# Patient Record
Sex: Female | Born: 1980
Health system: Southern US, Community
[De-identification: ages and names within clinical notes are randomized; demographics above are authoritative.]

## PROBLEM LIST (undated history)

## (undated) ENCOUNTER — Inpatient Hospital Stay (HOSPITAL_COMMUNITY): Payer: Self-pay

## (undated) ENCOUNTER — Emergency Department: Discharge status: Absent without leave | Payer: Self-pay

## (undated) DIAGNOSIS — G43909 Migraine, unspecified, not intractable, without status migrainosus: Secondary | ICD-10-CM

## (undated) DIAGNOSIS — E039 Hypothyroidism, unspecified: Secondary | ICD-10-CM

## (undated) DIAGNOSIS — F32A Depression, unspecified: Secondary | ICD-10-CM

## (undated) DIAGNOSIS — O1490 Unspecified pre-eclampsia, unspecified trimester: Secondary | ICD-10-CM

## (undated) DIAGNOSIS — F419 Anxiety disorder, unspecified: Secondary | ICD-10-CM

## (undated) DIAGNOSIS — F329 Major depressive disorder, single episode, unspecified: Secondary | ICD-10-CM

## (undated) HISTORY — PX: TONSILECTOMY/ADENOIDECTOMY WITH MYRINGOTOMY: SHX6125

## (undated) HISTORY — DX: Major depressive disorder, single episode, unspecified: F32.9

## (undated) HISTORY — DX: Depression, unspecified: F32.A

## (undated) HISTORY — DX: Anxiety disorder, unspecified: F41.9

## (undated) HISTORY — DX: Migraine, unspecified, not intractable, without status migrainosus: G43.909

## (undated) HISTORY — DX: Unspecified pre-eclampsia, unspecified trimester: O14.90

---

## 2004-07-18 ENCOUNTER — Encounter: Admission: RE | Admit: 2004-07-18 | Discharge: 2004-07-18 | Payer: Self-pay | Admitting: Pediatrics

## 2005-03-28 ENCOUNTER — Other Ambulatory Visit: Admission: RE | Admit: 2005-03-28 | Discharge: 2005-03-28 | Payer: Self-pay | Admitting: *Deleted

## 2006-05-30 ENCOUNTER — Other Ambulatory Visit: Admission: RE | Admit: 2006-05-30 | Discharge: 2006-05-30 | Payer: Self-pay | Admitting: Family Medicine

## 2006-08-29 ENCOUNTER — Emergency Department (HOSPITAL_COMMUNITY): Admission: EM | Admit: 2006-08-29 | Discharge: 2006-08-29 | Payer: Self-pay | Admitting: Emergency Medicine

## 2006-09-16 ENCOUNTER — Encounter: Admission: RE | Admit: 2006-09-16 | Discharge: 2006-09-16 | Payer: Self-pay | Admitting: Gastroenterology

## 2007-06-01 ENCOUNTER — Other Ambulatory Visit: Admission: RE | Admit: 2007-06-01 | Discharge: 2007-06-01 | Payer: Self-pay | Admitting: Family Medicine

## 2008-01-01 IMAGING — CT CT ENTERO ABD W/CM
2 of 5 series · 17 of 46 positions shown, 19 images · IV contrast (VOLUMEN & [ID] OMNI 300)
Comparison: None.

ABDOMEN CT WITH CONTRAST: (Performed using CT Enterography protocol)

CLINICAL DATA: Abdominal pain. Question Crohn's disease.
TECHNIQUE: Multidetector CT imaging of the abdomen and pelvis was performed
following administration of 6882cc of VoLumen low attenuation enteric contrast. 
Images were acquired after bolus injection of nonionic intravenous contrast with
coronal and sagittal reformations reviewed for assessment of the small bowel
mucosa.

Contrast:  100 cc Omnipaque 300

[Series 4: enterography · axial · 0.62mm/px · z∈[-436,+32]mm · 14 of 186 slices shown, 16 images]
[im 8/186  soft-tissue]
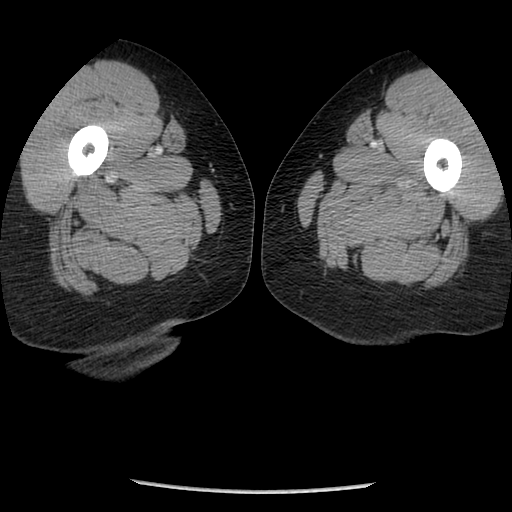
[im 8/186  bone]
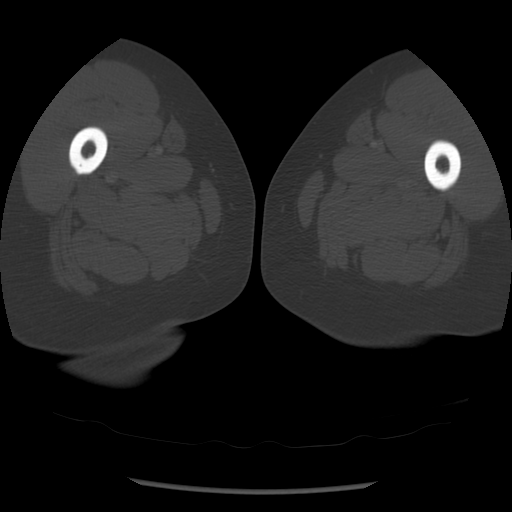
[im 24/186  soft-tissue]
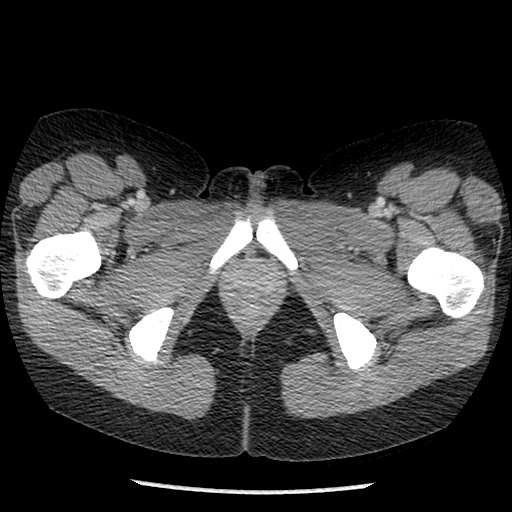
[im 39/186  soft-tissue]
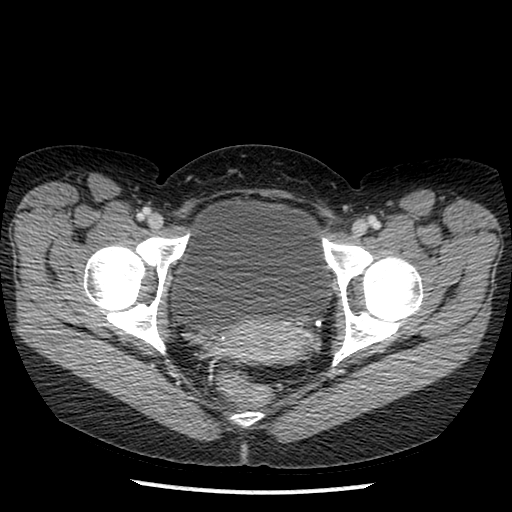
[im 47/186  soft-tissue]
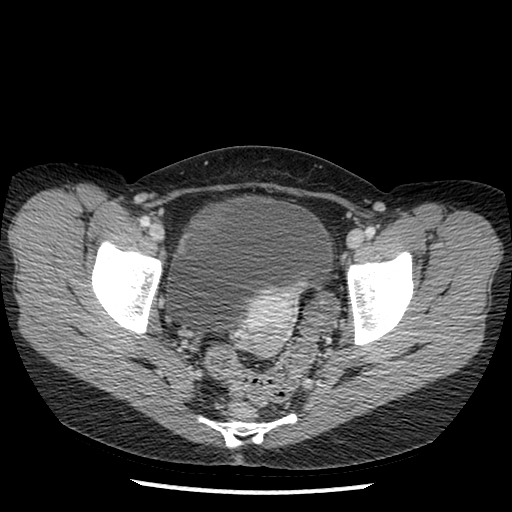
[im 62/186  soft-tissue]
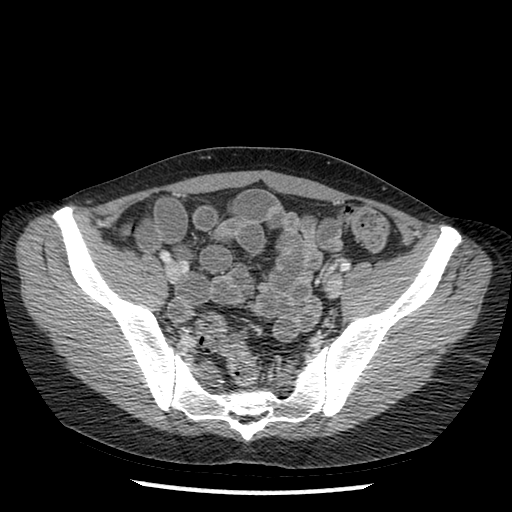
[im 78/186  soft-tissue]
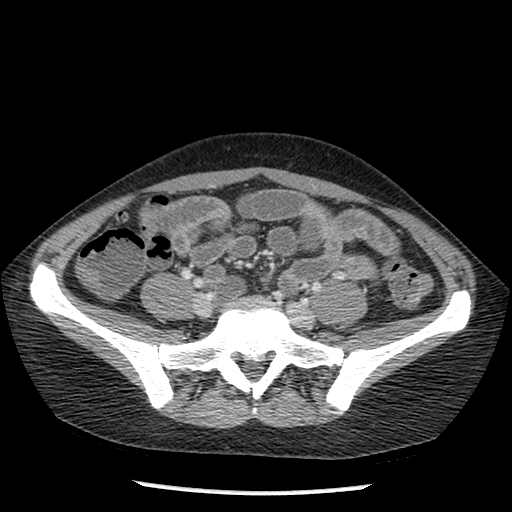
[im 85/186  soft-tissue]
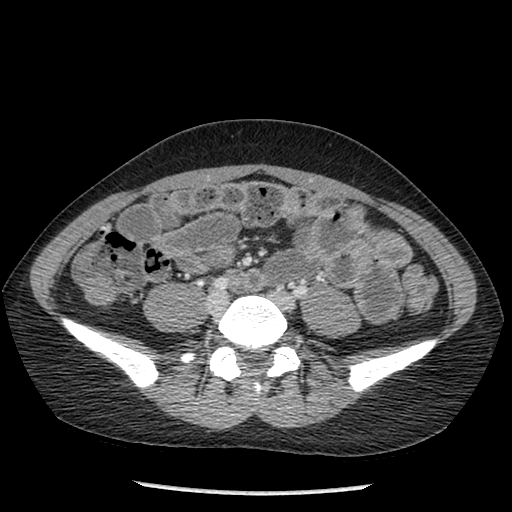
[im 101/186  soft-tissue]
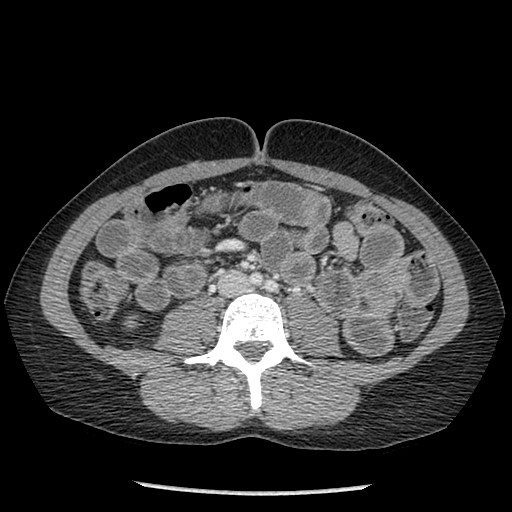
[im 108/186  soft-tissue]
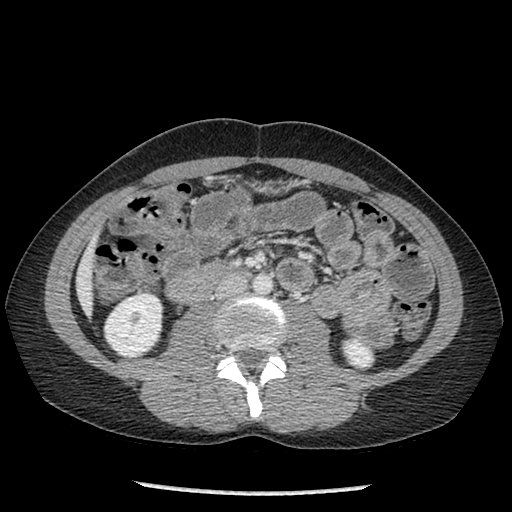
[im 108/186  bone]
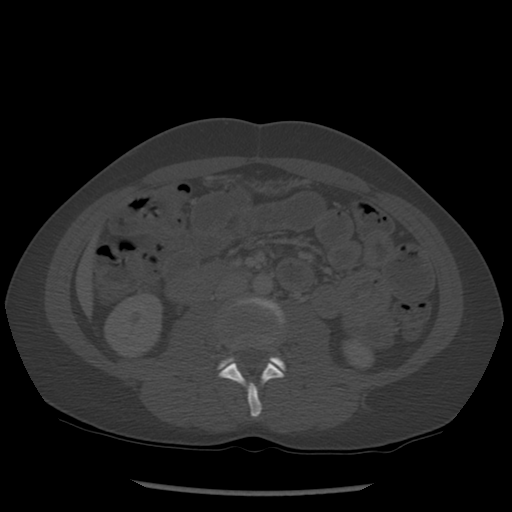
[im 124/186  soft-tissue]
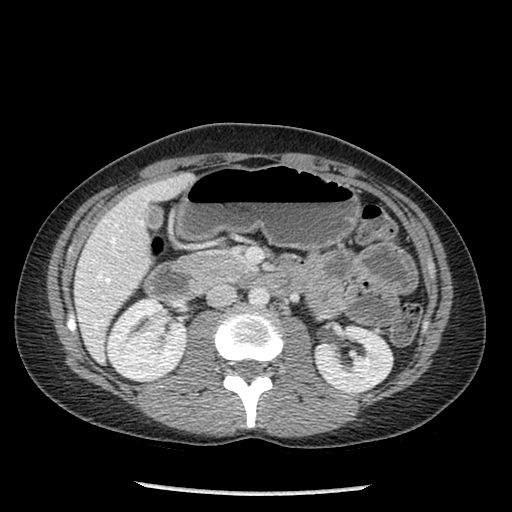
[im 139/186  soft-tissue]
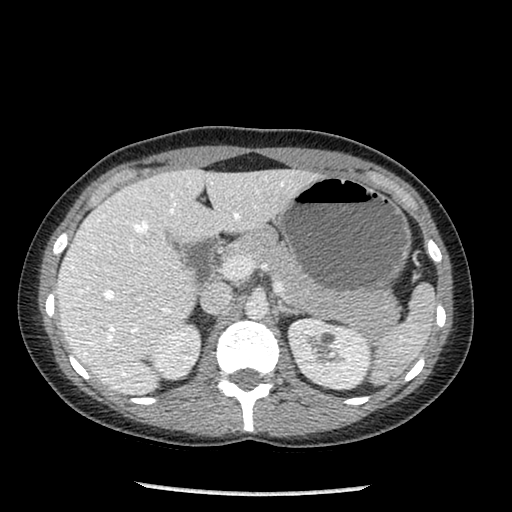
[im 147/186  soft-tissue]
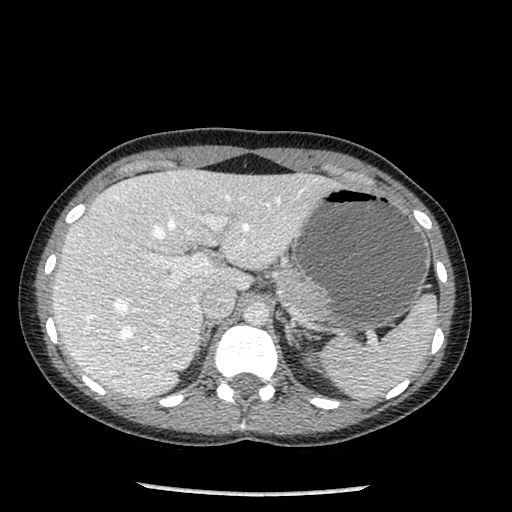
[im 162/186  soft-tissue]
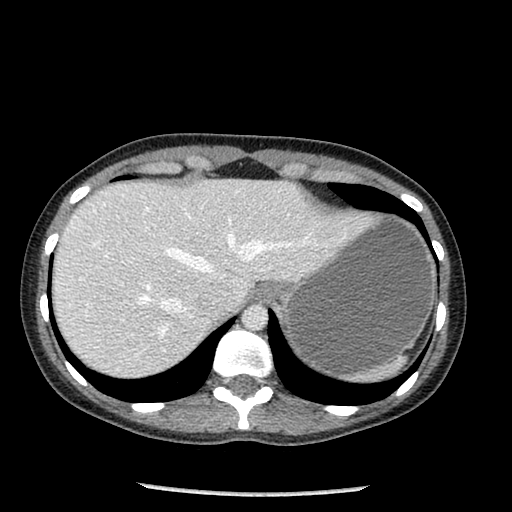
[im 178/186  soft-tissue]
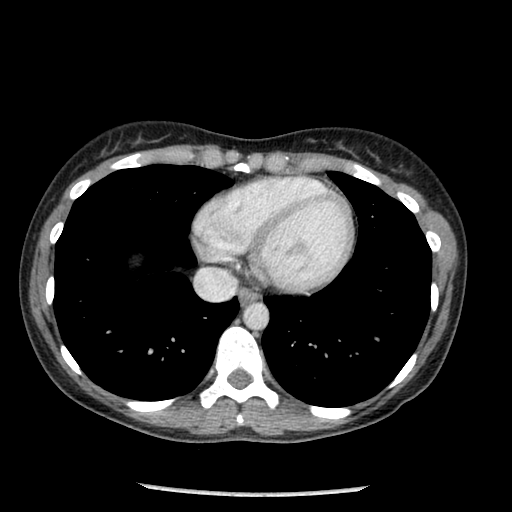

[Series 602: sagittal body · sagittal · 1.00mm/px · 3 of 129 slices shown]
[im 43/129  soft-tissue]
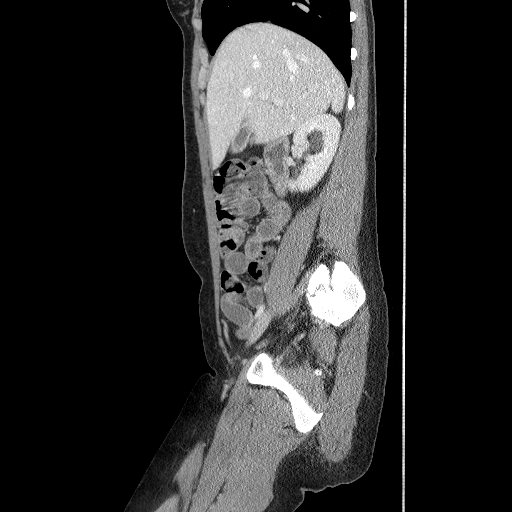
[im 57/129  soft-tissue]
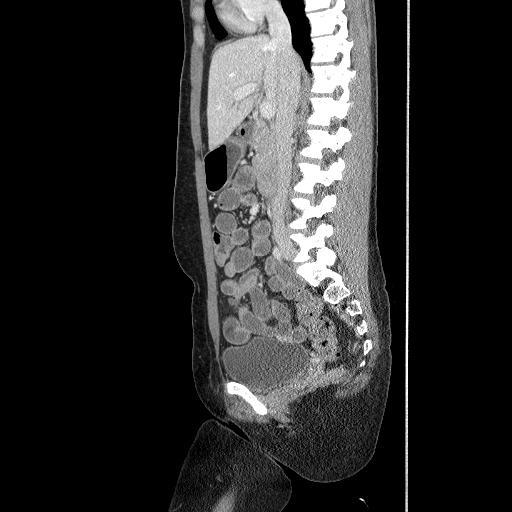
[im 72/129  soft-tissue]
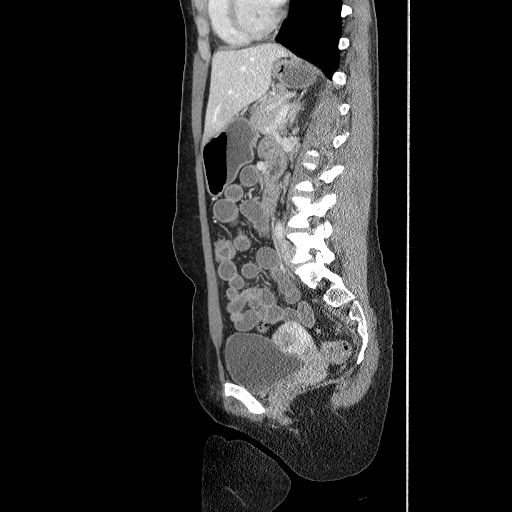

[17 of 46 positions shown; findings below may reference images not displayed]

FINDINGS: Distention of the small bowel is excellent throughout. There is no
abnormal enhancement of the small bowel. There is no small bowel wall
thickening. Specifically, the terminal ileum is normal. 

No focal abnormality is seen in the liver or spleen. The stomach, duodenum,
pancreas, gallbladder, adrenal glands, and kidneys have normal imaging features.
There is no intraperitoneal free fluid. No abdominal lymphadenopathy.
IMPRESSION: Normal exam. Specifically, there is no evidence for mucosal or transmural
hyperenhancement within the large or small bowel loops to suggest inflammatory
bowel disease. There is no bowel wall thickening.

PELVIS CT WITH CONTRAST:  (Performed using CT Enterography protocol)
FINDINGS: No pelvic lymphadenopathy. No intraperitoneal free fluid. There is no
adnexal mass. Bladder is unremarkable.
IMPRESSION: Normal study.

## 2008-11-01 ENCOUNTER — Encounter: Admission: RE | Admit: 2008-11-01 | Discharge: 2008-11-01 | Payer: Self-pay | Admitting: Family Medicine

## 2008-12-02 ENCOUNTER — Other Ambulatory Visit: Admission: RE | Admit: 2008-12-02 | Discharge: 2008-12-02 | Payer: Self-pay | Admitting: Family Medicine

## 2011-01-03 LAB — CBC
HCT: 43.2
Hemoglobin: 14.7
MCHC: 33.9
MCV: 91.4
Platelets: 317
RDW: 12.7

## 2011-01-03 LAB — COMPREHENSIVE METABOLIC PANEL
ALT: 18
AST: 24
Alkaline Phosphatase: 28 — ABNORMAL LOW
Calcium: 9.4
GFR calc Af Amer: >60
GFR calc non Af Amer: >60
Potassium: 3.9
Sodium: 139
Total Bilirubin: 0.5
Total Protein: 7.7

## 2011-01-03 LAB — POCT PREGNANCY, URINE: Operator id: 28833

## 2011-01-03 LAB — LIPASE, BLOOD: Lipase: 40

## 2011-01-03 LAB — URINALYSIS, ROUTINE W REFLEX MICROSCOPIC
Bilirubin Urine: NEGATIVE
Glucose, UA: NEGATIVE
Ketones, ur: NEGATIVE
Urobilinogen, UA: 0.2

## 2011-01-03 LAB — DIFFERENTIAL
Basophils Relative: 0
Eosinophils Absolute: 0.1

## 2011-01-03 LAB — WET PREP, GENITAL: Yeast Wet Prep HPF POC: NONE SEEN

## 2011-01-03 LAB — GC/CHLAMYDIA PROBE AMP, GENITAL: GC Probe Amp, Genital: NEGATIVE

## 2011-06-05 ENCOUNTER — Other Ambulatory Visit: Payer: Self-pay | Admitting: Obstetrics and Gynecology

## 2011-06-05 ENCOUNTER — Other Ambulatory Visit (HOSPITAL_COMMUNITY)
Admission: RE | Admit: 2011-06-05 | Discharge: 2011-06-05 | Disposition: A | Discharge status: Home / Self Care | Payer: BC Managed Care – PPO | Source: Ambulatory Visit | Attending: Obstetrics and Gynecology | Admitting: Obstetrics and Gynecology

## 2011-06-05 DIAGNOSIS — Z01419 Encounter for gynecological examination (general) (routine) without abnormal findings: Secondary | ICD-10-CM | POA: Insufficient documentation

## 2012-04-23 LAB — VITAMIN B12: Vitamin B-12: >1500

## 2013-03-01 LAB — CBC AND DIFFERENTIAL
HEMATOCRIT: 39 % (ref 36–46)
HEMOGLOBIN: 13.1 g/dL (ref 12.0–16.0)
Platelets: 304 10*3/uL (ref 150–399)
WBC: 9.4 10*3/mL

## 2013-03-01 LAB — TSH: TSH: 1.5 u[IU]/mL (ref 0.41–5.90)

## 2013-10-27 ENCOUNTER — Ambulatory Visit (INDEPENDENT_AMBULATORY_CARE_PROVIDER_SITE_OTHER): Payer: 59 | Admitting: Physician Assistant

## 2013-10-27 ENCOUNTER — Encounter: Payer: Self-pay | Admitting: Physician Assistant

## 2013-10-27 VITALS — BP 126/89 | HR 89 | Ht 64.0 in | Wt 133.0 lb

## 2013-10-27 DIAGNOSIS — K589 Irritable bowel syndrome without diarrhea: Secondary | ICD-10-CM | POA: Insufficient documentation

## 2013-10-27 DIAGNOSIS — Z13220 Encounter for screening for lipoid disorders: Secondary | ICD-10-CM

## 2013-10-27 DIAGNOSIS — R5383 Other fatigue: Secondary | ICD-10-CM

## 2013-10-27 DIAGNOSIS — Z23 Encounter for immunization: Secondary | ICD-10-CM

## 2013-10-27 DIAGNOSIS — E039 Hypothyroidism, unspecified: Secondary | ICD-10-CM

## 2013-10-27 DIAGNOSIS — R5381 Other malaise: Secondary | ICD-10-CM

## 2013-10-27 DIAGNOSIS — Z131 Encounter for screening for diabetes mellitus: Secondary | ICD-10-CM

## 2013-10-27 HISTORY — DX: Hypothyroidism, unspecified: E03.9

## 2013-10-27 LAB — COMPLETE METABOLIC PANEL WITH GFR
ALBUMIN: 5.1 g/dL (ref 3.5–5.2)
ALT: 60 U/L — AB (ref 0–35)
AST: 73 U/L — ABNORMAL HIGH (ref 0–37)
Alkaline Phosphatase: 42 U/L (ref 39–117)
BILIRUBIN TOTAL: 0.7 mg/dL (ref 0.2–1.2)
BUN: 9 mg/dL (ref 6–23)
CO2: 25 mEq/L (ref 19–32)
Calcium: 10 mg/dL (ref 8.4–10.5)
Chloride: 98 mEq/L (ref 96–112)
Creat: 0.83 mg/dL (ref 0.50–1.10)
GFR, Est Non African American: >89 mL/min
GLUCOSE: 88 mg/dL (ref 70–99)
Potassium: 4.4 mEq/L (ref 3.5–5.3)
SODIUM: 136 meq/L (ref 135–145)
TOTAL PROTEIN: 7.7 g/dL (ref 6.0–8.3)

## 2013-10-27 LAB — T4, FREE: FREE T4: 1.51 ng/dL (ref 0.80–1.80)

## 2013-10-27 LAB — LIPID PANEL
Cholesterol: 304 mg/dL — ABNORMAL HIGH (ref 0–200)
HDL: 91 mg/dL (ref >39)
Total CHOL/HDL Ratio: 3.3 Ratio
Triglycerides: 1323 mg/dL — ABNORMAL HIGH (ref <150)

## 2013-10-27 LAB — TSH: TSH: 1.597 u[IU]/mL (ref 0.350–4.500)

## 2013-10-27 NOTE — Progress Notes (Signed)
   Subjective:    Patient ID: Melissa Sharp, female    DOB: 06/24/1980, 33 y.o.   MRN: [id number]  HPI Pt is a 33 yo female who presents to the clinic to establish care.  Patient has an active problem list of thyroid activity decreased and IBS. She currently takes levothyroxin 75 MCG daily. She does not need a refill today but will soon be out. She does feel like she's been a little more sluggish than she usually is and having a few more hot flashes.  .. Family History  Problem Relation Age of Onset  . Alcohol abuse Father    .Marland Kitchen. History   Social History  . Marital Status: Married    Spouse Name: N/A    Number of Children: N/A  . Years of Education: N/A   Occupational History  . Not on file.   Social History Main Topics  . Smoking status: Never Smoker   . Smokeless tobacco: Not on file  . Alcohol Use: Yes  . Drug Use: No  . Sexual Activity: Yes    Birth Control/ Protection: Condom   Other Topics Concern  . Not on file   Social History Narrative  . No narrative on file      Review of Systems  All other systems reviewed and are negative.      Objective:   Physical Exam  Constitutional: She is oriented to person, place, and time. She appears well-developed and well-nourished.  HENT:  Head: Normocephalic and atraumatic.  Neck: Normal range of motion. Neck supple. No thyromegaly present.  Cardiovascular: Normal rate, regular rhythm and normal heart sounds.   Pulmonary/Chest: Effort normal and breath sounds normal. She has no wheezes.  Neurological: She is alert and oriented to person, place, and time.  Skin: Skin is dry.  Psychiatric: She has a normal mood and affect. Her behavior is normal.          Assessment & Plan:  Thyroid activity decrease/hyperthyroidism-we'll check TSH and T4 today. Will adjust medications accordingly.  Tetanus shot given today without any complications.  Discussed with patient need for Pap smear.  Will go ahead and order  fasting labs. Discussed with patient she needs to not have eaten or drink and anything for 8 hours.

## 2013-10-28 LAB — VITAMIN D 25 HYDROXY (VIT D DEFICIENCY, FRACTURES): Vit D, 25-Hydroxy: 66 ng/mL (ref 30–89)

## 2013-10-28 LAB — VITAMIN B12: Vitamin B-12: 343 pg/mL (ref 211–911)

## 2013-10-29 ENCOUNTER — Other Ambulatory Visit: Payer: Self-pay | Admitting: Physician Assistant

## 2013-10-29 ENCOUNTER — Encounter: Payer: Self-pay | Admitting: Physician Assistant

## 2013-10-29 DIAGNOSIS — G43009 Migraine without aura, not intractable, without status migrainosus: Secondary | ICD-10-CM | POA: Insufficient documentation

## 2013-10-29 DIAGNOSIS — E538 Deficiency of other specified B group vitamins: Secondary | ICD-10-CM | POA: Insufficient documentation

## 2013-10-29 DIAGNOSIS — F339 Major depressive disorder, recurrent, unspecified: Secondary | ICD-10-CM | POA: Insufficient documentation

## 2013-10-29 DIAGNOSIS — K219 Gastro-esophageal reflux disease without esophagitis: Secondary | ICD-10-CM | POA: Insufficient documentation

## 2013-10-29 DIAGNOSIS — E781 Pure hyperglyceridemia: Secondary | ICD-10-CM | POA: Insufficient documentation

## 2013-10-29 DIAGNOSIS — F418 Other specified anxiety disorders: Secondary | ICD-10-CM | POA: Insufficient documentation

## 2013-10-29 MED ORDER — ICOSAPENT ETHYL 1 G PO CAPS: 2.0000 g | ORAL_CAPSULE | Freq: Two times a day (BID) | ORAL | Status: DC

## 2013-11-02 ENCOUNTER — Encounter: Payer: Self-pay | Admitting: Physician Assistant

## 2013-11-02 DIAGNOSIS — R748 Abnormal levels of other serum enzymes: Secondary | ICD-10-CM | POA: Insufficient documentation

## 2013-11-04 ENCOUNTER — Telehealth: Payer: Self-pay | Admitting: *Deleted

## 2013-11-04 NOTE — Telephone Encounter (Signed)
Spoke with Monique @ [facility] 1 year approval obtained for vascepa. [id number]  Good thru 11/05/14. Voicemail left for patient and spoke with pharmacy. Corliss SkainsJamie Aubriauna Riner, CMA

## 2013-11-07 ENCOUNTER — Other Ambulatory Visit: Payer: Self-pay | Admitting: Physician Assistant

## 2013-11-08 ENCOUNTER — Encounter: Payer: Self-pay | Admitting: *Deleted

## 2013-11-08 ENCOUNTER — Telehealth: Payer: Self-pay | Admitting: *Deleted

## 2013-11-08 ENCOUNTER — Encounter: Payer: Self-pay | Admitting: Physician Assistant

## 2013-11-08 NOTE — Telephone Encounter (Signed)
Yes drinking heavily could cause liver enzymes to be elevated. Would still like to check in next couple of weeks.   Will you please add to family hx high TG.

## 2013-11-08 NOTE — Telephone Encounter (Signed)
Pt left vm stating that a few days before we did blood work, she had drank heavily & ate horribly because she was on vacation.  She wants to know if this could contribute to her elevated enzymes.  Also she found out & wanted you to know that elevated liver enzymes and high triglycerides run in her family.

## 2013-11-24 ENCOUNTER — Telehealth: Payer: Self-pay | Admitting: *Deleted

## 2013-11-24 DIAGNOSIS — R748 Abnormal levels of other serum enzymes: Secondary | ICD-10-CM

## 2013-11-24 NOTE — Telephone Encounter (Signed)
CMP ordered to recheck liver enzymes. 

## 2013-12-02 LAB — COMPLETE METABOLIC PANEL WITH GFR
ALK PHOS: 37 U/L — AB (ref 39–117)
ALT: 29 U/L (ref 0–35)
AST: 27 U/L (ref 0–37)
Albumin: 5.1 g/dL (ref 3.5–5.2)
BILIRUBIN TOTAL: 0.8 mg/dL (ref 0.2–1.2)
BUN: 17 mg/dL (ref 6–23)
CHLORIDE: 99 meq/L (ref 96–112)
CO2: 27 meq/L (ref 19–32)
CREATININE: 0.82 mg/dL (ref 0.50–1.10)
Calcium: 10.5 mg/dL (ref 8.4–10.5)
GFR, Est African American: >89 mL/min
GFR, Est Non African American: >89 mL/min
Glucose, Bld: 85 mg/dL (ref 70–99)
Potassium: 4.5 mEq/L (ref 3.5–5.3)
SODIUM: 135 meq/L (ref 135–145)
Total Protein: 7.4 g/dL (ref 6.0–8.3)

## 2014-01-20 ENCOUNTER — Encounter: Payer: Self-pay | Admitting: Physician Assistant

## 2014-01-26 ENCOUNTER — Encounter: Payer: Self-pay | Admitting: Physician Assistant

## 2014-01-26 ENCOUNTER — Ambulatory Visit (INDEPENDENT_AMBULATORY_CARE_PROVIDER_SITE_OTHER): Payer: 59 | Admitting: Physician Assistant

## 2014-01-26 VITALS — BP 131/86 | HR 82 | Ht 64.0 in | Wt 133.0 lb

## 2014-01-26 DIAGNOSIS — E781 Pure hyperglyceridemia: Secondary | ICD-10-CM

## 2014-01-26 DIAGNOSIS — N926 Irregular menstruation, unspecified: Secondary | ICD-10-CM

## 2014-01-26 LAB — POCT URINE PREGNANCY: Preg Test, Ur: NEGATIVE

## 2014-01-26 LAB — LIPID PANEL
CHOL/HDL RATIO: 2.7 ratio
Cholesterol: 315 mg/dL — ABNORMAL HIGH (ref 0–200)
HDL: 116 mg/dL (ref >39)
LDL Cholesterol: 119 mg/dL — ABNORMAL HIGH (ref 0–99)
Triglycerides: 398 mg/dL — ABNORMAL HIGH (ref <150)
VLDL: 80 mg/dL — ABNORMAL HIGH (ref 0–40)

## 2014-01-26 MED ORDER — BUPROPION HCL ER (XL) 150 MG PO TB24
150.0000 mg | ORAL_TABLET | Freq: Every day | ORAL | Status: DC
Start: 2014-01-26 — End: 2014-09-12

## 2014-01-26 MED ORDER — FLUOXETINE HCL 10 MG PO CAPS: 10.0000 mg | ORAL_CAPSULE | Freq: Two times a day (BID) | ORAL | Status: DC

## 2014-01-26 NOTE — Progress Notes (Signed)
   Subjective:    Patient ID: Melissa Sharp, female    DOB: 07/18/1980, 33 y.o.   MRN: [id number]  HPI Pt presents to the clinic for 3 month follow up on hypertriglyceridemia. She has made diet changes to decrease carbs and fatty foods. She has not lost any weight. She has started exercise routine at least 3-4 times a week. She does feel better. Taking vascepa twice a day.   She is trying to get pregnant and 1 week late from period.    Review of Systems  All other systems reviewed and are negative.      Objective:   Physical Exam  Constitutional: She is oriented to person, place, and time. She appears well-developed and well-nourished.  HENT:  Head: Normocephalic and atraumatic.  Cardiovascular: Normal rate, regular rhythm and normal heart sounds.   Pulmonary/Chest: Effort normal and breath sounds normal.  Neurological: She is alert and oriented to person, place, and time.  Psychiatric: She has a normal mood and affect. Her behavior is normal.          Assessment & Plan:  Hypertriglyceridemia- will recheck today. Since exercising, changed diet and went on vascepa. Pt is actively trying to get pregnant. Will call OB and see if vascepa ok during pregnancy. Category C medication.   Missed period- UPT negative today. Pregnancy counseling and encouragement given. Discussed STOP xanax. Will find out about vascepa. Start prenatal vitamin.

## 2014-03-03 ENCOUNTER — Other Ambulatory Visit: Payer: Self-pay | Admitting: Physician Assistant

## 2014-03-07 ENCOUNTER — Telehealth: Payer: Self-pay | Admitting: *Deleted

## 2014-03-07 NOTE — Telephone Encounter (Signed)
To note vascepa is ok to take during pregnancy. OTC fish oil 4000mg  daily best thing until can get back on vascepa but vascepa is much more concentrated.

## 2014-03-07 NOTE — Telephone Encounter (Signed)
Pt left a vm stating that she has recently lost her ins & can't afford the vascepa now because it's $178 for a months supply.  She wants to know if there is anything OTC that she can pick up to take until she can get ins again.

## 2014-03-08 NOTE — Telephone Encounter (Signed)
Left information on vm

## 2014-04-27 ENCOUNTER — Ambulatory Visit: Payer: 59 | Admitting: Physician Assistant

## 2014-06-23 ENCOUNTER — Other Ambulatory Visit: Payer: Self-pay | Admitting: Physician Assistant

## 2014-06-27 ENCOUNTER — Ambulatory Visit: Payer: 59 | Admitting: Physician Assistant

## 2014-08-13 ENCOUNTER — Other Ambulatory Visit: Payer: Self-pay | Admitting: Physician Assistant

## 2014-09-09 ENCOUNTER — Other Ambulatory Visit: Payer: Self-pay | Admitting: Physician Assistant

## 2014-09-10 ENCOUNTER — Other Ambulatory Visit: Payer: Self-pay | Admitting: Physician Assistant

## 2014-09-12 ENCOUNTER — Other Ambulatory Visit: Payer: Self-pay | Admitting: *Deleted

## 2014-09-12 MED ORDER — LEVOTHYROXINE SODIUM 75 MCG PO TABS: ORAL_TABLET | ORAL | Status: DC

## 2014-09-12 MED ORDER — BUPROPION HCL ER (XL) 150 MG PO TB24: 150.0000 mg | ORAL_TABLET | Freq: Every day | ORAL | Status: DC

## 2014-10-04 ENCOUNTER — Ambulatory Visit: Payer: Self-pay | Admitting: Family Medicine

## 2014-10-11 ENCOUNTER — Other Ambulatory Visit: Payer: Self-pay | Admitting: *Deleted

## 2014-10-11 MED ORDER — LEVOTHYROXINE SODIUM 75 MCG PO TABS: ORAL_TABLET | ORAL | Status: DC

## 2014-10-19 ENCOUNTER — Ambulatory Visit: Payer: Self-pay | Admitting: Family Medicine

## 2014-11-01 ENCOUNTER — Ambulatory Visit (INDEPENDENT_AMBULATORY_CARE_PROVIDER_SITE_OTHER): Payer: Self-pay | Admitting: Family Medicine

## 2014-11-01 ENCOUNTER — Ambulatory Visit: Payer: Self-pay | Admitting: Family Medicine

## 2014-11-01 ENCOUNTER — Encounter: Payer: Self-pay | Admitting: Family Medicine

## 2014-11-01 VITALS — BP 159/101 | HR 80 | Wt 137.0 lb

## 2014-11-01 DIAGNOSIS — E039 Hypothyroidism, unspecified: Secondary | ICD-10-CM

## 2014-11-01 NOTE — Progress Notes (Signed)
CC: Melissa Sharp is a 34 y.o. female is here for f/u meds   Subjective: HPI:  Patient returns for follow-up of hypothyroidism: She is currently taking 75 MCG of levothyroxine daily. Review of systems is positive only for a fatigue to a mild degree. There's been no unintentional weight loss or gain, GI disturbance, nor hair or skin changes. She's not had TSH checked in about a year.   Review Of Systems Outlined In HPI  No past medical history on file.  Past Surgical History  Procedure Laterality Date  . Tonsilectomy/adenoidectomy with myringotomy     Family History  Problem Relation Age of Onset  . Alcohol abuse Father   . Hyperlipidemia      Social History   Social History  . Marital Status: Married    Spouse Name: N/A  . Number of Children: N/A  . Years of Education: N/A   Occupational History  . Not on file.   Social History Main Topics  . Smoking status: Never Smoker   . Smokeless tobacco: Not on file  . Alcohol Use: Yes  . Drug Use: No  . Sexual Activity: Yes    Birth Control/ Protection: Condom   Other Topics Concern  . Not on file   Social History Narrative     Objective: BP 159/101 mmHg  Pulse 80  Wt 137 lb (62.143 kg)  SpO2 100%  General: Alert and Oriented, No Acute Distress HEENT: Pupils equal, round, reactive to light. Conjunctivae clear.  Moist mucous membranes Lungs: Clear to auscultation bilaterally, no wheezing/ronchi/rales.  Comfortable work of breathing. Good air movement. Cardiac: Regular rate and rhythm. Normal S1/S2.  No murmurs, rubs, nor gallops.   Extremities: No peripheral edema.  Strong peripheral pulses.  Mental Status: No depression, anxiety, nor agitation. Skin: Warm and dry.  Assessment & Plan: Melissa Sharp was seen today for f/u meds.  Diagnoses and all orders for this visit:  Hypothyroidism, unspecified hypothyroidism type -     TSH   Hypothyroidism: Questionably uncontrolled with her symptoms of fatigue therefore I  believe it would be worth getting a TSH before refilling levothyroxine. Refills will be provided based on the above results.  Return if symptoms worsen or fail to improve.

## 2014-11-02 ENCOUNTER — Telehealth: Payer: Self-pay | Admitting: Family Medicine

## 2014-11-02 LAB — TSH: TSH: 1.561 u[IU]/mL (ref 0.350–4.500)

## 2014-11-02 MED ORDER — LEVOTHYROXINE SODIUM 75 MCG PO TABS: ORAL_TABLET | ORAL | Status: DC

## 2014-11-02 NOTE — Telephone Encounter (Signed)
Pt.notified

## 2014-11-02 NOTE — Telephone Encounter (Signed)
Sue Lush, Will you please let patinet know that her thyroid supplementation appears to be adequate, I'll send refills of levothyroxine to her [facility] on [address].

## 2014-11-17 ENCOUNTER — Ambulatory Visit (INDEPENDENT_AMBULATORY_CARE_PROVIDER_SITE_OTHER): Payer: BLUE CROSS/BLUE SHIELD | Admitting: Family Medicine

## 2014-11-17 ENCOUNTER — Encounter: Payer: Self-pay | Admitting: Family Medicine

## 2014-11-17 VITALS — BP 141/98 | HR 96 | Temp 98.0°F | Wt 138.0 lb

## 2014-11-17 DIAGNOSIS — A499 Bacterial infection, unspecified: Secondary | ICD-10-CM | POA: Diagnosis not present

## 2014-11-17 DIAGNOSIS — J329 Chronic sinusitis, unspecified: Secondary | ICD-10-CM | POA: Diagnosis not present

## 2014-11-17 DIAGNOSIS — B9689 Other specified bacterial agents as the cause of diseases classified elsewhere: Secondary | ICD-10-CM

## 2014-11-17 MED ORDER — AMOXICILLIN-POT CLAVULANATE 500-125 MG PO TABS: ORAL_TABLET | ORAL | Status: AC

## 2014-11-17 MED ORDER — BENZONATATE 200 MG PO CAPS: 200.0000 mg | ORAL_CAPSULE | Freq: Two times a day (BID) | ORAL | Status: DC | PRN

## 2014-11-17 NOTE — Progress Notes (Signed)
CC: Melissa Sharp is a 34 y.o. female is here for Sinusitis   Subjective: HPI:  Complains of fatigue, subjective fevers, nonproductive cough but most bothersome is her congestion and pressure in the cheeks. Symptoms have been present ever since the beginning of last week. No benefit from pseudoephedrine, guaifenesin, nor rest. Symptoms seem to worsen on a daily basis now moderate in severity. Symptoms are worse with trying to talk. Abdominal symptoms makes symptoms better or worse. She denies wheezing, chest pain, abdominal pain, confusion, nor rash. No sore throat or trouble swallowing   Review Of Systems Outlined In HPI  No past medical history on file.  Past Surgical History  Procedure Laterality Date  . Tonsilectomy/adenoidectomy with myringotomy     Family History  Problem Relation Age of Onset  . Alcohol abuse Father   . Hyperlipidemia      Social History   Social History  . Marital Status: Married    Spouse Name: N/A  . Number of Children: N/A  . Years of Education: N/A   Occupational History  . Not on file.   Social History Main Topics  . Smoking status: Never Smoker   . Smokeless tobacco: Not on file  . Alcohol Use: Yes  . Drug Use: No  . Sexual Activity: Yes    Birth Control/ Protection: Condom   Other Topics Concern  . Not on file   Social History Narrative     Objective: BP 141/98 mmHg  Pulse 96  Temp(Src) 98 F (36.7 C) (Oral)  Wt 138 lb (62.596 kg)  General: Alert and Oriented, No Acute Distress HEENT: Pupils equal, round, reactive to light. Conjunctivae clear.  External ears unremarkable, canals clear with intact TMs with appropriate landmarks.  Middle ear appears open without effusion. Pink inferior turbinates.  Moist mucous membranes, pharynx without inflammation nor lesions.  Neck supple without palpable lymphadenopathy nor abnormal masses. Lungs: Clear to auscultation bilaterally, no wheezing/ronchi/rales.  Comfortable work of breathing. Good  air movement. Frequent coughing Extremities: No peripheral edema.  Strong peripheral pulses.  Mental Status: No depression, anxiety, nor agitation. Skin: Warm and dry.  Assessment & Plan: Melissa Sharp was seen today for sinusitis.  Diagnoses and all orders for this visit:  Bacterial sinusitis -     amoxicillin-clavulanate (AUGMENTIN) 500-125 MG per tablet; Take one by mouth every 8 hours for ten total days. -     benzonatate (TESSALON) 200 MG capsule; Take 1 capsule (200 mg total) by mouth 2 (two) times daily as needed for cough.    Suspect bacterial sinusitis with postnasal drip causing cough therefore start Augmentin and Tessalon Perles. Consider nasal saline washes.   Return if symptoms worsen or fail to improve.

## 2014-11-22 ENCOUNTER — Telehealth: Payer: Self-pay | Admitting: Physician Assistant

## 2014-11-22 NOTE — Telephone Encounter (Signed)
Received fax from the call center, Pt contacted them stating she was not feeling any better after her acute visit. Pt states she had a "throbbing forehead headache, cant hear out of left ear." Reports no fever.  Pt was unavailable, left voicemail to return clinic call to see how Pt was doing. If not better, we will schedule a f/u acute visit.

## 2014-11-24 ENCOUNTER — Telehealth: Payer: Self-pay | Admitting: Physician Assistant

## 2014-11-24 NOTE — Telephone Encounter (Signed)
Received fax for Vascepa sent through cover my meds waiting on prior authorization. - CF

## 2014-11-28 NOTE — Telephone Encounter (Signed)
Checked on prior authorization on Cover my meds and coverage for Vascepa has been denied. - CF

## 2014-11-29 ENCOUNTER — Telehealth: Payer: Self-pay | Admitting: Physician Assistant

## 2014-11-29 NOTE — Telephone Encounter (Signed)
Received fax for prior authorization on Vascepa sent through cover my meds waiting on authorization. - CF °

## 2014-11-30 NOTE — Telephone Encounter (Signed)
Received a fax from [facility] and they denied coverage on Vascepa due to patient has not tried 2 formulary alternatives.  Reference # [id number] - CF

## 2014-12-05 ENCOUNTER — Other Ambulatory Visit: Payer: Self-pay | Admitting: *Deleted

## 2014-12-05 MED ORDER — OMEGA-3-ACID ETHYL ESTERS 1 G PO CAPS: 2.0000 g | ORAL_CAPSULE | Freq: Two times a day (BID) | ORAL | Status: DC

## 2015-01-04 LAB — CBC AND DIFFERENTIAL
HEMATOCRIT: 41 % (ref 36–46)
Hemoglobin: 13.5 g/dL (ref 12.0–16.0)
PLATELETS: 444 10*3/uL — AB (ref 150–399)
WBC: 8.9 10^3/mL

## 2015-01-04 LAB — OB RESULTS CONSOLE ABO/RH: RH Type: POSITIVE

## 2015-01-04 LAB — OB RESULTS CONSOLE HEPATITIS B SURFACE ANTIGEN: Hepatitis B Surface Ag: NEGATIVE

## 2015-01-04 LAB — TSH: TSH: 0.28 u[IU]/mL — AB (ref 0.41–5.90)

## 2015-01-04 LAB — OB RESULTS CONSOLE GC/CHLAMYDIA
Chlamydia: NEGATIVE
Gonorrhea: NEGATIVE

## 2015-01-13 ENCOUNTER — Telehealth: Payer: Self-pay | Admitting: *Deleted

## 2015-01-13 MED ORDER — LEVOTHYROXINE SODIUM 50 MCG PO TABS: ORAL_TABLET | ORAL | Status: DC

## 2015-01-13 NOTE — Telephone Encounter (Signed)
Low TSH and high T4 = will reduce from 75 --> 50 mcg and recheck labs 6 weeks, sooner if needed, thanks

## 2015-01-13 NOTE — Telephone Encounter (Signed)
Pt notified of results & rx. 

## 2015-01-13 NOTE — Telephone Encounter (Signed)
Pt had TSH checked by her OBGYN and the results were 0.285.  She is currently on . Can we adjust this for her?

## 2015-01-23 ENCOUNTER — Ambulatory Visit: Payer: BLUE CROSS/BLUE SHIELD | Admitting: Osteopathic Medicine

## 2015-01-24 ENCOUNTER — Encounter: Payer: Self-pay | Admitting: Physician Assistant

## 2015-01-24 ENCOUNTER — Ambulatory Visit (INDEPENDENT_AMBULATORY_CARE_PROVIDER_SITE_OTHER): Payer: BLUE CROSS/BLUE SHIELD | Admitting: Sports Medicine

## 2015-01-24 ENCOUNTER — Ambulatory Visit (INDEPENDENT_AMBULATORY_CARE_PROVIDER_SITE_OTHER): Payer: BLUE CROSS/BLUE SHIELD | Admitting: Physician Assistant

## 2015-01-24 VITALS — BP 114/75 | HR 80 | Temp 97.6°F | Wt 139.0 lb

## 2015-01-24 DIAGNOSIS — I878 Other specified disorders of veins: Secondary | ICD-10-CM

## 2015-01-24 DIAGNOSIS — Z331 Pregnant state, incidental: Secondary | ICD-10-CM | POA: Diagnosis not present

## 2015-01-24 DIAGNOSIS — Z3490 Encounter for supervision of normal pregnancy, unspecified, unspecified trimester: Secondary | ICD-10-CM | POA: Insufficient documentation

## 2015-01-24 DIAGNOSIS — A084 Viral intestinal infection, unspecified: Secondary | ICD-10-CM | POA: Insufficient documentation

## 2015-01-24 MED ORDER — PROMETHAZINE HCL 25 MG PO TABS: 25.0000 mg | ORAL_TABLET | Freq: Four times a day (QID) | ORAL | Status: DC | PRN

## 2015-01-24 NOTE — Assessment & Plan Note (Signed)
20-gauge angiocatheter placed in the right cubital vein. Further fluid management will be per Tandy GawJade Breeback, PA-C.

## 2015-01-24 NOTE — Progress Notes (Signed)
   Subjective:    Patient ID: Melissa Sharp, Melissa Sharp    DOB: 10/14/1980, 34 y.o.   MRN: [id number]  HPI  Patient is a 34 year old Melissa Sharp who presents to the clinic with 4 days of nausea, diarrhea, abdominal cramping. Symptoms started 2 AM on Saturday morning all of a sudden. She denies any fever does report some chills and sweats. She continues to have diarrhea about 6-8 times a day that she describes as watery and green. The last time she vomited was last night. She does feel like her stools are beginning to form. She denies any melena or hematochezia. She has tried Imodium and Tylenol with little results. She is [redacted] weeks pregnant. She denies any nausea or vomiting due to only pregnancy causes. She has had some exposure to sick coworkers. Denies any dysuria.      Review of Systems  All other systems reviewed and are negative.      Objective:   Physical Exam  Constitutional: She appears well-developed and well-nourished.  HENT:  Head: Normocephalic and atraumatic.  Cardiovascular: Normal rate, regular rhythm and normal heart sounds.   Pulmonary/Chest: Effort normal and breath sounds normal. She has no wheezes.  No CVA tenderness.   Abdominal: Soft. Bowel sounds are normal. She exhibits no distension and no mass. There is tenderness. There is no rebound and no guarding.  Mild diffuse tenderness over lower abdomen to palpation.   Skin: Skin is dry.  Psychiatric: She has a normal mood and affect. Her behavior is normal.          Assessment & Plan:  Viral gastroenteritis/pregnancy- vitals stable.  IV fluids 2L started by Dr. Yehuda Buddhekkendam. She had marked improvement after fluids. HO given for symptomatic care. Discussed BRAT diet. Phenergan given for nausea as needed. Discussed to stay hydrated. Written out of work for rest for 3 days. Immodium ok to continue. Red flags discussed. Follow up as needed or if symptoms don't improve or worsen.

## 2015-01-24 NOTE — Patient Instructions (Signed)

## 2015-01-24 NOTE — Progress Notes (Signed)
   Subjective:    I'm seeing this patient as a consultation for:  Tandy GawJade Breeback, PA-C  CC: Venous access and dehydration  HPI: This is a pleasant [age] year female with several days of nausea, vomiting, diarrhea, with dehydration, weakness. I was called for further evaluation and definitive management in establishing venous access. IV fluids are planned in the office.  Past medical history, Surgical history, Family history not pertinant except as noted below, Social history, Allergies, and medications have been entered into the medical record, reviewed, and no changes needed.   Review of Systems: No headache, visual changes, nausea, vomiting, diarrhea, constipation, dizziness, abdominal pain, skin rash, fevers, chills, night sweats, weight loss, swollen lymph nodes, body aches, joint swelling, muscle aches, chest pain, shortness of breath, mood changes, visual or auditory hallucinations.   Objective:   General: Well Developed, well nourished, and in no acute distress.  Neuro/Psych: Alert and oriented x3, extra-ocular muscles intact, able to move all 4 extremities, sensation grossly intact. Skin: Warm and dry, no rashes noted.  Respiratory: Not using accessory muscles, speaking in full sentences, trachea midline.  Cardiovascular: Pulses palpable, no extremity edema. Abdomen: Does not appear distended.  I placed a 20-gauge angiocatheter in the right cubital vein, further fluid management will be per Tandy GawJade Breeback, PA-C  Impression and Recommendations:   This case required medical decision making of moderate complexity.

## 2015-01-24 NOTE — Progress Notes (Signed)
Patient was no show for appointment

## 2015-01-26 ENCOUNTER — Telehealth: Payer: Self-pay

## 2015-01-26 NOTE — Telephone Encounter (Signed)
Patient called and states she needs another day off work because she is still feeling run down. I advised her to start a BRAT diet and keep well hydrated. Note faxed to [phone].

## 2015-01-27 NOTE — Telephone Encounter (Signed)
The diarrhea and vomiting has resolved.

## 2015-01-27 NOTE — Telephone Encounter (Signed)
Can you confirm diarrhea and vomiting has stopped?

## 2015-01-27 NOTE — Telephone Encounter (Signed)
Left message asking how patient is doing.

## 2015-01-30 ENCOUNTER — Encounter: Payer: Self-pay | Admitting: Emergency Medicine

## 2015-01-30 LAB — T4
FREE THYROXINE INDEX: 2.9
Free T4: 1.29
HIV: NEGATIVE
Hep B Surface Ab, Qual: NEGATIVE
RUBELLA ANTIBODY, SERUM, TITER: 6.3
T3 Uptake: 24
T4, Total: 12.2

## 2015-02-25 ENCOUNTER — Encounter: Payer: Self-pay | Admitting: Emergency Medicine

## 2015-02-25 ENCOUNTER — Emergency Department
Admission: EM | Admit: 2015-02-25 | Discharge: 2015-02-25 | Disposition: A | Discharge status: Home / Self Care | Payer: BLUE CROSS/BLUE SHIELD | Source: Home / Self Care | Attending: Family Medicine | Admitting: Family Medicine

## 2015-02-25 DIAGNOSIS — B9789 Other viral agents as the cause of diseases classified elsewhere: Principal | ICD-10-CM

## 2015-02-25 DIAGNOSIS — J069 Acute upper respiratory infection, unspecified: Secondary | ICD-10-CM | POA: Diagnosis not present

## 2015-02-25 LAB — POCT CBC W AUTO DIFF (K'VILLE URGENT CARE)

## 2015-02-25 NOTE — ED Notes (Signed)
Pt c/o head congestion, runny nose, nasal drainage, productive cough

## 2015-02-25 NOTE — Discharge Instructions (Signed)
Take plain guaifenesin (1200mg  extended release tabs such as Mucinex) twice daily, with plenty of water, for cough and congestion.  May add Pseudoephedrine (30mg , one or two every 4 to 6 hours) for sinus congestion.  Get adequate rest.    Also recommend using saline nasal spray several times daily and saline nasal irrigation (AYR is a common brand)  Try warm salt water gargles for sore throat.  Stop all antihistamines for now, and other non-prescription cough/cold preparations. May take Tylenol for headache, fever, etc. May take Delsym Cough Suppressant at bedtime for nighttime cough.     Follow-up with family doctor if not improving about one week

## 2015-02-25 NOTE — ED Provider Notes (Signed)
CSN: [id number]     Arrival date & time 02/25/15  1249 History   First MD Initiated Contact with Patient 02/25/15 1435     Chief Complaint  Patient presents with  . URI      HPI Comments: Patient complains of four day history of typical cold-like symptoms including "scratchy" throat, neck soreness, sinus congestion, headache, fatigue, and cough.  She is [redacted] weeks pregnant.  The history is provided by the patient.    History reviewed. No pertinent past medical history. Past Surgical History  Procedure Laterality Date  . Tonsilectomy/adenoidectomy with myringotomy     Family History  Problem Relation Age of Onset  . Alcohol abuse Father   . Hyperlipidemia     Social History  Substance Use Topics  . Smoking status: Never Smoker   . Smokeless tobacco: None  . Alcohol Use: Yes   OB History    Gravida Para Term Preterm AB TAB SAB Ectopic Multiple Living   1              Review of Systems ? sore throat + hoarse + cough + sneezing No pleuritic pain, but feels tight in anterior chest No wheezing + nasal congestion + post-nasal drainage No sinus pain/pressure No itchy/red eyes ? earache No hemoptysis No SOB No fever/chills No nausea No vomiting No abdominal pain No diarrhea No urinary symptoms No skin rash + fatigue No myalgias + headache Used OTC meds without relief  Allergies  Shellfish allergy; Hydrocodone-acetaminophen; and Latex  Home Medications   Prior to Admission medications   Medication Sig Start Date End Date Taking? Authorizing Provider  ALPRAZolam Prudy Feeler(XANAX) 0.25 MG tablet Take 0.25 mg by mouth as needed for anxiety.    Historical Provider, MD  benzonatate (TESSALON) 200 MG capsule Take 1 capsule (200 mg total) by mouth 2 (two) times daily as needed for cough. 11/17/14   Sean Hommel, DO  buPROPion (WELLBUTRIN XL) 150 MG 24 hr tablet Take 1 tablet (150 mg total) by mouth daily. 09/12/14   [name] L Breeback, PA-C  FLUoxetine (PROZAC) 10 MG capsule Take 1  capsule (10 mg total) by mouth 2 (two) times daily. 01/26/14   Jomarie LongsJade L Breeback, PA-C  levothyroxine (SYNTHROID, LEVOTHROID) 50 MCG tablet take 1 tablet by mouth once daily 01/13/15   Sunnie NielsenNatalie Alexander, DO  omega-3 acid ethyl esters (LOVAZA) 1 G capsule Take 2 capsules (2 g total) by mouth 2 (two) times daily. 12/05/14   Jomarie LongsJade L Breeback, PA-C  Prenatal Vit-Fe Fumarate-FA (PRENATAL MULTIVITAMIN) TABS tablet Take 1 tablet by mouth daily at 12 noon.    Historical Provider, MD  promethazine (PHENERGAN) 25 MG tablet Take 1 tablet (25 mg total) by mouth every 6 (six) hours as needed for nausea or vomiting. 01/24/15   [name] L Breeback, PA-C  vitamin B-12 (CYANOCOBALAMIN) 1000 MCG tablet Take 1,000 mcg by mouth daily.    Historical Provider, MD   Meds Ordered and Administered this Visit  Medications - No data to display  BP 119/79 mmHg  Pulse 109  Temp(Src) 97.5 F (36.4 C) (Oral)  Ht 5\' 4"  (1.626 m)  Wt 150 lb 8 oz (68.266 kg)  BMI 25.82 kg/m2  SpO2 100% No data found.   Physical Exam Nursing notes and Vital Signs reviewed. Appearance:  Patient appears stated age, and in no acute distress Eyes:  Pupils are equal, round, and reactive to light and accomodation.  Extraocular movement is intact.  Conjunctivae are not inflamed  Ears:  Canals  normal.  Tympanic membranes normal.  Nose:  Mildly congested turbinates.  No sinus tenderness.   Pharynx:  Normal Neck:  Supple.   Tender enlarged posterior nodes are palpated bilaterally  Lungs:  Clear to auscultation.  Breath sounds are equal.  Moving air well. Chest:  Distinct tenderness to palpation over the mid-sternum.  Heart:  Regular rate and rhythm without murmurs, rubs, or gallops. Rate 108 Abdomen:  Nontender without masses or hepatosplenomegaly.  Bowel sounds are present.  No CVA or flank tenderness.  Extremities:  No edema.  No calf tenderness Skin:  No rash present.   ED Course  Procedures  None    Labs Reviewed  POCT CBC W AUTO DIFF  ([facility]):  WBC 9.9; LY 12.3; MO 3.7; GR 84.0; Hgb 12.6; Platelets 327      MDM   1. Viral URI with cough    There is no evidence of bacterial infection today.  Treat symptomatically for now  Take plain guaifenesin (  extended release tabs such as Mucinex) twice daily, with plenty of water, for cough and congestion.  May add Pseudoephedrine ( , one or two every 4 to 6 hours) for sinus congestion.  Get adequate rest.    Also recommend using saline nasal spray several times daily and saline nasal irrigation (AYR is a common brand)  Try warm salt water gargles for sore throat.  Stop all antihistamines for now, and other non-prescription cough/cold preparations. May take Tylenol for headache, fever, etc. May take Delsym Cough Suppressant at bedtime for nighttime cough.     Follow-up with family doctor if not improving about one week    Lattie Haw, MD 03/03/15 [phone]

## 2015-03-06 ENCOUNTER — Encounter: Payer: Self-pay | Admitting: Physician Assistant

## 2015-03-06 ENCOUNTER — Ambulatory Visit (INDEPENDENT_AMBULATORY_CARE_PROVIDER_SITE_OTHER): Payer: BLUE CROSS/BLUE SHIELD | Admitting: Physician Assistant

## 2015-03-06 ENCOUNTER — Ambulatory Visit (INDEPENDENT_AMBULATORY_CARE_PROVIDER_SITE_OTHER): Payer: BLUE CROSS/BLUE SHIELD | Admitting: Sports Medicine

## 2015-03-06 VITALS — BP 116/49 | HR 92 | Temp 98.1°F | Ht 64.0 in | Wt 147.0 lb

## 2015-03-06 DIAGNOSIS — R42 Dizziness and giddiness: Secondary | ICD-10-CM

## 2015-03-06 DIAGNOSIS — E860 Dehydration: Secondary | ICD-10-CM | POA: Diagnosis not present

## 2015-03-06 DIAGNOSIS — I878 Other specified disorders of veins: Secondary | ICD-10-CM

## 2015-03-06 DIAGNOSIS — O219 Vomiting of pregnancy, unspecified: Secondary | ICD-10-CM

## 2015-03-06 MED ORDER — METOCLOPRAMIDE HCL 10 MG PO TABS: 10.0000 mg | ORAL_TABLET | Freq: Three times a day (TID) | ORAL | Status: DC | PRN

## 2015-03-06 NOTE — Progress Notes (Signed)
   Subjective:    Patient ID: Melissa Sharp, female    DOB: 12/29/1980, 34 y.o.   MRN: [id number]  HPI  Pt is a 34 yo female who is [redacted] weeks pregnant who presents to the clinic with nausea and vomiting for 4 days. She has vomited on average 2 times a day. Denies any diarrhea. She is not taking anything for nausea. She had some very mild bilateral lower abdominal cramping but has has resolved. No vaginal bleeding. No urinary symptoms. No fever, chills, body aches. She is getting over a upper respiratory viral infection. No sinus pressure, ear pain, ST. She has occasional dry cough. No SOB or wheezing. She does complain to be weak and dizzy.     Review of Systems See HPI.     Objective:   Physical Exam  Constitutional: She is oriented to person, place, and time. She appears well-developed and well-nourished.  HENT:  Head: Normocephalic and atraumatic.  Eyes: Conjunctivae are normal. Right eye exhibits no discharge. Left eye exhibits no discharge.  Neck: Normal range of motion. Neck supple.  Cardiovascular: Normal rate, regular rhythm and normal heart sounds.   Pulmonary/Chest: Effort normal and breath sounds normal. She has no wheezes.  Negative for CVA tenderness.   Abdominal: Soft. Bowel sounds are normal. She exhibits no distension and no mass. There is no tenderness. There is no rebound and no guarding.  Neurological: She is alert and oriented to person, place, and time.  Skin:  Flushed cheeks.   Psychiatric: She has a normal mood and affect. Her behavior is normal.          Assessment & Plan:  Nausea and vomiting in pregnancy/dehydration/dizziness- discussed with patient could be viral gastroenteritis again or this could be some pregnancy induced n/v. I am concerned she is dehydrated with dizziness and low bp.   Dr. Montez Moritahekkenkadam started IV fluids. 1L of NS IV fluids given in office today. Due to time we stopped it was already 5:15 when she left office.   Rechecked BP and  116/49. A little better. Pt reported to feel better.   reglan given for n/v to take every 6-8 hours as needed for n/v up to 3 times a day. Discussed home oral hydration and increase in salt for low BP. Advance diet as tolerated. No signs of bacterial infection today. If any new symptoms start please call office.   Wrote out of work for today and [employer] account executivetomorrow.

## 2015-03-06 NOTE — Progress Notes (Addendum)
  Subjective:    CC: Consultation for dehydration  HPI: This is a pleasant 34 year old female, she has been diagnosed with dehydration and I am consulted for assistance with venous access for hydration.  Past medical history, Surgical history, Family history not pertinant except as noted below, Social history, Allergies, and medications have been entered into the medical record, reviewed, and no changes needed.   Review of Systems: No fevers, chills, night sweats, weight loss, chest pain, or shortness of breath.   Objective:    General: Well Developed, well nourished, and in no acute distress.  Neuro: Alert and oriented x3, extra-ocular muscles intact, sensation grossly intact.  HEENT: Normocephalic, atraumatic, pupils equal round reactive to light, neck supple, no masses, no lymphadenopathy, thyroid nonpalpable.  Skin: Warm and dry, no rashes. Cardiac: Regular rate and rhythm, no murmurs rubs or gallops, no lower extremity edema.  Respiratory: Clear to auscultation bilaterally. Not using accessory muscles, speaking in full sentences.  Angiocatheter, 20-gauge placed in the right cubital vein, with a saline lock, and IV fluids started. 1 L of normal saline infused with a start time of 4:15 PM and an end time of 5:15 PM, fluid was infused at a rate of 1000 mL per hour.  Impression and Recommendations:    I spent 25 minutes with this patient, greater than 50% was face-to-face time counseling regarding the above diagnoses, this time was separate from the time spent placing the peripheral line.

## 2015-03-06 NOTE — Patient Instructions (Signed)

## 2015-03-06 NOTE — Assessment & Plan Note (Signed)
20-gauge angiocatheter in the right cubital vein, further fluid management per Tandy GawJade Breeback, PA-C.

## 2015-03-07 ENCOUNTER — Encounter: Payer: Self-pay | Admitting: Physician Assistant

## 2015-03-07 DIAGNOSIS — E860 Dehydration: Secondary | ICD-10-CM | POA: Insufficient documentation

## 2015-03-07 DIAGNOSIS — R42 Dizziness and giddiness: Secondary | ICD-10-CM | POA: Insufficient documentation

## 2015-03-07 DIAGNOSIS — O219 Vomiting of pregnancy, unspecified: Secondary | ICD-10-CM | POA: Insufficient documentation

## 2015-03-08 ENCOUNTER — Ambulatory Visit: Payer: Self-pay | Admitting: Physician Assistant

## 2015-05-03 ENCOUNTER — Ambulatory Visit (INDEPENDENT_AMBULATORY_CARE_PROVIDER_SITE_OTHER): Payer: BLUE CROSS/BLUE SHIELD | Admitting: Physician Assistant

## 2015-05-03 ENCOUNTER — Encounter: Payer: Self-pay | Admitting: Physician Assistant

## 2015-05-03 VITALS — BP 109/63 | HR 107 | Ht 64.0 in | Wt 158.0 lb

## 2015-05-03 DIAGNOSIS — S161XXD Strain of muscle, fascia and tendon at neck level, subsequent encounter: Secondary | ICD-10-CM

## 2015-05-03 DIAGNOSIS — M62830 Muscle spasm of back: Secondary | ICD-10-CM | POA: Diagnosis not present

## 2015-05-03 DIAGNOSIS — F0781 Postconcussional syndrome: Secondary | ICD-10-CM | POA: Diagnosis not present

## 2015-05-03 NOTE — Patient Instructions (Signed)
Post-Concussion Syndrome Post-concussion syndrome is the symptoms that can occur after a head injury. These symptoms can last from weeks to months. HOME CARE   Take medicines only as told by your doctor.  Do not take aspirin.  Sleep with your head raised to help with headaches.  Avoid activities that can cause another head injury.  Do not play contact sports like football, hockey, soccer, or basketball.  Do not do other risky activities like downhill skiing, martial arts, or horseback riding until your doctor says it is okay.  Keep all follow-up visits as told by your doctor. This is important. GET HELP IF:   You have a harder time:  Paying attention.  Focusing.  Remembering.  Learning new information.  Dealing with stress.  You need more time to complete tasks.  You are easily bothered (irritable).  You have more symptoms. Get help if you have any of these symptoms for more than two weeks after your injury:   Long-lasting (chronic) headaches.  Dizziness.  Trouble balancing.  Feeling sick to your stomach (nauseous).  Trouble with your vision.  Noise or light bothers you more.  Depression.  Mood swings.  Feeling worried (anxious).  Easily bothered.  Memory problems.  Trouble concentrating or paying attention.  Sleep problems.  Feeling tired all of the time. GET HELP RIGHT AWAY IF:  You feel confused.  You feel very sleepy.  You are hard to wake up.  You feel sick to your stomach.  You keep throwing up (vomiting).  You feel like you are moving when you are not (vertigo).  Your eyes move back and forth very quickly.  You start shaking (convulsing) or pass out (faint).  You have very bad headaches that do not get better with medicine.  You cannot use your arms or legs like normal.  One of the black centers of your eyes (pupils) is bigger than the other.  You have clear or bloody fluid coming from your nose or ears.  Your problems  get worse, not better. MAKE SURE YOU:  Understand these instructions.  Will watch your condition.  Will get help right away if you are not doing well or get worse.   This information is not intended to replace advice given to you by your health care provider. Make sure you discuss any questions you have with your health care provider.   Document Released: 04/11/2004 Document Revised: 03/25/2014 Document Reviewed: 06/09/2013 Elsevier Interactive Patient Education 2016 [employer] Concussion, Adult A concussion, or closed-head injury, is a brain injury caused by a direct blow to the head or by a quick and sudden movement (jolt) of the head or neck. Concussions are usually not life-threatening. Even so, the effects of a concussion can be serious. If you have had a concussion before, you are more likely to experience concussion-like symptoms after a direct blow to the head.  CAUSES  Direct blow to the head, such as from running into another player during a soccer game, being hit in a fight, or hitting your head on a hard surface.  A jolt of the head or neck that causes the brain to move back and forth inside the skull, such as in a car crash. SIGNS AND SYMPTOMS The signs of a concussion can be hard to notice. Early on, they may be missed by you, family members, and health care providers. You may look fine but act or feel differently. Symptoms are usually temporary, but they may last for days, weeks, or   even longer. Some symptoms may appear right away while others may not show up for hours or days. Every head injury is different. Symptoms include:  Mild to moderate headaches that will not go away.  A feeling of pressure inside your head.  Having more trouble than usual:  Learning or remembering things you have heard.  Answering questions.  Paying attention or concentrating.  Organizing daily tasks.  Making decisions and solving problems.  Slowness in thinking, acting or  reacting, speaking, or reading.  Getting lost or being easily confused.  Feeling tired all the time or lacking energy (fatigued).  Feeling drowsy.  Sleep disturbances.  Sleeping more than usual.  Sleeping less than usual.  Trouble falling asleep.  Trouble sleeping (insomnia).  Loss of balance or feeling lightheaded or dizzy.  Nausea or vomiting.  Numbness or tingling.  Increased sensitivity to:  Sounds.  Lights.  Distractions.  Vision problems or eyes that tire easily.  Diminished sense of taste or smell.  Ringing in the ears.  Mood changes such as feeling sad or anxious.  Becoming easily irritated or angry for little or no reason.  Lack of motivation.  Seeing or hearing things other people do not see or hear (hallucinations). DIAGNOSIS Your health care provider can usually diagnose a concussion based on a description of your injury and symptoms. He or she will ask whether you passed out (lost consciousness) and whether you are having trouble remembering events that happened right before and during your injury. Your evaluation might include:  A brain scan to look for signs of injury to the brain. Even if the test shows no injury, you may still have a concussion.  Blood tests to be sure other problems are not present. TREATMENT  Concussions are usually treated in an emergency department, in urgent care, or at a clinic. You may need to stay in the hospital overnight for further treatment.  Tell your health care provider if you are taking any medicines, including prescription medicines, over-the-counter medicines, and natural remedies. Some medicines, such as blood thinners (anticoagulants) and aspirin, may increase the chance of complications. Also tell your health care provider whether you have had alcohol or are taking illegal drugs. This information may affect treatment.  Your health care provider will send you home with important instructions to  follow.  How fast you will recover from a concussion depends on many factors. These factors include how severe your concussion is, what part of your brain was injured, your age, and how healthy you were before the concussion.  Most people with mild injuries recover fully. Recovery can take time. In general, recovery is slower in older persons. Also, persons who have had a concussion in the past or have other medical problems may find that it takes longer to recover from their current injury. HOME CARE INSTRUCTIONS General Instructions  Carefully follow the directions your health care provider gave you.  Only take over-the-counter or prescription medicines for pain, discomfort, or fever as directed by your health care provider.  Take only those medicines that your health care provider has approved.  Do not drink alcohol until your health care provider says you are well enough to do so. Alcohol and certain other drugs may slow your recovery and can put you at risk of further injury.  If it is harder than usual to remember things, write them down.  If you are easily distracted, try to do one thing at a time. For example, do not try to watch   TV while fixing dinner.  Talk with family members or close friends when making important decisions.  Keep all follow-up appointments. Repeated evaluation of your symptoms is recommended for your recovery.  Watch your symptoms and tell others to do the same. Complications sometimes occur after a concussion. Older adults with a brain injury may have a higher risk of serious complications, such as a blood clot on the brain.  Tell your teachers, school nurse, school counselor, coach, athletic trainer, or work manager about your injury, symptoms, and restrictions. Tell them about what you can or cannot do. They should watch for:  Increased problems with attention or concentration.  Increased difficulty remembering or learning new information.  Increased  time needed to complete tasks or assignments.  Increased irritability or decreased ability to cope with stress.  Increased symptoms.  Rest. Rest helps the brain to heal. Make sure you:  Get plenty of sleep at night. Avoid staying up late at night.  Keep the same bedtime hours on weekends and weekdays.  Rest during the day. Take daytime naps or rest breaks when you feel tired.  Limit activities that require a lot of thought or concentration. These include:  Doing homework or job-related work.  Watching TV.  Working on the computer.  Avoid any situation where there is potential for another head injury (football, hockey, soccer, basketball, martial arts, downhill snow sports and horseback riding). Your condition will get worse every time you experience a concussion. You should avoid these activities until you are evaluated by the appropriate follow-up health care providers. Returning To Your Regular Activities You will need to return to your normal activities slowly, not all at once. You must give your body and brain enough time for recovery.  Do not return to sports or other athletic activities until your health care provider tells you it is safe to do so.  Ask your health care provider when you can drive, ride a bicycle, or operate heavy machinery. Your ability to react may be slower after a brain injury. Never do these activities if you are dizzy.  Ask your health care provider about when you can return to work or school. Preventing Another Concussion It is very important to avoid another brain injury, especially before you have recovered. In rare cases, another injury can lead to permanent brain damage, brain swelling, or death. The risk of this is greatest during the first 7-10 days after a head injury. Avoid injuries by:  Wearing a seat belt when riding in a car.  Drinking alcohol only in moderation.  Wearing a helmet when biking, skiing, skateboarding, skating, or doing  similar activities.  Avoiding activities that could lead to a second concussion, such as contact or recreational sports, until your health care provider says it is okay.  Taking safety measures in your home.  Remove clutter and tripping hazards from floors and stairways.  Use grab bars in bathrooms and handrails by stairs.  Place non-slip mats on floors and in bathtubs.  Improve lighting in dim areas. SEEK MEDICAL CARE IF:  You have increased problems paying attention or concentrating.  You have increased difficulty remembering or learning new information.  You need more time to complete tasks or assignments than before.  You have increased irritability or decreased ability to cope with stress.  You have more symptoms than before. Seek medical care if you have any of the following symptoms for more than 2 weeks after your injury:  Lasting (chronic) headaches.  Dizziness or balance   problems.  Nausea.  Vision problems.  Increased sensitivity to noise or light.  Depression or mood swings.  Anxiety or irritability.  Memory problems.  Difficulty concentrating or paying attention.  Sleep problems.  Feeling tired all the time. SEEK IMMEDIATE MEDICAL CARE IF:  You have severe or worsening headaches. These may be a sign of a blood clot in the brain.  You have weakness (even if only in one hand, leg, or part of the face).  You have numbness.  You have decreased coordination.  You vomit repeatedly.  You have increased sleepiness.  One pupil is larger than the other.  You have convulsions.  You have slurred speech.  You have increased confusion. This may be a sign of a blood clot in the brain.  You have increased restlessness, agitation, or irritability.  You are unable to recognize people or places.  You have neck pain.  It is difficult to wake you up.  You have unusual behavior changes.  You lose consciousness. MAKE SURE YOU:  Understand these  instructions.  Will watch your condition.  Will get help right away if you are not doing well or get worse.   This information is not intended to replace advice given to you by your health care provider. Make sure you discuss any questions you have with your health care provider.   Document Released: 05/25/2003 Document Revised: 03/25/2014 Document Reviewed: 09/24/2012 Elsevier Interactive Patient Education 2016 [employer] Inc[employer]  
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
## 2015-05-05 ENCOUNTER — Other Ambulatory Visit: Payer: Self-pay | Admitting: *Deleted

## 2015-05-05 ENCOUNTER — Telehealth: Payer: Self-pay | Admitting: Physician Assistant

## 2015-05-05 MED ORDER — AZITHROMYCIN 250 MG PO TABS: ORAL_TABLET | ORAL | Status: DC

## 2015-05-05 NOTE — Telephone Encounter (Signed)
Spoke with pt and gave her all the recommendations.

## 2015-05-05 NOTE — Telephone Encounter (Signed)
For cold:  mucinex and delsym ok.  Steam inhalation is a wonderful way to remedy respiratory problems such as chest congestion, sinusitis, bronchitis and bronchial cough. It is easy to do and very cost-effective: simply bring a pot of water to boil on your stove, then stand over it and drape a towel over your shoulders. Be careful not to get too close to the steam, and that the towel does not touch the flame or burner on your stove. Try to breathe through your nose if you have nasal and sinus problems; if you are too stuffed up, breathe through your mouth with your lips pursed. The steam will help keep nasal passages moistened and relieve some of the aches and pains associated with respiratory problems.   As far as headache are you taking tylenol  up to three times a day.

## 2015-05-08 ENCOUNTER — Telehealth: Payer: Self-pay | Admitting: *Deleted

## 2015-05-08 NOTE — Telephone Encounter (Signed)
Pt left vm stating that she is still having pretty severe neck pain & migraines.  She said that she tried to go out & drive this weekend but got an instant migraine with the sunlight.  She is still taking the muscle relaxer as well.

## 2015-05-09 NOTE — Telephone Encounter (Signed)
Spoke with pt today & she is still having the bad headaches so OB is referring her to neurology.

## 2015-05-09 NOTE — Telephone Encounter (Signed)
How is she doing today? Have you recently followed up with OB they may be able to discuss more options for migraine relief that would be appropriate for someone pregnant.

## 2015-05-18 ENCOUNTER — Ambulatory Visit (INDEPENDENT_AMBULATORY_CARE_PROVIDER_SITE_OTHER): Payer: BLUE CROSS/BLUE SHIELD | Admitting: Neurology

## 2015-05-18 ENCOUNTER — Encounter: Payer: Self-pay | Admitting: Neurology

## 2015-05-18 VITALS — BP 125/80 | HR 99 | Ht 63.0 in | Wt 158.6 lb

## 2015-05-18 DIAGNOSIS — F0781 Postconcussional syndrome: Secondary | ICD-10-CM

## 2015-05-18 MED ORDER — METOCLOPRAMIDE HCL 10 MG PO TABS: 10.0000 mg | ORAL_TABLET | Freq: Three times a day (TID) | ORAL | Status: DC | PRN

## 2015-05-18 NOTE — Patient Instructions (Signed)
Remember to drink plenty of fluid, eat healthy meals and do not skip any meals. Try to eat protein with a every meal and eat a healthy snack such as fruit or nuts in between meals. Try to keep a regular sleep-wake schedule and try to exercise daily, particularly in the form of walking, 20-30 minutes a day, if you can.   As far as your medications are concerned, I would like to suggest: Reglan as needed.  I would like to see you back as needed, sooner if we need to. Please call us with any interim questions, concerns, problems, updates or refill requests.   Our phone number is [phone]. We also have an after hours call service for urgent matters and there is a physician on-call for urgent questions. For any emergencies you know to call 911 or go to the nearest emergency room
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
## 2015-05-21 ENCOUNTER — Encounter: Payer: Self-pay | Admitting: Neurology

## 2015-05-21 DIAGNOSIS — F0781 Postconcussional syndrome: Secondary | ICD-10-CM | POA: Insufficient documentation

## 2015-05-24 ENCOUNTER — Ambulatory Visit: Payer: BLUE CROSS/BLUE SHIELD | Admitting: Neurology

## 2015-05-27 ENCOUNTER — Encounter: Payer: Self-pay | Admitting: Emergency Medicine

## 2015-05-27 ENCOUNTER — Emergency Department
Admission: EM | Admit: 2015-05-27 | Discharge: 2015-05-27 | Disposition: A | Discharge status: Home / Self Care | Payer: BLUE CROSS/BLUE SHIELD | Source: Home / Self Care | Attending: Family Medicine | Admitting: Family Medicine

## 2015-05-27 DIAGNOSIS — J019 Acute sinusitis, unspecified: Secondary | ICD-10-CM

## 2015-05-27 DIAGNOSIS — B9689 Other specified bacterial agents as the cause of diseases classified elsewhere: Secondary | ICD-10-CM

## 2015-05-27 MED ORDER — AMOXICILLIN-POT CLAVULANATE 875-125 MG PO TABS: 1.0000 | ORAL_TABLET | Freq: Two times a day (BID) | ORAL | Status: DC

## 2015-05-27 NOTE — ED Notes (Signed)
Pt c/o HA, facial pain and fatigue x1 week. She is 29 weeks OB.

## 2015-05-27 NOTE — Discharge Instructions (Signed)
You may take  Tylenol every 4-6 hours as needed for fever and pain. You may also continue to use the [facility] and over the counter nasal saline.  Follow-up with your primary care provider next week for recheck of symptoms if not improving.  Be sure to drink plenty of fluids and rest, at least 8hrs of sleep a night, preferably more while you are sick. Return urgent care or go to closest ER if you cannot keep down fluids/signs of dehydration, fever not reducing with Tylenol, difficulty breathing/wheezing, stiff neck, worsening condition, or other concerns (see below)  Please take antibiotics as prescribed and be sure to complete entire course even if you start to feel better to ensure infection does not come back.   Sinusitis, Adult Sinusitis is redness, soreness, and inflammation of the paranasal sinuses. Paranasal sinuses are air pockets within the bones of your face. They are located beneath your eyes, in the middle of your forehead, and above your eyes. In healthy paranasal sinuses, mucus is able to drain out, and air is able to circulate through them by way of your nose. However, when your paranasal sinuses are inflamed, mucus and air can become trapped. This can allow bacteria and other germs to grow and cause infection. Sinusitis can develop quickly and last only a short time (acute) or continue over a long period (chronic). Sinusitis that lasts for more than 12 weeks is considered chronic. CAUSES Causes of sinusitis include:  Allergies.  Structural abnormalities, such as displacement of the cartilage that separates your nostrils (deviated septum), which can decrease the air flow through your nose and sinuses and affect sinus drainage.  Functional abnormalities, such as when the small hairs (cilia) that line your sinuses and help remove mucus do not work properly or are not present. SIGNS AND SYMPTOMS Symptoms of acute and chronic sinusitis are the same. The primary symptoms are pain  and pressure around the affected sinuses. Other symptoms include:  Upper toothache.  Earache.  Headache.  Bad breath.  Decreased sense of smell and taste.  A cough, which worsens when you are lying flat.  Fatigue.  Fever.  Thick drainage from your nose, which often is green and may contain pus (purulent).  Swelling and warmth over the affected sinuses. DIAGNOSIS Your health care provider will perform a physical exam. During your exam, your health care provider may perform any of the following to help determine if you have acute sinusitis or chronic sinusitis:  Look in your nose for signs of abnormal growths in your nostrils (nasal polyps).  Tap over the affected sinus to check for signs of infection.  View the inside of your sinuses using an imaging device that has a light attached (endoscope). If your health care provider suspects that you have chronic sinusitis, one or more of the following tests may be recommended:  Allergy tests.  Nasal culture. A sample of mucus is taken from your nose, sent to a lab, and screened for bacteria.  Nasal cytology. A sample of mucus is taken from your nose and examined by your health care provider to determine if your sinusitis is related to an allergy. TREATMENT Most cases of acute sinusitis are related to a viral infection and will resolve on their own within 10 days. Sometimes, medicines are prescribed to help relieve symptoms of both acute and chronic sinusitis. These may include pain medicines, decongestants, nasal steroid sprays, or saline sprays. However, for sinusitis related to a bacterial infection, your health care provider will  prescribe antibiotic medicines. These are medicines that will help kill the bacteria causing the infection. Rarely, sinusitis is caused by a fungal infection. In these cases, your health care provider will prescribe antifungal medicine. For some cases of chronic sinusitis, surgery is needed. Generally,  these are cases in which sinusitis recurs more than 3 times per year, despite other treatments. HOME CARE INSTRUCTIONS  Drink plenty of water. Water helps thin the mucus so your sinuses can drain more easily.  Use a humidifier.  Inhale steam 3-4 times a day (for example, sit in the bathroom with the shower running).  Apply a warm, moist washcloth to your face 3-4 times a day, or as directed by your health care provider.  Use saline nasal sprays to help moisten and clean your sinuses.  Take medicines only as directed by your health care provider.  If you were prescribed either an antibiotic or antifungal medicine, finish it all even if you start to feel better. SEEK IMMEDIATE MEDICAL CARE IF:  You have increasing pain or severe headaches.  You have nausea, vomiting, or drowsiness.  You have swelling around your face.  You have vision problems.  You have a stiff neck.  You have difficulty breathing.   This information is not intended to replace advice given to you by your health care provider. Make sure you discuss any questions you have with your health care provider.   Document Released: 03/04/2005 Document Revised: 03/25/2014 Document Reviewed: 03/19/2011 Elsevier Interactive Patient Education 2016 [employer]  Sinus Rinse WHAT IS A SINUS RINSE? A sinus rinse is a home treatment. It rinses your sinuses with a mixture of salt and water (saline solution). Sinuses are air-filled spaces in your skull behind the bones of your face and forehead. They open into your nasal cavity. To do a sinus rinse, you will need:  Saline solution.  Neti pot or spray bottle. This releases the saline solution into your nose and through your sinuses. You can buy neti pots and spray bottles at:  Your local pharmacy.  A health food store.  Online. WHEN WOULD I DO A SINUS RINSE?  A sinus rinse can help to clear your nasal cavity. It can clear:   Mucus.  Dirt.  Dust.  Pollen. You  may do a sinus rinse when you have:  A cold.  A virus.  Allergies.  A sinus infection.  A stuffy nose. If you are considering a sinus rinse:  Ask your child's doctor before doing a sinus rinse on your child.  Do not do a sinus rinse if you have had:  Ear or nasal surgery.  An ear infection.  Blocked ears. HOW DO I DO A SINUS RINSE?   Wash your hands.  Disinfect your device using the directions that came with the device.  Dry your device.  Use the solution that comes with your device or one that is sold separately in stores. Follow the mixing directions on the package.  Fill your device with the amount of saline solution as stated in the device instructions.  Stand over a sink and tilt your head sideways over the sink.  Place the spout of the device in your upper nostril (the one closer to the ceiling).  Gently pour or squeeze the saline solution into the nasal cavity. The liquid should drain to the lower nostril if you are not too congested.  Gently blow your nose. Blowing too hard may cause ear pain.  Repeat in the other nostril.  Clean  and rinse your device with clean water.  Air-dry your device. ARE THERE RISKS OF A SINUS RINSE?  Sinus rinse is normally very safe and helpful. However, there are a few risks, which include:   A burning feeling in the sinuses. This may happen if you do not make the saline solution as instructed. Make sure to follow all directions when making the saline solution.  Infection from unclean water. This is rare, but possible.  Nasal irritation.   This information is not intended to replace advice given to you by your health care provider. Make sure you discuss any questions you have with your health care provider.   Document Released: 09/29/2013 Document Reviewed: 09/29/2013 Elsevier Interactive Patient Education [employer]2016 [employer].

## 2015-05-27 NOTE — ED Provider Notes (Signed)
CSN: [id number]     Arrival date & time 05/27/15  1124 History   First MD Initiated Contact with Patient 05/27/15 1217     Chief Complaint  Patient presents with  . Facial Pain   (Consider location/radiation/quality/duration/timing/severity/associated sxs/prior Treatment) HPI The pt is a 35yo female, [redacted]wks pregnant, presenting to [facility] with c/o 1 week of gradually worsening sinus pain and pressure.  She has been using a neti pot but states pain and pressure have only worsened and she is unsure of what medications she can take while pregnant.  She has had subjective fever, fatigue, intermittent dizziness with generalized headaches and facial pressure, and bilateral ear pain.  Denies n/vd/.   Past Medical History  Diagnosis Date  . Migraine   . Depression   . Anxiety    Past Surgical History  Procedure Laterality Date  . Tonsilectomy/adenoidectomy with myringotomy     Family History  Problem Relation Age of Onset  . Alcohol abuse Father   . Hyperlipidemia    . Migraines Neg Hx    Social History  Substance Use Topics  . Smoking status: Never Smoker   . Smokeless tobacco: None  . Alcohol Use: Yes   OB History    Gravida Para Term Preterm AB TAB SAB Ectopic Multiple Living   1              Review of Systems  Constitutional: Positive for fever and chills.  HENT: Positive for congestion, ear pain ( bilateral), postnasal drip, rhinorrhea, sinus pressure, sneezing and sore throat. Negative for trouble swallowing and voice change.   Respiratory: Positive for cough, shortness of breath and wheezing.   Cardiovascular: Negative for chest pain and palpitations.  Gastrointestinal: Negative for nausea, vomiting, abdominal pain and diarrhea.  Musculoskeletal: Negative for myalgias, back pain and arthralgias.  Skin: Negative for rash.  Neurological: Positive for dizziness and headaches. Negative for syncope and light-headedness.    Allergies  Shellfish allergy;  Hydrocodone-acetaminophen; and Latex  Home Medications   Prior to Admission medications   Medication Sig Start Date End Date Taking? Authorizing Provider  amoxicillin-clavulanate (AUGMENTIN) 875-125 MG tablet Take 1 tablet by mouth 2 (two) times daily. One po bid x 7 days 05/27/15   Junius Finner, PA-C  buPROPion (WELLBUTRIN XL) 150 MG 24 hr tablet Take 1 tablet (150 mg total) by mouth daily. 09/12/14   [name] L Breeback, PA-C  FLUoxetine (PROZAC) 10 MG capsule Take 1 capsule (10 mg total) by mouth 2 (two) times daily. 01/26/14   Jomarie Longs, PA-C  levothyroxine (SYNTHROID, LEVOTHROID) 50 MCG tablet take 1 tablet by mouth once daily 01/13/15   Sunnie Nielsen, DO  metoCLOPramide (REGLAN) 10 MG tablet Take 1 tablet (10 mg total) by mouth every 8 (eight) hours as needed for nausea. 05/18/15   Anson Fret, MD  Prenatal Vit-Fe Fumarate-FA (PRENATAL MULTIVITAMIN) TABS tablet Take 1 tablet by mouth daily at 12 noon.    Historical Provider, MD  vitamin B-12 (CYANOCOBALAMIN) 1000 MCG tablet Take 1,000 mcg by mouth daily.    Historical Provider, MD   Meds Ordered and Administered this Visit  Medications - No data to display  BP 106/69 mmHg  Pulse 116  Temp(Src) 98.1 F (36.7 C) (Oral)  Wt 162 lb (73.483 kg)  SpO2 98% No data found.   Physical Exam  Constitutional: She appears well-developed and well-nourished. No distress.  HENT:  Head: Normocephalic and atraumatic.  Right Ear: Tympanic membrane normal.  Left Ear: Tympanic membrane  normal.  Nose: Mucosal edema and rhinorrhea present. Right sinus exhibits maxillary sinus tenderness and frontal sinus tenderness. Left sinus exhibits maxillary sinus tenderness and frontal sinus tenderness.  Mouth/Throat: Uvula is midline and mucous membranes are normal. Posterior oropharyngeal erythema present. No oropharyngeal exudate, posterior oropharyngeal edema or tonsillar abscesses.  Eyes: Conjunctivae are normal. No scleral icterus.  Neck: Normal  range of motion. Neck supple.  Cardiovascular: Regular rhythm and normal heart sounds.  Tachycardia present.   Tachycardia with regular rate and rhythm   Pulmonary/Chest: Effort normal and breath sounds normal. No respiratory distress. She has no wheezes. She has no rales. She exhibits no tenderness.  Abdominal: Soft. She exhibits no distension. There is no tenderness.  Musculoskeletal: Normal range of motion.  Neurological: She is alert.  Skin: Skin is warm and dry. She is not diaphoretic.  Nursing note and vitals reviewed.   ED Course  Procedures (including critical care time)  Labs Review Labs Reviewed - No data to display  Imaging Review No results found.    MDM   1. Acute bacterial rhinosinusitis    Pt c/o worsening sinus congestion pain and pressure.   [redacted] weeks pregnant.    Rx: augmentin  Encouraged to keep using Neti pot and nasal saline. May take tylenol for pain. Pt has previously scheduled appointment with OB/GYN early next week. Encouraged to f/u as scheduled and to let them know about starting antibiotics.  Patient verbalized understanding and agreement with treatment plan.    Junius Finnerrin O'Malley, PA-C 05/27/15 1257

## 2015-05-29 ENCOUNTER — Telehealth: Payer: Self-pay | Admitting: Physician Assistant

## 2015-05-29 NOTE — Telephone Encounter (Signed)
Can we call and check up on patient? She saw neurologist regarding headaches after concussion. I saw note. Did she start reglan? Is it helping with any headaches?

## 2015-06-01 NOTE — Telephone Encounter (Signed)
LMOM for pt to return call. 

## 2015-06-02 ENCOUNTER — Other Ambulatory Visit: Payer: Self-pay | Admitting: *Deleted

## 2015-06-02 MED ORDER — AZITHROMYCIN 250 MG PO TABS: ORAL_TABLET | ORAL | Status: DC

## 2015-06-05 ENCOUNTER — Ambulatory Visit: Payer: Self-pay | Admitting: Physician Assistant

## 2015-06-05 NOTE — Telephone Encounter (Signed)
Pt called back Friday afternoon with update.

## 2015-06-10 ENCOUNTER — Inpatient Hospital Stay (HOSPITAL_COMMUNITY)
Admission: AD | Admit: 2015-06-10 | Discharge: 2015-06-10 | Disposition: A | Discharge status: Home / Self Care | Payer: BLUE CROSS/BLUE SHIELD | Source: Ambulatory Visit | Attending: Obstetrics and Gynecology | Admitting: Obstetrics and Gynecology

## 2015-06-10 ENCOUNTER — Encounter (HOSPITAL_COMMUNITY): Payer: Self-pay | Admitting: *Deleted

## 2015-06-10 DIAGNOSIS — R42 Dizziness and giddiness: Secondary | ICD-10-CM | POA: Insufficient documentation

## 2015-06-10 DIAGNOSIS — Z3A31 31 weeks gestation of pregnancy: Secondary | ICD-10-CM | POA: Diagnosis not present

## 2015-06-10 DIAGNOSIS — O99343 Other mental disorders complicating pregnancy, third trimester: Secondary | ICD-10-CM | POA: Diagnosis not present

## 2015-06-10 DIAGNOSIS — R0600 Dyspnea, unspecified: Secondary | ICD-10-CM | POA: Insufficient documentation

## 2015-06-10 DIAGNOSIS — R000 Tachycardia, unspecified: Secondary | ICD-10-CM | POA: Diagnosis not present

## 2015-06-10 DIAGNOSIS — F418 Other specified anxiety disorders: Secondary | ICD-10-CM | POA: Insufficient documentation

## 2015-06-10 DIAGNOSIS — O9989 Other specified diseases and conditions complicating pregnancy, childbirth and the puerperium: Secondary | ICD-10-CM | POA: Diagnosis not present

## 2015-06-10 DIAGNOSIS — O26893 Other specified pregnancy related conditions, third trimester: Secondary | ICD-10-CM | POA: Insufficient documentation

## 2015-06-10 LAB — COMPREHENSIVE METABOLIC PANEL
ALT: 15 U/L (ref 14–54)
AST: 18 U/L (ref 15–41)
Albumin: 2.5 g/dL — ABNORMAL LOW (ref 3.5–5.0)
Alkaline Phosphatase: 92 U/L (ref 38–126)
Anion gap: 8 (ref 5–15)
BILIRUBIN TOTAL: 0.3 mg/dL (ref 0.3–1.2)
BUN: 6 mg/dL (ref 6–20)
CO2: 23 mmol/L (ref 22–32)
CREATININE: 0.57 mg/dL (ref 0.44–1.00)
Calcium: 9.2 mg/dL (ref 8.9–10.3)
Chloride: 108 mmol/L (ref 101–111)
GFR calc Af Amer: >60 mL/min (ref >60)
Glucose, Bld: 62 mg/dL — ABNORMAL LOW (ref 65–99)
POTASSIUM: 3.7 mmol/L (ref 3.5–5.1)
Sodium: 139 mmol/L (ref 135–145)
TOTAL PROTEIN: 6.1 g/dL — AB (ref 6.5–8.1)

## 2015-06-10 NOTE — MAU Provider Note (Signed)
History     CSN: [id number]  Arrival date and time: 06/10/15 1525   First Provider Initiated Contact with Patient 06/10/15 1637      Chief Complaint  Patient presents with  . Dizziness  . Shortness of Breath  . Tachycardia   HPI Melissa Sharp 35 y.o. [id number] Comes to MAU today after an episode at home of feeling dizzy and having difficulty breathing.  Her parents were there and checked her blood pressure and pulse - BP 97/42 and 103/68 - heart rate was 135.  Client was at urgent care recently with an illness and heart rate was 116 then.  Currently in MAU today heart rate is 99.  She reports having several episodes in this pregnancy where she is having difficulty breathing while at rest and feels like her heart is racing.  Additionally several times she has awakened at night wet with sweat - hair is wet and she has to change her clothing.  Currently now client is in no distress, no difficulty breathing and heart rate is 99.  OB History    Gravida Para Term Preterm AB TAB SAB Ectopic Multiple Living   1               Past Medical History  Diagnosis Date  . Migraine   . Depression   . Anxiety     Past Surgical History  Procedure Laterality Date  . Tonsilectomy/adenoidectomy with myringotomy      Family History  Problem Relation Age of Onset  . Alcohol abuse Father   . Hyperlipidemia    . Migraines Neg Hx     Social History  Substance Use Topics  . Smoking status: Never Smoker   . Smokeless tobacco: None  . Alcohol Use: Yes    Allergies:  Allergies  Allergen Reactions  . Hydrocodone-Acetaminophen Hives and Shortness Of Breath  . Shellfish Allergy Anaphylaxis  . Latex Hives    Prescriptions prior to admission  Medication Sig Dispense Refill Last Dose  . buPROPion (WELLBUTRIN XL) 150 MG 24 hr tablet Take 1 tablet (150 mg total) by mouth daily. 30 tablet 0 06/10/2015 at Unknown time  . cyclobenzaprine (FLEXERIL) 10 MG tablet Take 5-10 mg by mouth 3 (three) times  daily as needed for muscle spasms.   06/09/2015 at Unknown time  . FLUoxetine (PROZAC) 40 MG capsule Take 40 mg by mouth every evening.   06/09/2015 at Unknown time  . levothyroxine (SYNTHROID, LEVOTHROID) 50 MCG tablet take 1 tablet by mouth once daily (Patient taking differently: Take 50 mcg by mouth daily before breakfast. ) 30 tablet 1 06/10/2015 at Unknown time  . metoCLOPramide (REGLAN) 10 MG tablet Take 1 tablet (10 mg total) by mouth every 8 (eight) hours as needed for nausea. 30 tablet 6 Past Month at Unknown time  . Prenatal Vit-Fe Fumarate-FA (PRENATAL MULTIVITAMIN) TABS tablet Take 1 tablet by mouth every evening.    06/09/2015 at Unknown time  . vitamin B-12 (CYANOCOBALAMIN) 1000 MCG tablet Take 1,000 mcg by mouth daily.   06/09/2015 at Unknown time  . amoxicillin-clavulanate (AUGMENTIN) 875-125 MG tablet Take 1 tablet by mouth 2 (two) times daily. One po bid x 7 days (Patient not taking: Reported on 06/10/2015) 14 tablet 0   . azithromycin (ZITHROMAX) 250 MG tablet Take 2 tabs today, then 1 tab daily (Patient not taking: Reported on 06/10/2015) 6 tablet 0     Review of Systems  Constitutional: Negative for fever.       Night  sweats  Respiratory: Positive for shortness of breath. Negative for wheezing.   Cardiovascular: Negative for chest pain.       Heart feels like it is racing  Gastrointestinal: Negative for nausea, vomiting and abdominal pain.   Physical Exam   Blood pressure 135/78, pulse 99, temperature 97.9 F (36.6 C), temperature source Oral, resp. rate 20.  Physical Exam  Nursing note and vitals reviewed. Constitutional: She is oriented to person, place, and time. She appears well-developed and well-nourished.  Comfortable and in no distress.  HENT:  Head: Normocephalic.  Eyes: EOM are normal.  Neck: Neck supple.  Respiratory: Effort normal.  GI:  On fetal monitor - no contractions. FHT reactive with 15x15 accels noted  Musculoskeletal: Normal range of motion.   Neurological: She is alert and oriented to person, place, and time.  Skin: Skin is warm and dry.  Psychiatric: She has a normal mood and affect.    MAU Course  Procedures Results for orders placed or performed during the hospital encounter of 06/10/15 (from the past 24 hour(s))  Comprehensive metabolic panel     Status: Abnormal   Collection Time: 06/10/15  4:59 PM  Result Value Ref Range   Sodium 139 135 - 145 mmol/L   Potassium 3.7 3.5 - 5.1 mmol/L   Chloride 108 101 - 111 mmol/L   CO2 23 22 - 32 mmol/L   Glucose, Bld 62 (L) 65 - 99 mg/dL   BUN 6 6 - 20 mg/dL   Creatinine, Ser 3.53 0.44 - 1.00 mg/dL   Calcium 9.2 8.9 - 29.9 mg/dL   Total Protein 6.1 (L) 6.5 - 8.1 g/dL   Albumin 2.5 (L) 3.5 - 5.0 g/dL   AST 18 15 - 41 U/L   ALT 15 14 - 54 U/L   Alkaline Phosphatase 92 38 - 126 U/L   Total Bilirubin 0.3 0.3 - 1.2 mg/dL   GFR calc non Af Amer >60 >60 mL/min   GFR calc Af Amer >60 >60 mL/min   Anion gap 8 5 - 15    MDM Consult with Dr. Henderson Cloud - reviewed the patient's history and symptoms today.  Plan of care discussed.  Assessment and Plan  G1P0 at 31 weeks Likely episodes of tachycardia causing her symptoms  Plan Use deep cough as a technique to control tachycardia Call the office on Monday and say that Dr. Henderson Cloud want her to have a referral to a cardiologist for further evaluation. Keep your appointment as scheduled on Tuesday. Drink at least 8 8-oz glasses of water every day. Eat every 2-4 hours to avoid blood sugar dropping.   Melissa Sharp 06/10/2015, 4:49 PM

## 2015-06-10 NOTE — Discharge Instructions (Signed)
Use deep cough as a technique to control tachycardia Call the office on Monday and say that Dr. Henderson Cloudomblin want her to have a referral to a cardiologist for further evaluation. Keep your appointment as scheduled on Tuesday. Drink at least 8 8-oz glasses of water every day. Eat every 2-4 hours to avoid blood sugar dropping.

## 2015-06-10 NOTE — MAU Note (Signed)
Pt C/O dizziness that has been going on for "a while," has trouble getting air, feels SOB.  Sees floaters @ times, some edema in ankles, HR goes up with any exertion.  Has woke up several times in the last week SOB with heart racing.  Denies pain, bleeding or LOF.

## 2015-06-12 ENCOUNTER — Telehealth: Payer: Self-pay | Admitting: Neurology

## 2015-06-12 NOTE — Telephone Encounter (Signed)
That's fine. thanks

## 2015-06-12 NOTE — Telephone Encounter (Signed)
Dr Lucia GaskinsAhern- ok to write letter modifying for 5-6hr?

## 2015-06-12 NOTE — Telephone Encounter (Signed)
Called patient back. She stated she needs a different letter if at all possible. She stated she is able to work 5-6 hours with no problems. 8-9 hours causes her to have migraines. She said she does not need letter until later this week or I can mail it to her. Advised I will see if it's okay with Dr Lucia GaskinsAhern and call her back to let her know. She said its ok to LVM if she does not answer.

## 2015-06-12 NOTE — Telephone Encounter (Signed)
Pt called said she is doing well working 4 hours part time and would like to get that extended to 5 or 6 hrs day and for how ever long Dr Lucia GaskinsAhern thinks is best. She did work 8 hrs on Friday and ended up with migraine and nauseated all weekend. She missed a class last night because of HA. Please call pt with advise and mail letter to patient.

## 2015-06-13 ENCOUNTER — Encounter: Payer: Self-pay | Admitting: *Deleted

## 2015-06-13 NOTE — Telephone Encounter (Signed)
LVM for pt letting her know Dr Lucia GaskinsAhern was ok to write letter. Letter complete and signed. Will mail out tomorrow for her. Gave [facility] phone number if she has further questions.

## 2015-06-19 ENCOUNTER — Telehealth: Payer: Self-pay | Admitting: Neurology

## 2015-06-19 NOTE — Telephone Encounter (Signed)
Pt said she's had migraine since Sunday 06/18/15 that started in the afternoon. She did not go to work today. She has taken tylenol 325mg  every 6 hrs with no relief and ice pack on her head and lying in a dark room. She is now [redacted] wks pregnant. She has been seeing chiropractor about 1 month for neck and upper back and has an appt this afternoon.

## 2015-06-19 NOTE — Telephone Encounter (Signed)
Called pt back.Pt stated she went two Saturday's ago to ER d/t low BP and high pulse. Then went and saw OBGYN doctor week after.They said nothing significant was found.  She also tried calling OB office today and they took message and she was awaiting call back still. She has appt with them next week. She has not checked her BP today. Advised per Dr Lucia GaskinsAhern to try and take Reglan that was given. She states it only helped with nausea before but will try and take again to see how it helps. Made f/u for tomorrow at 2pm, check in 145pm. Advised Dr Lucia GaskinsAhern can try doing a nerve block to see if this will provide some relief. Pt states she was on her way to chiropractor to see if that will provide some relief as well. Told pt I will let Dr Lucia GaskinsAhern know. She verbalized understanding.

## 2015-06-20 ENCOUNTER — Ambulatory Visit (INDEPENDENT_AMBULATORY_CARE_PROVIDER_SITE_OTHER): Payer: BLUE CROSS/BLUE SHIELD | Admitting: Neurology

## 2015-06-20 VITALS — BP 130/80 | HR 97 | Ht 63.0 in | Wt 165.6 lb

## 2015-06-20 DIAGNOSIS — M542 Cervicalgia: Secondary | ICD-10-CM

## 2015-06-20 DIAGNOSIS — G500 Trigeminal neuralgia: Secondary | ICD-10-CM | POA: Diagnosis not present

## 2015-06-20 DIAGNOSIS — G4486 Cervicogenic headache: Secondary | ICD-10-CM

## 2015-06-20 DIAGNOSIS — R51 Headache: Secondary | ICD-10-CM

## 2015-06-20 DIAGNOSIS — G43011 Migraine without aura, intractable, with status migrainosus: Secondary | ICD-10-CM

## 2015-06-20 DIAGNOSIS — M5481 Occipital neuralgia: Secondary | ICD-10-CM

## 2015-06-20 NOTE — Progress Notes (Signed)
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  Return to work 06/26/15.
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
## 2015-06-20 NOTE — Progress Notes (Signed)
She has had a headache since Sunday. Sunday she was vomiting and diszzines, turned into a migrain on Sunday. Been persistent since Sunday. Other than this she has been doing well. Last headache was the previous week.  reglan not helping with the headache but helping with the nausea. Neck pain. Numbness in the left arm. Can do nerve blocks weekly until she gives birth.

## 2015-06-22 ENCOUNTER — Encounter: Payer: Self-pay | Admitting: Neurology

## 2015-06-22 ENCOUNTER — Ambulatory Visit (INDEPENDENT_AMBULATORY_CARE_PROVIDER_SITE_OTHER): Payer: BLUE CROSS/BLUE SHIELD | Admitting: Neurology

## 2015-06-22 DIAGNOSIS — M542 Cervicalgia: Secondary | ICD-10-CM | POA: Diagnosis not present

## 2015-06-22 DIAGNOSIS — G500 Trigeminal neuralgia: Secondary | ICD-10-CM

## 2015-06-22 DIAGNOSIS — G43011 Migraine without aura, intractable, with status migrainosus: Secondary | ICD-10-CM | POA: Diagnosis not present

## 2015-06-22 DIAGNOSIS — I878 Other specified disorders of veins: Secondary | ICD-10-CM | POA: Diagnosis not present

## 2015-06-22 DIAGNOSIS — M5481 Occipital neuralgia: Secondary | ICD-10-CM | POA: Diagnosis not present

## 2015-06-22 NOTE — Progress Notes (Signed)
Nerve Block:  Lidocaine 2%-4 mL Lot: [id number] Expiration 01/2019 NDC: 45409-811-9163323-486-27  Depo-medrol 80mg /mL-1 vial Lot: [id number] Expiration: 03/2016 NDC: 6213-0865-780009-3475-01  Marcaine 0.5%- 4mL Lot: [id number] Expiration: 10/16/2016 NDC: 4696-2952-840409-1162-01

## 2015-06-22 NOTE — Progress Notes (Addendum)
    NERVE BLOCK PROCEDURE NOTE  History: Patient here with intractable migraine, occipital and trigeminal pain and neuralgia  Procedure: Patient was consented for bilateral occipital and bilateral trigeminal and cervical trigger point injections and nerve blocks. A solution containing 0.5% 5mg /ml Bupivacaine 4-cc and 80mg  Depo Medrol 1cc and lidocaine 2% 4cc was prepared in 3 3-CC syringes with 30 gauge 1/2 inch needle.   12 Target areas in the occipital, suboccipital and temporal regions and 3 in the right cervical muscles were identified via palpation and pain response.The sites junctions were sterilized with alcohol wipes.  The contents of each syringe was injected in a fanlike fashion. The headache improved from 7/10 to 0/10. Patient tolerated the procedure well and no complications were noted.    Consent was provided below and patient acknowledged understanding:   Nerve Block:  Lidocaine 2% Lot: [id number]  Expiration 01/2019   NDC: 45409-811-9163323-486-27  Depo-medrol 80mg /mL-1 vial  Lot: [id number]  Expiration: 03/2016  NDC: 6213-0865-780009-3475-01   Marcaine 0.5%- Lot: [id number]  Expiration: 10/16/2016  NDC: 2841-3244-010409-1162-01    What to expect afterwards?  Immediately after the injection, the back of your head may feel warm and numb. You may also experience reduction in the pain. The local anaesthetic wears off in a few hours and the steroid usually takes  3-7 days to take effect.   The pain relief is vary variable and can last from a few days to several months. Some patients do not experience any pain relief. Hence it is difficult to predict the outcome of the injection treatment in a particular patient.   There may be some discomfort at the injection site for a couple of days after treatment, however, this should settle quite quickly. We advise you to take things easy for the rest of the day. Continue taking your pain medication as advised by your consultant or until you feel benefit from the treatment.    What are the side effects / complications?  Common   Soreness / bruising at the injection site.   Temporary increase (up to 7 days) in pain following procedure.   Rare   Bleeding   Infection at the injection site   Allergic reaction   New pain   Worsening pain

## 2015-06-23 ENCOUNTER — Telehealth: Payer: Self-pay | Admitting: *Deleted

## 2015-06-23 NOTE — Telephone Encounter (Signed)
Noted, thank you

## 2015-06-23 NOTE — Telephone Encounter (Signed)
Patient called back stating she does have the letter and thank you very much.

## 2015-06-23 NOTE — Telephone Encounter (Signed)
LVM asking if Dr Lucia GaskinsAhern gave her an updated letter or not. If not I can mail or have her pick up in office today before 12pm. Gave [facility] phone number.

## 2015-06-26 ENCOUNTER — Encounter: Payer: Self-pay | Admitting: Neurology

## 2015-06-26 DIAGNOSIS — M542 Cervicalgia: Secondary | ICD-10-CM | POA: Insufficient documentation

## 2015-06-27 DIAGNOSIS — D509 Iron deficiency anemia, unspecified: Secondary | ICD-10-CM | POA: Diagnosis not present

## 2015-06-27 DIAGNOSIS — D649 Anemia, unspecified: Secondary | ICD-10-CM | POA: Diagnosis not present

## 2015-06-29 ENCOUNTER — Telehealth: Payer: Self-pay | Admitting: Neurology

## 2015-06-29 NOTE — Telephone Encounter (Signed)
LMOM for pt. that per RAS, if she can't get here this afternoon, he will see her in the morning, at 0840, arrival time of 0830.  I have already added her to the schedule for tomorrow, as he only has that one appt. available.  If she can't get here, as long as she cancels by 0840, she should not be charged with a noshow./fim

## 2015-06-29 NOTE — Telephone Encounter (Signed)
Dr Epimenio FootSater- please advise, thank you!  Called pt back. Headache was getting better, but she only had pain on left side this time. She took some tylenol and laid down yesterday. She woke up and it turned into a migraine today. She is Only working 4 hr per day. She is not sure if stress at work/lights at work are exacerbating sx. Has tried reglan last night and took flexeril (1/2 tablet) this morning around 7am. Does not feel either helped. Advised Dr Lucia GaskinsAhern out of office this week and will not be back until Tuesday of next will. [facility] Dr Epimenio FootSater and call back to advise. She verbalized understanding.

## 2015-06-29 NOTE — Telephone Encounter (Signed)
If she continues to have pain, I can work her in to have trigger point injections like she had recently with Dr. Lucia GaskinsAhern

## 2015-06-29 NOTE — Telephone Encounter (Signed)
Patient called to advise, out of work yesterday and today with migraines. Wants to get another lidocaine injection.

## 2015-06-29 NOTE — Telephone Encounter (Signed)
I have spoken with [name] this afternoon and offered appt. with RAS.  She is not sure she can get a ride to our office today.  I have advised that if she can get a ride and can be here by 4:30pm, RAS will see her.  She has not been added to the schedule yet.   She does not need to call back--just if she can get here by 4:30/fim

## 2015-06-29 NOTE — Telephone Encounter (Signed)
Patient was advised, Dr. Lucia GaskinsAhern is out of the office this week.

## 2015-06-30 ENCOUNTER — Ambulatory Visit (INDEPENDENT_AMBULATORY_CARE_PROVIDER_SITE_OTHER): Payer: BLUE CROSS/BLUE SHIELD | Admitting: Neurology

## 2015-06-30 ENCOUNTER — Encounter: Payer: Self-pay | Admitting: Neurology

## 2015-06-30 VITALS — BP 134/89 | HR 112 | Ht 63.0 in | Wt 162.0 lb

## 2015-06-30 DIAGNOSIS — M542 Cervicalgia: Secondary | ICD-10-CM

## 2015-06-30 DIAGNOSIS — Z3490 Encounter for supervision of normal pregnancy, unspecified, unspecified trimester: Secondary | ICD-10-CM

## 2015-06-30 DIAGNOSIS — G43919 Migraine, unspecified, intractable, without status migrainosus: Secondary | ICD-10-CM | POA: Insufficient documentation

## 2015-06-30 DIAGNOSIS — G43019 Migraine without aura, intractable, without status migrainosus: Secondary | ICD-10-CM | POA: Diagnosis not present

## 2015-06-30 NOTE — Progress Notes (Signed)
[facility]  PATIENT: Melissa Sharp DOB: January 04, 1981     HISTORICAL  CHIEF COMPLAINT:  Chief Complaint  Patient presents with  . Follow-up    Room 13, alone. She is feeling a little better today. Sensitive to light. Nerve Block     HISTORY OF PRESENT ILLNESS:  Melissa Sharp is a 35 year old woman who is currently pregnant who has had intractable migraine headaches over the past month. Current headache is almost entirely on the left with a pounding character and nausea and photophobia. Moving the head makes the headache worse. She reports that she received a benefit for a weeks with recent trigger point injections/nerve blocks.   At that time, the headache was bilateral. Pain is much better on the right but she continues to have similar pain on the left.  She denies any change in vision, numbness, weakness, ataxia or changes in her bladder function.  ROS:  Out of a complete 14 system review of symptoms, the patient complains only of the following symptoms, and all other reviewed systems are negative.  She has had nausea.   ALLERGIES: Allergies  Allergen Reactions  . Hydrocodone-Acetaminophen Hives and Shortness Of Breath  . Shellfish Allergy Anaphylaxis  . Latex Hives    HOME MEDICATIONS:  Current outpatient prescriptions:  .  buPROPion (WELLBUTRIN XL) 150 MG 24 hr tablet, Take 1 tablet (150 mg total) by mouth daily., Disp: 30 tablet, Rfl: 0 .  cyclobenzaprine (FLEXERIL) 10 MG tablet, Take 5-10 mg by mouth 3 (three) times daily as needed for muscle spasms., Disp: , Rfl:  .  FLUoxetine (PROZAC) 40 MG capsule, Take 40 mg by mouth every evening., Disp: , Rfl:  .  IRON PO, Take 1 tablet by mouth daily., Disp: , Rfl:  .  levothyroxine (SYNTHROID, LEVOTHROID) 50 MCG tablet, take 1 tablet by mouth once daily (Patient taking differently: Take 50 mcg by mouth daily before breakfast. ), Disp: 30 tablet, Rfl: 1 .  metoCLOPramide (REGLAN) 10 MG tablet, Take 1  tablet (10 mg total) by mouth every 8 (eight) hours as needed for nausea., Disp: 30 tablet, Rfl: 6 .  Prenatal Vit-Fe Fumarate-FA (PRENATAL PO), Take 1 tablet by mouth daily., Disp: , Rfl:  .  vitamin B-12 (CYANOCOBALAMIN) 1000 MCG tablet, Take 1,000 mcg by mouth daily., Disp: , Rfl:   PAST MEDICAL HISTORY: Past Medical History  Diagnosis Date  . Migraine   . Depression   . Anxiety     PAST SURGICAL HISTORY: Past Surgical History  Procedure Laterality Date  . Tonsilectomy/adenoidectomy with myringotomy      FAMILY HISTORY: Family History  Problem Relation Age of Onset  . Alcohol abuse Father   . Hyperlipidemia    . Migraines Neg Hx     SOCIAL HISTORY:  Social History   Social History  . Marital Status: Married    Spouse Name: Ivin Booty  . Number of Children: 0  . Years of Education: N/A   Occupational History  . [employer]    Social History Main Topics  . Smoking status: Never Smoker   . Smokeless tobacco: Not on file  . Alcohol Use: Yes  . Drug Use: No  . Sexual Activity: Yes    Birth Control/ Protection: None   Other Topics Concern  . Not on file   Social History Narrative   Lives w/ husband        PHYSICAL EXAM  Filed Vitals:   06/30/15 0851  BP: 134/89  Pulse: 112  Height: 5\' 3"  (1.6 m)  Weight: 162 lb (73.483 kg)    Body mass index is 28.7 kg/(m^2).   General: The patient is well-developed and well-nourished and in no acute distress  Eyes:  Funduscopic exam shows normal optic discs and retinal vessels.  Neck: The neck is supple, no carotid bruits are noted.  The neck is tender L > R.  also tender over the left temporalis and occipitalis muscles  Neurologic Exam  Mental status: The patient is alert and oriented x 3 at the time of the examination. The patient has apparent normal recent and remote memory, with an apparently normal attention span and concentration ability.   Speech is normal.  Cranial nerves: Extraocular movements are  full. Pupils are equal, round, and reactive to light and accomodation. Facial symmetry is present. There is good facial sensation to soft touch bilaterally.Facial strength is normal.    Motor:  Muscle bulk is normal.   Tone is normal. Strength is  5 / 5 in all 4 extremities.   Gait and station: Station is normal.   Gait is normal. Tandem gait is normal.   Reflexes: Deep tendon reflexes are symmetric and normal bilaterally.     DIAGNOSTIC DATA (LABS, IMAGING, TESTING) - I reviewed patient records, labs, notes, testing and imaging myself where available.  Lab Results  Component Value Date   WBC 8.9 01/04/2015   HGB 13.5 01/04/2015   HCT 41 01/04/2015   MCV 91.4 08/29/2006   PLT 444* 01/04/2015      Component Value Date/Time   NA 139 06/10/2015 1659   K 3.7 06/10/2015 1659   CL 108 06/10/2015 1659   CO2 23 06/10/2015 1659   GLUCOSE 62* 06/10/2015 1659   BUN 6 06/10/2015 1659   CREATININE 0.57 06/10/2015 1659   CREATININE 0.82 12/01/2013 1126   CALCIUM 9.2 06/10/2015 1659   PROT 6.1* 06/10/2015 1659   ALBUMIN 2.5* 06/10/2015 1659   AST 18 06/10/2015 1659   ALT 15 06/10/2015 1659   ALKPHOS 92 06/10/2015 1659   BILITOT 0.3 06/10/2015 1659   GFRNONAA >60 06/10/2015 1659   GFRNONAA >89 12/01/2013 1126   GFRAA >60 06/10/2015 1659   GFRAA >89 12/01/2013 1126   Lab Results  Component Value Date   CHOL 315* 01/26/2014   HDL 116 01/26/2014   LDLCALC 119* 01/26/2014   TRIG 398* 01/26/2014   CHOLHDL 2.7 01/26/2014   No results found for: HGBA1C Lab Results  Component Value Date   VITAMINB12 343 10/27/2013   Lab Results  Component Value Date   TSH 0.28* 01/04/2015       ASSESSMENT AND PLAN  Cervical pain (neck)  Intractable migraine without aura and without status migrainosus  Pregnancy   1.   Trigger point inject splenius capitis, occipitalis and temporalis muscles and C2-C3 paraspinal muscles with 80 mg Medrol in 5 mL Marcaine. She tolerated the procedure  well and pain was better afterwards. 2.   Continue other medications. 3.    She will call back as needed.   Victorious Cosio A. Epimenio FootSater, MD, PhD 06/30/2015, 8:59 AM Certified in Neurology, Clinical Neurophysiology, Sleep Medicine, Pain Medicine and Neuroimaging  [facility] [address], [address] [place], [place] [address] [phone]

## 2015-07-05 ENCOUNTER — Telehealth: Payer: Self-pay | Admitting: Neurology

## 2015-07-05 NOTE — Telephone Encounter (Signed)
Pt called to request a lidocain injection/ appt with Dr. Lucia GaskinsAhern. Please call and advise [phone]

## 2015-07-05 NOTE — Telephone Encounter (Signed)
Called pt back. Advised per Dr Lucia GaskinsAhern she can come Friday at 12pm, check in 1130am for nerve block. Asked her to call back and let us know if this works. Gave [facility] phone number
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
## 2015-07-06 NOTE — Telephone Encounter (Signed)
Pt called said she will take Friday and be here at 11:30.

## 2015-07-06 NOTE — Telephone Encounter (Signed)
Schedule pt for Friday at 12pm for injections.

## 2015-07-07 ENCOUNTER — Ambulatory Visit (INDEPENDENT_AMBULATORY_CARE_PROVIDER_SITE_OTHER): Payer: BLUE CROSS/BLUE SHIELD | Admitting: Neurology

## 2015-07-07 VITALS — BP 128/89 | HR 91 | Ht 63.0 in

## 2015-07-07 DIAGNOSIS — R519 Headache, unspecified: Secondary | ICD-10-CM

## 2015-07-07 DIAGNOSIS — G43011 Migraine without aura, intractable, with status migrainosus: Secondary | ICD-10-CM | POA: Diagnosis not present

## 2015-07-07 DIAGNOSIS — R51 Headache: Secondary | ICD-10-CM | POA: Diagnosis not present

## 2015-07-07 DIAGNOSIS — G500 Trigeminal neuralgia: Secondary | ICD-10-CM | POA: Diagnosis not present

## 2015-07-07 NOTE — Progress Notes (Signed)
Nerve Block:  Lidocaine 2%-82mL total Lot: [id number] Expiration 01/2019 NDC: [id number]  Marcaine 0.5%- 2mL total Lot: [id number] Expiration: 10/16/2016 NDC: [id number]

## 2015-07-11 DIAGNOSIS — Z36 Encounter for antenatal screening of mother: Secondary | ICD-10-CM | POA: Diagnosis not present

## 2015-07-11 DIAGNOSIS — R030 Elevated blood-pressure reading, without diagnosis of hypertension: Secondary | ICD-10-CM | POA: Diagnosis not present

## 2015-07-13 ENCOUNTER — Telehealth: Payer: Self-pay | Admitting: Neurology

## 2015-07-13 NOTE — Telephone Encounter (Signed)
Patient is calling. She states she was supposed to get a note from Dr. Lucia GaskinsAhern stating for her to stay out of work until her baby comes. I advised Dr. Lucia GaskinsAhern is not in the office this week. She says she would like a call back from [place].

## 2015-07-13 NOTE — Telephone Encounter (Signed)
Called pt back. Apologized she has not received letter yet. I will check with Dr Lucia GaskinsAhern once she comes back in office. Advised I will be in office tomorrow 8-12pm tomorrow if she still wants to discuss further. Our office closes at 5pm tonight.

## 2015-07-15 ENCOUNTER — Encounter: Payer: Self-pay | Admitting: Neurology

## 2015-07-15 DIAGNOSIS — G43901 Migraine, unspecified, not intractable, with status migrainosus: Secondary | ICD-10-CM | POA: Insufficient documentation

## 2015-07-15 NOTE — Telephone Encounter (Signed)
Melissa Sharp, letter is in the chart. Please print and provide to me to sign and provide to patient thanks.
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
## 2015-07-17 NOTE — Telephone Encounter (Signed)
LVM for pt letting her know letter ready and I put in the mail today. I advised if she would like to pick up copy, call back and let me know. I can get that ready for her as well. Gave [facility] phone number.

## 2015-07-17 NOTE — Telephone Encounter (Signed)
Mailed letter today to pt.

## 2015-07-19 LAB — OB RESULTS CONSOLE GBS: STREP GROUP B AG: NEGATIVE

## 2015-07-21 ENCOUNTER — Encounter (HOSPITAL_COMMUNITY): Payer: Self-pay | Admitting: *Deleted

## 2015-07-21 ENCOUNTER — Inpatient Hospital Stay (HOSPITAL_COMMUNITY)
Admission: AD | Admit: 2015-07-21 | Discharge: 2015-07-21 | Disposition: A | Discharge status: Home / Self Care | Payer: BLUE CROSS/BLUE SHIELD | Source: Ambulatory Visit | Attending: Obstetrics and Gynecology | Admitting: Obstetrics and Gynecology

## 2015-07-21 DIAGNOSIS — Z91013 Allergy to seafood: Secondary | ICD-10-CM | POA: Diagnosis not present

## 2015-07-21 DIAGNOSIS — Z3A37 37 weeks gestation of pregnancy: Secondary | ICD-10-CM | POA: Diagnosis not present

## 2015-07-21 DIAGNOSIS — R030 Elevated blood-pressure reading, without diagnosis of hypertension: Secondary | ICD-10-CM | POA: Insufficient documentation

## 2015-07-21 DIAGNOSIS — O26893 Other specified pregnancy related conditions, third trimester: Secondary | ICD-10-CM

## 2015-07-21 DIAGNOSIS — R51 Headache: Secondary | ICD-10-CM

## 2015-07-21 DIAGNOSIS — O133 Gestational [pregnancy-induced] hypertension without significant proteinuria, third trimester: Secondary | ICD-10-CM

## 2015-07-21 DIAGNOSIS — R519 Headache, unspecified: Secondary | ICD-10-CM

## 2015-07-21 DIAGNOSIS — O163 Unspecified maternal hypertension, third trimester: Secondary | ICD-10-CM

## 2015-07-21 LAB — COMPREHENSIVE METABOLIC PANEL
ALT: 16 U/L (ref 14–54)
AST: 22 U/L (ref 15–41)
Albumin: 2.9 g/dL — ABNORMAL LOW (ref 3.5–5.0)
Alkaline Phosphatase: 196 U/L — ABNORMAL HIGH (ref 38–126)
Anion gap: 8 (ref 5–15)
BILIRUBIN TOTAL: 0.1 mg/dL — AB (ref 0.3–1.2)
BUN: 11 mg/dL (ref 6–20)
CHLORIDE: 108 mmol/L (ref 101–111)
CO2: 22 mmol/L (ref 22–32)
CREATININE: 0.78 mg/dL (ref 0.44–1.00)
Calcium: 9.3 mg/dL (ref 8.9–10.3)
Glucose, Bld: 106 mg/dL — ABNORMAL HIGH (ref 65–99)
POTASSIUM: 4 mmol/L (ref 3.5–5.1)
Sodium: 138 mmol/L (ref 135–145)
TOTAL PROTEIN: 6.4 g/dL — AB (ref 6.5–8.1)

## 2015-07-21 LAB — URINALYSIS, ROUTINE W REFLEX MICROSCOPIC
BILIRUBIN URINE: NEGATIVE
Glucose, UA: NEGATIVE mg/dL
HGB URINE DIPSTICK: NEGATIVE
KETONES UR: NEGATIVE mg/dL
Leukocytes, UA: NEGATIVE
NITRITE: NEGATIVE
PH: 6.5 (ref 5.0–8.0)
Protein, ur: NEGATIVE mg/dL

## 2015-07-21 LAB — CBC
HEMATOCRIT: 36.1 % (ref 36.0–46.0)
Hemoglobin: 11.8 g/dL — ABNORMAL LOW (ref 12.0–15.0)
MCH: 27.6 pg (ref 26.0–34.0)
MCHC: 32.7 g/dL (ref 30.0–36.0)
MCV: 84.3 fL (ref 78.0–100.0)
PLATELETS: 256 10*3/uL (ref 150–400)
RBC: 4.28 MIL/uL (ref 3.87–5.11)
RDW: 17.4 % — AB (ref 11.5–15.5)
WBC: 7 10*3/uL (ref 4.0–10.5)

## 2015-07-21 LAB — PROTEIN / CREATININE RATIO, URINE: Creatinine, Urine: 34 mg/dL

## 2015-07-21 MED ORDER — ACETAMINOPHEN 500 MG PO TABS
1000.0000 mg | ORAL_TABLET | Freq: Once | ORAL | Status: DC
  Filled 2015-07-21: qty 2

## 2015-07-21 NOTE — MAU Note (Signed)
Has had some high blood pressure, they have been monitoring it.  Racing pulse, dizziness, ringing in the ears, seeing the twinkles. Started having breathing problems started yesterday

## 2015-07-21 NOTE — MAU Note (Signed)
Urine sent to lab 

## 2015-07-21 NOTE — MAU Note (Signed)
Pt very upset on arrival, crying.  "did not want an ER charge".  Pulse was 138 on arrival

## 2015-07-21 NOTE — MAU Note (Signed)
Pulse from 98 up to 145 while in triage

## 2015-07-21 NOTE — MAU Provider Note (Signed)
History   [id number]   Chief Complaint  Patient presents with  . Headache    HPI Melissa Sharp is a 35 y.o. female  G1P0 at [redacted]w[redacted]d IUP here for feeling dizzy while going to an event.  Also reports headache, vision changes, and ringing in right ear that started yesterday.  Pt reports elevated blood pressures in office and labs being previously drawn.    Denies vaginal bleeding, leaking of fluid.  +occasional contractions.  +fetal movement.   No LMP recorded. Patient is pregnant.  OB History  Gravida Para Term Preterm AB SAB TAB Ectopic Multiple Living  1             # Outcome Date GA Lbr Len/2nd Weight Sex Delivery Anes PTL Lv  1 Current               Past Medical History  Diagnosis Date  . Migraine   . Depression   . Anxiety     Family History  Problem Relation Age of Onset  . Alcohol abuse Father   . Hyperlipidemia    . Migraines Neg Hx     Social History   Social History  . Marital Status: Married    Spouse Name: Ivin Booty  . Number of Children: 0  . Years of Education: N/A   Occupational History  . [employer]    Social History Main Topics  . Smoking status: Never Smoker   . Smokeless tobacco: None  . Alcohol Use: Yes  . Drug Use: No  . Sexual Activity: Yes    Birth Control/ Protection: None   Other Topics Concern  . None   Social History Narrative   Lives w/ husband       Allergies  Allergen Reactions  . Hydrocodone-Acetaminophen Hives and Shortness Of Breath  . Shellfish Allergy Anaphylaxis  . Latex Hives    No current facility-administered medications on file prior to encounter.   Current Outpatient Prescriptions on File Prior to Encounter  Medication Sig Dispense Refill  . buPROPion (WELLBUTRIN XL) 150 MG 24 hr tablet Take 1 tablet (150 mg total) by mouth daily. 30 tablet 0  . cyclobenzaprine (FLEXERIL) 10 MG tablet Take 5-10 mg by mouth 3 (three) times daily as needed for muscle spasms.    Marland Kitchen FLUoxetine (PROZAC) 40 MG capsule Take  40 mg by mouth every evening.    . IRON PO Take 1 tablet by mouth daily.    Marland Kitchen levothyroxine (SYNTHROID, LEVOTHROID) 50 MCG tablet take 1 tablet by mouth once daily (Patient taking differently: Take 50 mcg by mouth daily before breakfast. ) 30 tablet 1  . metoCLOPramide (REGLAN) 10 MG tablet Take 1 tablet (10 mg total) by mouth every 8 (eight) hours as needed for nausea. 30 tablet 6  . Prenatal Vit-Fe Fumarate-FA (PRENATAL PO) Take 1 tablet by mouth daily.    . vitamin B-12 (CYANOCOBALAMIN) 1000 MCG tablet Take 1,000 mcg by mouth daily.       Review of Systems  HENT: Positive for tinnitus.   Eyes: Positive for visual disturbance.  Genitourinary: Negative for vaginal bleeding and pelvic pain.  Neurological: Positive for headaches. Negative for dizziness.  All other systems reviewed and are negative.    Physical Exam   Filed Vitals:   07/21/15 1735 07/21/15 1736 07/21/15 1741 07/21/15 1743  BP: 143/89   139/96  Pulse: 108 117 112 105  Temp:      TempSrc:      Resp:  SpO2:  99% 99%    Filed Vitals:   07/21/15 1817 07/21/15 1831 07/21/15 1847 07/21/15 1901  BP: 135/88 132/90 124/87 124/91  Pulse: 97 102 97 101  Temp:      TempSrc:      Resp:      SpO2:        Physical Exam  Constitutional: She is oriented to person, place, and time. She appears well-developed and well-nourished. No distress.  HENT:  Head: Normocephalic.  Eyes: Pupils are equal, round, and reactive to light.  Neck: Normal range of motion. Neck supple.  Cardiovascular: Normal rate and regular rhythm.   Respiratory: Effort normal and breath sounds normal.  GI: Soft. There is no tenderness.  Genitourinary: No bleeding in the vagina.  Musculoskeletal: Normal range of motion. She exhibits edema (1+ bilat pedal edema).  Neurological: She is alert and oriented to person, place, and time. She displays abnormal reflex.  Skin: Skin is warm and dry.    MAU Course  Procedures  Results for orders placed or  performed during the hospital encounter of 07/21/15 (from the past 24 hour(s))  Protein / creatinine ratio, urine     Status: None   Collection Time: 07/21/15  5:25 PM  Result Value Ref Range   Creatinine, Urine 34.00 mg/dL   Total Protein, Urine <6 mg/dL   Protein Creatinine Ratio        0.00 - 0.15 mg/mg[Cre]  Urinalysis, Routine w reflex microscopic (not at [facility])     Status: Abnormal   Collection Time: 07/21/15  5:25 PM  Result Value Ref Range   Color, Urine YELLOW YELLOW   APPearance CLEAR CLEAR   Specific Gravity, Urine <1.005 (L) 1.005 - 1.030   pH 6.5 5.0 - 8.0   Glucose, UA NEGATIVE NEGATIVE mg/dL   Hgb urine dipstick NEGATIVE NEGATIVE   Bilirubin Urine NEGATIVE NEGATIVE   Ketones, ur NEGATIVE NEGATIVE mg/dL   Protein, ur NEGATIVE NEGATIVE mg/dL   Nitrite NEGATIVE NEGATIVE   Leukocytes, UA NEGATIVE NEGATIVE  CBC     Status: Abnormal   Collection Time: 07/21/15  6:09 PM  Result Value Ref Range   WBC 7.0 4.0 - 10.5 K/uL   RBC 4.28 3.87 - 5.11 MIL/uL   Hemoglobin 11.8 (L) 12.0 - 15.0 g/dL   HCT 40.9 81.1 - 91.4 %   MCV 84.3 78.0 - 100.0 fL   MCH 27.6 26.0 - 34.0 pg   MCHC 32.7 30.0 - 36.0 g/dL   RDW 78.2 (H) 95.6 - 21.3 %   Platelets 256 150 - 400 K/uL  Comprehensive metabolic panel     Status: Abnormal   Collection Time: 07/21/15  6:09 PM  Result Value Ref Range   Sodium 138 135 - 145 mmol/L   Potassium 4.0 3.5 - 5.1 mmol/L   Chloride 108 101 - 111 mmol/L   CO2 22 22 - 32 mmol/L   Glucose, Bld 106 (H) 65 - 99 mg/dL   BUN 11 6 - 20 mg/dL   Creatinine, Ser 0.86 0.44 - 1.00 mg/dL   Calcium 9.3 8.9 - 57.8 mg/dL   Total Protein 6.4 (L) 6.5 - 8.1 g/dL   Albumin 2.9 (L) 3.5 - 5.0 g/dL   AST 22 15 - 41 U/L   ALT 16 14 - 54 U/L   Alkaline Phosphatase 196 (H) 38 - 126 U/L   Total Bilirubin 0.1 (L) 0.3 - 1.2 mg/dL   GFR calc non Af Amer >60 >60 mL/min   GFR  calc Af Amer >60 >60 mL/min   Anion gap 8 5 - 15    MDM 1925 Dr. Vincente PoliGrewal notified reviewed HPI/exam/labs> give  2 extra strength Tylenol for headache and meal if hungry Pt declines Tylenol and food, reports headache resolved.  2026 Report given to [facility] who assumes care of patient.  Marlis EdelsonWalidah N Karim, CNM 07/21/2015 8:27 PM   2030 discussed labs and BP with Dr. Vincente PoliGrewal. Ok to dc home.   Assessment and Plan  35 y.o. G1P0 at 6110w1d IUP   A:  1. Headache in pregnancy, antepartum, third trimester   2. Elevated blood pressure affecting pregnancy in third trimester, antepartum    P:  Discharge home in stable condition  Follow up with the office on Monday for BP check.  Discussed risk of gestational HTN and preeclampsia Preeclampsia precautions Return to MAU if symptoms worsen Fetal kick counts.  Labor precautions.    Duane LopeJennifer I Rasch, NP 07/21/2015 9:18 PM

## 2015-07-21 NOTE — Discharge Instructions (Signed)

## 2015-07-24 DIAGNOSIS — Z36 Encounter for antenatal screening of mother: Secondary | ICD-10-CM | POA: Diagnosis not present

## 2015-07-24 DIAGNOSIS — O133 Gestational [pregnancy-induced] hypertension without significant proteinuria, third trimester: Secondary | ICD-10-CM | POA: Diagnosis not present

## 2015-07-26 ENCOUNTER — Telehealth: Payer: Self-pay | Admitting: Neurology

## 2015-07-26 NOTE — Telephone Encounter (Signed)
Dr Lucia GaskinsAhern- please advise. [facility].

## 2015-07-26 NOTE — Telephone Encounter (Signed)
error 

## 2015-07-26 NOTE — Telephone Encounter (Signed)
Patient reports migraine is stable, just a lingering HA. Pt sts she was tested for pre-eclampsia and she does not have it. She is wanting to discuss what will be the next step after baby arrives.

## 2015-07-26 NOTE — Telephone Encounter (Signed)
She should follow up with me in the office after baby arrives and we can discuss thanks

## 2015-07-27 DIAGNOSIS — O133 Gestational [pregnancy-induced] hypertension without significant proteinuria, third trimester: Secondary | ICD-10-CM | POA: Diagnosis not present

## 2015-07-27 DIAGNOSIS — Z3A38 38 weeks gestation of pregnancy: Secondary | ICD-10-CM | POA: Diagnosis not present

## 2015-07-27 NOTE — Telephone Encounter (Signed)
LVM for pt relaying Dr Trevor MaceAhern's message below. Advised her to call back and make f/u for tentative date or she can call and make f/u after she has her baby and is able to come in for an office visit to discuss next steps. Gave [facility] phone number.

## 2015-07-28 ENCOUNTER — Inpatient Hospital Stay (HOSPITAL_COMMUNITY): Payer: BLUE CROSS/BLUE SHIELD | Admitting: Anesthesiology

## 2015-07-28 ENCOUNTER — Inpatient Hospital Stay (HOSPITAL_COMMUNITY)
Admission: AD | Admit: 2015-07-28 | Discharge: 2015-07-31 | DRG: 775 | Disposition: A | Discharge status: Home / Self Care | Payer: BLUE CROSS/BLUE SHIELD | Source: Ambulatory Visit | Attending: Obstetrics and Gynecology | Admitting: Obstetrics and Gynecology

## 2015-07-28 ENCOUNTER — Encounter (HOSPITAL_COMMUNITY): Payer: Self-pay | Admitting: *Deleted

## 2015-07-28 DIAGNOSIS — O99284 Endocrine, nutritional and metabolic diseases complicating childbirth: Secondary | ICD-10-CM | POA: Diagnosis not present

## 2015-07-28 DIAGNOSIS — Z811 Family history of alcohol abuse and dependence: Secondary | ICD-10-CM | POA: Diagnosis not present

## 2015-07-28 DIAGNOSIS — O411230 Chorioamnionitis, third trimester, not applicable or unspecified: Secondary | ICD-10-CM | POA: Diagnosis not present

## 2015-07-28 DIAGNOSIS — Z3A38 38 weeks gestation of pregnancy: Secondary | ICD-10-CM | POA: Diagnosis not present

## 2015-07-28 DIAGNOSIS — O139 Gestational [pregnancy-induced] hypertension without significant proteinuria, unspecified trimester: Secondary | ICD-10-CM | POA: Diagnosis present

## 2015-07-28 DIAGNOSIS — O99344 Other mental disorders complicating childbirth: Secondary | ICD-10-CM | POA: Diagnosis present

## 2015-07-28 DIAGNOSIS — O411290 Chorioamnionitis, unspecified trimester, not applicable or unspecified: Secondary | ICD-10-CM | POA: Diagnosis present

## 2015-07-28 DIAGNOSIS — Z452 Encounter for adjustment and management of vascular access device: Secondary | ICD-10-CM | POA: Diagnosis not present

## 2015-07-28 DIAGNOSIS — O4103X0 Oligohydramnios, third trimester, not applicable or unspecified: Secondary | ICD-10-CM | POA: Diagnosis not present

## 2015-07-28 DIAGNOSIS — Z23 Encounter for immunization: Secondary | ICD-10-CM | POA: Diagnosis not present

## 2015-07-28 DIAGNOSIS — E039 Hypothyroidism, unspecified: Secondary | ICD-10-CM | POA: Diagnosis not present

## 2015-07-28 DIAGNOSIS — F418 Other specified anxiety disorders: Secondary | ICD-10-CM | POA: Diagnosis present

## 2015-07-28 HISTORY — DX: Hypothyroidism, unspecified: E03.9

## 2015-07-28 HISTORY — DX: Gestational (pregnancy-induced) hypertension without significant proteinuria, unspecified trimester: O13.9

## 2015-07-28 LAB — COMPREHENSIVE METABOLIC PANEL
ALBUMIN: 2.9 g/dL — AB (ref 3.5–5.0)
ALT: 19 U/L (ref 14–54)
AST: 23 U/L (ref 15–41)
Alkaline Phosphatase: 207 U/L — ABNORMAL HIGH (ref 38–126)
Anion gap: 10 (ref 5–15)
BUN: 9 mg/dL (ref 6–20)
CHLORIDE: 103 mmol/L (ref 101–111)
CO2: 22 mmol/L (ref 22–32)
CREATININE: 0.72 mg/dL (ref 0.44–1.00)
Calcium: 9.3 mg/dL (ref 8.9–10.3)
GFR calc Af Amer: >60 mL/min (ref >60)
GFR calc non Af Amer: >60 mL/min (ref >60)
GLUCOSE: 78 mg/dL (ref 65–99)
POTASSIUM: 4.3 mmol/L (ref 3.5–5.1)
Sodium: 135 mmol/L (ref 135–145)
Total Bilirubin: 0.6 mg/dL (ref 0.3–1.2)
Total Protein: 6 g/dL — ABNORMAL LOW (ref 6.5–8.1)

## 2015-07-28 LAB — CBC
HEMATOCRIT: 35.9 % — AB (ref 36.0–46.0)
HEMOGLOBIN: 11.9 g/dL — AB (ref 12.0–15.0)
MCH: 27.9 pg (ref 26.0–34.0)
MCHC: 33.1 g/dL (ref 30.0–36.0)
MCV: 84.1 fL (ref 78.0–100.0)
Platelets: 304 10*3/uL (ref 150–400)
RBC: 4.27 MIL/uL (ref 3.87–5.11)
RDW: 18.3 % — ABNORMAL HIGH (ref 11.5–15.5)
WBC: 6.1 10*3/uL (ref 4.0–10.5)

## 2015-07-28 LAB — TYPE AND SCREEN
ABO/RH(D): A POS
ANTIBODY SCREEN: NEGATIVE

## 2015-07-28 LAB — ABO/RH: ABO/RH(D): A POS

## 2015-07-28 LAB — URIC ACID: Uric Acid, Serum: 8 mg/dL — ABNORMAL HIGH (ref 2.3–6.6)

## 2015-07-28 MED ORDER — DIPHENHYDRAMINE HCL 50 MG/ML IJ SOLN: 12.5000 mg | INTRAMUSCULAR | Status: DC | PRN

## 2015-07-28 MED ORDER — FENTANYL 2.5 MCG/ML BUPIVACAINE 1/10 % EPIDURAL INFUSION (WH - ANES)
14.0000 mL/h | INTRAMUSCULAR | Status: DC | PRN
  Administered 2015-07-28 (×2): 14 mL/h via EPIDURAL
  Filled 2015-07-28 (×2): qty 125

## 2015-07-28 MED ORDER — LACTATED RINGERS IV SOLN: 500.0000 mL | Freq: Once | INTRAVENOUS | Status: DC

## 2015-07-28 MED ORDER — OXYTOCIN 40 UNITS IN LACTATED RINGERS INFUSION - SIMPLE MED: 1.0000 m[IU]/min | INTRAVENOUS | Status: DC

## 2015-07-28 MED ORDER — EPHEDRINE 5 MG/ML INJ
10.0000 mg | INTRAVENOUS | Status: DC | PRN
  Filled 2015-07-28: qty 2

## 2015-07-28 MED ORDER — LIDOCAINE HCL (PF) 1 % IJ SOLN
30.0000 mL | INTRAMUSCULAR | Status: DC | PRN
  Administered 2015-07-29: 30 mL via SUBCUTANEOUS
  Filled 2015-07-28: qty 30

## 2015-07-28 MED ORDER — LACTATED RINGERS IV SOLN
INTRAVENOUS | Status: DC
Start: 2015-07-28 — End: 2015-07-29
  Administered 2015-07-28 – 2015-07-29 (×2): via INTRAVENOUS

## 2015-07-28 MED ORDER — PHENYLEPHRINE 40 MCG/ML (10ML) SYRINGE FOR IV PUSH (FOR BLOOD PRESSURE SUPPORT)
80.0000 ug | PREFILLED_SYRINGE | INTRAVENOUS | Status: DC | PRN
  Filled 2015-07-28: qty 5
  Filled 2015-07-28: qty 10

## 2015-07-28 MED ORDER — OXYTOCIN 40 UNITS IN LACTATED RINGERS INFUSION - SIMPLE MED
2.5000 [IU]/h | INTRAVENOUS | Status: DC
Start: 2015-07-28 — End: 2015-07-29
  Administered 2015-07-29: 2.5 [IU]/h via INTRAVENOUS

## 2015-07-28 MED ORDER — OXYTOCIN 40 UNITS IN LACTATED RINGERS INFUSION - SIMPLE MED
1.0000 m[IU]/min | INTRAVENOUS | Status: DC
  Administered 2015-07-28: 1 m[IU]/min via INTRAVENOUS
  Filled 2015-07-28: qty 1000

## 2015-07-28 MED ORDER — ONDANSETRON HCL 4 MG/2ML IJ SOLN
4.0000 mg | Freq: Four times a day (QID) | INTRAMUSCULAR | Status: DC | PRN
  Filled 2015-07-28: qty 2

## 2015-07-28 MED ORDER — CITRIC ACID-SODIUM CITRATE 334-500 MG/5ML PO SOLN: 30.0000 mL | ORAL | Status: DC | PRN

## 2015-07-28 MED ORDER — FLEET ENEMA 7-19 GM/118ML RE ENEM: 1.0000 | ENEMA | RECTAL | Status: DC | PRN

## 2015-07-28 MED ORDER — OXYTOCIN BOLUS FROM INFUSION
500.0000 mL | INTRAVENOUS | Status: DC
  Administered 2015-07-29: 500 mL via INTRAVENOUS

## 2015-07-28 MED ORDER — TERBUTALINE SULFATE 1 MG/ML IJ SOLN: 0.2500 mg | Freq: Once | INTRAMUSCULAR | Status: DC | PRN

## 2015-07-28 MED ORDER — LIDOCAINE HCL (PF) 1 % IJ SOLN
INTRAMUSCULAR | Status: DC | PRN
  Administered 2015-07-28 (×2): 5 mL

## 2015-07-28 MED ORDER — LACTATED RINGERS IV SOLN: 500.0000 mL | INTRAVENOUS | Status: DC | PRN

## 2015-07-28 NOTE — H&P (Signed)
Garner GavelKristin Cihlar is a 35 y.o. female presenting for IOL for PIH and early labor. On 07/24/15 BP=130/90 and Cx=Cl/thick. Today BP=138/94, Cx 2-3/90/-2 with + bloody show and painful UCs. PNC complicated by AMA with normal Panorama, hypothyroidism and anxiety/depression.  Mild HA yesterday with ringing in ears. None today. + Hx of "migraine HA". Maternal Medical History:  Fetal activity: Perceived fetal activity is normal.      OB History    Gravida Para Term Preterm AB TAB SAB Ectopic Multiple Living   1              Past Medical History  Diagnosis Date  . Migraine   . Depression   . Anxiety    Past Surgical History  Procedure Laterality Date  . Tonsilectomy/adenoidectomy with myringotomy     Family History: family history includes Alcohol abuse in her father. There is no history of Migraines. Social History:  reports that she has never smoked. She does not have any smokeless tobacco history on file. She reports that she drinks alcohol. She reports that she does not use illicit drugs.   Prenatal Transfer Tool  Maternal Diabetes: No Genetic Screening: Normal Maternal Ultrasounds/Referrals: Normal Fetal Ultrasounds or other Referrals:  None Maternal Substance Abuse:  No Significant Maternal Medications:  None Significant Maternal Lab Results:  None Other Comments:  None  Review of Systems  HENT:       Mild HA yesterday, none today  Eyes: Negative for blurred vision.  Gastrointestinal: Negative for abdominal pain.    Dilation: 2.5 Effacement (%): 100 Station: -2 Exam by:: Glyn Gerads MD Blood pressure 148/77, pulse 86, temperature 98 F (36.7 C), temperature source Oral, resp. rate 20, height 5\' 4"  (1.626 m), weight 164 lb (74.39 kg). Maternal Exam:  Uterine Assessment: Contraction strength is firm.  Contraction frequency is irregular.   Abdomen: Patient reports no abdominal tenderness. Fetal presentation: vertex     Fetal Exam Fetal State Assessment: Category I -  tracings are normal.     Physical Exam  Cardiovascular: Normal rate and regular rhythm.   Respiratory: Effort normal and breath sounds normal.  GI: Soft. There is no tenderness.  Neurological: She has normal reflexes.    Prenatal labs: ABO, Rh: --/--/A POS (05/12 1340) Antibody: PENDING (05/12 1340) Rubella:   RPR: non reac (10/19 0000)  HBsAg:    HIV: neg (10/19 0000)  GBS:     Assessment/Plan: 35 yo G1P0 @ 38 1/7 weeks with PIH entering active labor D/W patient and husband above and recommend delivery D/W risks. Check labs D/W pitocin and risks if necessary Patient states she understands and agrees   Yaasir Menken II,Wilmont Olund E 07/28/2015, 2:40 PM

## 2015-07-28 NOTE — Progress Notes (Signed)
Cx 4/C/-2 FHT Cat one

## 2015-07-28 NOTE — Anesthesia Procedure Notes (Signed)
Epidural Patient location during procedure: OB  Staffing Anesthesiologist: Mercer Stallworth Performed by: anesthesiologist   Preanesthetic Checklist Completed: patient identified, site marked, surgical consent, pre-op evaluation, timeout performed, IV checked, risks and benefits discussed and monitors and equipment checked  Epidural Patient position: sitting Prep: DuraPrep Patient monitoring: heart rate, continuous pulse ox and blood pressure Approach: midline Location: L3-L4 Injection technique: LOR saline  Needle:  Needle type: Tuohy  Needle gauge: 17 G Needle length: 9 cm and 9 Needle insertion depth: 5 cm Catheter type: closed end flexible Catheter size: 20 Guage Catheter at skin depth: 10 cm Test dose: negative  Assessment Events: blood not aspirated, injection not painful, no injection resistance, negative IV test and no paresthesia  Additional Notes Patient identified. Risks/Benefits/Options discussed with patient including but not limited to bleeding, infection, nerve damage, paralysis, failed block, incomplete pain control, headache, blood pressure changes, nausea, vomiting, reactions to medication both or allergic, itching and postpartum back pain. Confirmed with bedside nurse the patient's most recent platelet count. Confirmed with patient that they are not currently taking any anticoagulation, have any bleeding history or any family history of bleeding disorders. Patient expressed understanding and wished to proceed. All questions were answered. Sterile technique was used throughout the entire procedure. Please see nursing notes for vital signs. Test dose was given through epidural needle and negative prior to continuing to dose epidural or start infusion. Warning signs of high block given to the patient including shortness of breath, tingling/numbness in hands, complete motor block, or any concerning symptoms with instructions to call for help. Patient was given  instructions on fall risk and not to get out of bed. All questions and concerns addressed with instructions to call with any issues.   

## 2015-07-28 NOTE — Anesthesia Preprocedure Evaluation (Signed)
Anesthesia Evaluation  Patient identified by MRN, date of birth, ID band Patient awake    Reviewed: Allergy & Precautions, H&P , NPO status , Patient's Chart, lab work & pertinent test results  History of Anesthesia Complications Negative for: history of anesthetic complications  Airway Mallampati: II  TM Distance: >3 FB Neck ROM: full    Dental no notable dental hx. (+) Teeth Intact   Pulmonary neg pulmonary ROS,    Pulmonary exam normal breath sounds clear to auscultation       Cardiovascular hypertension, negative cardio ROS Normal cardiovascular exam Rhythm:regular Rate:Normal     Neuro/Psych negative neurological ROS  negative psych ROS   GI/Hepatic negative GI ROS, Neg liver ROS,   Endo/Other  negative endocrine ROS  Renal/GU negative Renal ROS  negative genitourinary   Musculoskeletal   Abdominal   Peds  Hematology negative hematology ROS (+)   Anesthesia Other Findings   Reproductive/Obstetrics (+) Pregnancy                             Anesthesia Physical Anesthesia Plan  ASA: II  Anesthesia Plan: Epidural   Post-op Pain Management:    Induction:   Airway Management Planned:   Additional Equipment:   Intra-op Plan:   Post-operative Plan:   Informed Consent: I have reviewed the patients History and Physical, chart, labs and discussed the procedure including the risks, benefits and alternatives for the proposed anesthesia with the patient or authorized representative who has indicated his/her understanding and acceptance.     Plan Discussed with:   Anesthesia Plan Comments:         Anesthesia Quick Evaluation  

## 2015-07-28 NOTE — Progress Notes (Signed)
AROM clear

## 2015-07-28 NOTE — Progress Notes (Signed)
4/C/ -2/LOP FHT cat one IUPC placed Reposition

## 2015-07-28 NOTE — Progress Notes (Signed)
5/C/0 FHT cat one

## 2015-07-29 ENCOUNTER — Encounter (HOSPITAL_COMMUNITY): Payer: Self-pay | Admitting: *Deleted

## 2015-07-29 LAB — RPR: RPR Ser Ql: NONREACTIVE

## 2015-07-29 LAB — CBC
HCT: 31.3 % — ABNORMAL LOW (ref 36.0–46.0)
HEMOGLOBIN: 10.4 g/dL — AB (ref 12.0–15.0)
MCH: 27.7 pg (ref 26.0–34.0)
MCHC: 33.2 g/dL (ref 30.0–36.0)
MCV: 83.2 fL (ref 78.0–100.0)
Platelets: 257 10*3/uL (ref 150–400)
RBC: 3.76 MIL/uL — AB (ref 3.87–5.11)
RDW: 18 % — ABNORMAL HIGH (ref 11.5–15.5)
WBC: 16.2 10*3/uL — ABNORMAL HIGH (ref 4.0–10.5)

## 2015-07-29 MED ORDER — CYCLOBENZAPRINE HCL 5 MG PO TABS
5.0000 mg | ORAL_TABLET | Freq: Three times a day (TID) | ORAL | Status: DC | PRN
Start: 2015-07-29 — End: 2015-07-31
  Administered 2015-07-29: 5 mg via ORAL
  Administered 2015-07-30: 10 mg via ORAL
  Filled 2015-07-29 (×3): qty 2

## 2015-07-29 MED ORDER — PRENATAL MULTIVITAMIN CH
1.0000 | ORAL_TABLET | Freq: Every day | ORAL | Status: DC
  Administered 2015-07-29 – 2015-07-30 (×2): 1 via ORAL
  Filled 2015-07-29 (×2): qty 1

## 2015-07-29 MED ORDER — BUPIVACAINE HCL (PF) 0.25 % IJ SOLN
INTRAMUSCULAR | Status: DC | PRN
  Administered 2015-07-29: 3 mL via EPIDURAL

## 2015-07-29 MED ORDER — COCONUT OIL OIL
1.0000 "application " | TOPICAL_OIL | Status: DC | PRN
  Filled 2015-07-29: qty 120

## 2015-07-29 MED ORDER — ACETAMINOPHEN 325 MG PO TABS
650.0000 mg | ORAL_TABLET | ORAL | Status: DC | PRN
Start: 2015-07-29 — End: 2015-07-31

## 2015-07-29 MED ORDER — DIPHENHYDRAMINE HCL 25 MG PO CAPS: 25.0000 mg | ORAL_CAPSULE | Freq: Four times a day (QID) | ORAL | Status: DC | PRN

## 2015-07-29 MED ORDER — WITCH HAZEL-GLYCERIN EX PADS
1.0000 | MEDICATED_PAD | CUTANEOUS | Status: DC | PRN
Start: 2015-07-29 — End: 2015-07-31

## 2015-07-29 MED ORDER — LEVOTHYROXINE SODIUM 50 MCG PO TABS
50.0000 ug | ORAL_TABLET | Freq: Every day | ORAL | Status: DC
  Administered 2015-07-29 – 2015-07-31 (×3): 50 ug via ORAL
  Filled 2015-07-29 (×4): qty 1

## 2015-07-29 MED ORDER — OXYCODONE HCL 5 MG PO TABS
5.0000 mg | ORAL_TABLET | ORAL | Status: DC | PRN
  Administered 2015-07-29 – 2015-07-31 (×5): 5 mg via ORAL
  Filled 2015-07-29 (×6): qty 1

## 2015-07-29 MED ORDER — FLUOXETINE HCL 20 MG PO CAPS
40.0000 mg | ORAL_CAPSULE | Freq: Every evening | ORAL | Status: DC
  Administered 2015-07-29 – 2015-07-30 (×2): 40 mg via ORAL
  Filled 2015-07-29 (×3): qty 2

## 2015-07-29 MED ORDER — SODIUM BICARBONATE 8.4 % IV SOLN
INTRAVENOUS | Status: DC | PRN
  Administered 2015-07-29: 5 mL via EPIDURAL

## 2015-07-29 MED ORDER — BUPROPION HCL ER (XL) 150 MG PO TB24
150.0000 mg | ORAL_TABLET | Freq: Every day | ORAL | Status: DC
  Administered 2015-07-29 – 2015-07-31 (×3): 150 mg via ORAL
  Filled 2015-07-29 (×4): qty 1

## 2015-07-29 MED ORDER — IBUPROFEN 600 MG PO TABS
600.0000 mg | ORAL_TABLET | Freq: Four times a day (QID) | ORAL | Status: DC
  Administered 2015-07-29 – 2015-07-31 (×9): 600 mg via ORAL
  Filled 2015-07-29 (×9): qty 1

## 2015-07-29 MED ORDER — SIMETHICONE 80 MG PO CHEW: 80.0000 mg | CHEWABLE_TABLET | ORAL | Status: DC | PRN

## 2015-07-29 MED ORDER — SODIUM CHLORIDE 0.9 % IV SOLN
3.0000 g | Freq: Four times a day (QID) | INTRAVENOUS | Status: DC
  Administered 2015-07-29 – 2015-07-31 (×9): 3 g via INTRAVENOUS
  Filled 2015-07-29 (×12): qty 3

## 2015-07-29 MED ORDER — SENNOSIDES-DOCUSATE SODIUM 8.6-50 MG PO TABS
2.0000 | ORAL_TABLET | ORAL | Status: DC
  Administered 2015-07-30 (×2): 2 via ORAL
  Filled 2015-07-29 (×2): qty 2

## 2015-07-29 MED ORDER — BENZOCAINE-MENTHOL 20-0.5 % EX AERO
1.0000 | INHALATION_SPRAY | CUTANEOUS | Status: DC | PRN
Start: 2015-07-29 — End: 2015-07-31
  Administered 2015-07-30: 1 via TOPICAL
  Filled 2015-07-29 (×2): qty 56

## 2015-07-29 MED ORDER — ONDANSETRON HCL 4 MG PO TABS: 4.0000 mg | ORAL_TABLET | ORAL | Status: DC | PRN

## 2015-07-29 MED ORDER — ONDANSETRON HCL 4 MG/2ML IJ SOLN
4.0000 mg | INTRAMUSCULAR | Status: DC | PRN
Start: 2015-07-29 — End: 2015-07-31

## 2015-07-29 MED ORDER — DIBUCAINE 1 % RE OINT
1.0000 "application " | TOPICAL_OINTMENT | RECTAL | Status: DC | PRN
  Filled 2015-07-29: qty 28

## 2015-07-29 MED ORDER — ZOLPIDEM TARTRATE 5 MG PO TABS
5.0000 mg | ORAL_TABLET | Freq: Every evening | ORAL | Status: DC | PRN
  Administered 2015-07-30: 5 mg via ORAL
  Filled 2015-07-29 (×2): qty 1

## 2015-07-29 MED ORDER — TETANUS-DIPHTH-ACELL PERTUSSIS 5-2.5-18.5 LF-MCG/0.5 IM SUSP
0.5000 mL | Freq: Once | INTRAMUSCULAR | Status: DC
Start: 2015-07-30 — End: 2015-07-31

## 2015-07-29 NOTE — Progress Notes (Signed)
Operative Delivery Note At 4:34 AM a viable female was delivered via Vaginal, Spontaneous Delivery.  Presentation: vertex; Position: Left,, Occiput,, Anterior; Station: +3.  Verbal consent: obtained from patient.  Risks and benefits discussed in detail.  Risks include, but are not limited to the risks of anesthesia, bleeding, infection, damage to maternal tissues, fetal cephalhematoma.  There is also the risk of inability to effect vaginal delivery of the head, or shoulder dystocia that cannot be resolved by established maneuvers, leading to the need for emergency cesarean section.  Pushing 1.5-2.0 hours with progress. Vertex at +4 with push. Maternal temperature approximately 99 and FHT in 160-180s. D/W patient and husband possible evolving chorioamnionitis. Patient is tired and requests help. D/W VE. Foley catheter removed. Mushroom VE used, no pop offs, steady progress over 3-4 UCs. Loose nuchal cord x 1 reduced. Mild shoulder dystocia with hip flexion, MLE and gentle traction. Newborn HR >100, peds called due to poor cry.  APGAR: , ; weight  .   Placenta status:intact to pathology, .   Cord:  with the following complications: none.  Cord pH: pending  Anesthesia:  epidural Instruments: mushroom VE Episiotomy:  Second degree MLE with third degree extension, repaired and checked Lacerations:   Suture Repair: 2.0 vicryl rapide Est. Blood Loss (mL):  400  Mom to postpartum.  Baby to NICU. Will begin PP antibiotics.  Alayja Armas II,Charlsey Moragne E 07/29/2015, 5:02 AM

## 2015-07-29 NOTE — Anesthesia Postprocedure Evaluation (Signed)
Anesthesia Post Note  Patient: Melissa Sharp  Procedure(s) Performed: * No procedures listed *  Patient location during evaluation: Women's Unit Anesthesia Type: Epidural Level of consciousness: awake Pain management: pain level controlled Vital Signs Assessment: post-procedure vital signs reviewed and stable Respiratory status: spontaneous breathing Cardiovascular status: stable Postop Assessment: no headache, no backache, epidural receding, patient able to bend at knees, no signs of nausea or vomiting and adequate PO intake Anesthetic complications: no     Last Vitals:  Filed Vitals:   07/29/15 0709 07/29/15 0811  BP: 140/84 110/70  Pulse: 114 116  Temp: 37.1 C 37.1 C  Resp: 20 20    Last Pain:  Filed Vitals:   07/29/15 0816  PainSc: 0-No pain   Pain Goal: Patients Stated Pain Goal: 3 (07/29/15 0715)               Fanny DanceMULLINS,Melissa Dullea

## 2015-07-29 NOTE — Lactation Note (Signed)
This note was copied from a baby's chart. Lactation Consultation Note  Patient Name: Melissa Sharp ZOXWR'UToday's Date: 07/29/2015 Reason for consult: Initial assessment;NICU baby [facility] 7 [facility]. Baby was born at 8668w2d & weighed 7+12.2# at birth. Mom had recently returned from the NICU & had not been taught how to use pump. Mom reported she was ready to [facility] so showed mom & dad how to use & clean pump. Encouraged mom to use initiate setting until she gets ~20 mL 3 times in a row. Mom pumped & dad assisted. Mom received ~672mL of colostrum (nurse tech instructed to get labels for them to bring the milk to the NICU). Encouraged mom to pump every 3 hours & ok to go a longer stretch 1 time @ night if she desires. Did not review hand expression so pt would benefit from teaching. Provided BF booklet, resource guide, and feeding log; discussed lactation number & support group. Also reviewed milk storage guidelines & pumping guidelines in the NICU book. Neither mom or dad reported any other questions. Encouraged mom to ask nurse &/ or lactation if she has further questions.  Maternal Data    Feeding    LATCH Score/Interventions                      Lactation Tools Discussed/Used Pump Review: Setup, frequency, and cleaning;Milk Storage Initiated by:: Melissa Sharp, IBCLC Date initiated:: 07/29/15   Consult Status Consult Status: Follow-up Date: 07/30/15 Follow-up type: In-patient    [name] 07/29/2015, 1:59 PM

## 2015-07-29 NOTE — Progress Notes (Signed)
Called about 0110 for increased vaginal pain. Abdominal contractions are not currently painful.  Dosed with Lidocaine and bupivacaine.  At 0140, nurse reports patient is much better and sleeping.

## 2015-07-30 LAB — CBC
HCT: 28.5 % — ABNORMAL LOW (ref 36.0–46.0)
Hemoglobin: 9.2 g/dL — ABNORMAL LOW (ref 12.0–15.0)
MCH: 27.4 pg (ref 26.0–34.0)
MCHC: 32.3 g/dL (ref 30.0–36.0)
MCV: 84.8 fL (ref 78.0–100.0)
PLATELETS: 249 10*3/uL (ref 150–400)
RBC: 3.36 MIL/uL — ABNORMAL LOW (ref 3.87–5.11)
RDW: 18.6 % — AB (ref 11.5–15.5)
WBC: 17.6 10*3/uL — ABNORMAL HIGH (ref 4.0–10.5)

## 2015-07-30 NOTE — Lactation Note (Addendum)
This note was copied from a baby's chart. Lactation Consultation Note  Patient Name: Girl Garner GavelKristin Knowles ZOXWR'UToday's Date: 07/30/2015   [facility].  Her RN Selena BattenKim states Mom has been pumping regularly and transporting her EBM to NICU for baby.  To be discharged tomorrow.  [facility] that nurse can't answer.  To follow up 07/31/15.  Did visit with Mom in her room.  Mom with general questions.  Doing well and pumping regularly and transporting her milk to NICU.  Mom to rest as much as she can today.  Will need a pump on discharge, calling insurance tomorrow.  Judee ClaraSmith, Jaylenn Baiza E 07/30/2015, 10:49 AM

## 2015-07-30 NOTE — Progress Notes (Signed)
Post Partum Day 2 Subjective: up ad lib, voiding, tolerating PO and + flatus  Objective: Blood pressure 124/88, pulse 80, temperature 97.7 F (36.5 C), temperature source Oral, resp. rate 20, height 5\' 4"  (1.626 m), weight 164 lb (74.39 kg), SpO2 100 %, unknown if currently breastfeeding.  Physical Exam:  General: alert, cooperative and no distress Lochia: appropriate Uterine Fundus: firm Incision: healing well DVT Evaluation: No evidence of DVT seen on physical exam. Uterus NT  Results for orders placed or performed during the hospital encounter of 07/28/15 (from the past 24 hour(s))  CBC     Status: Abnormal   Collection Time: 07/30/15  5:39 AM  Result Value Ref Range   WBC 17.6 (H) 4.0 - 10.5 K/uL   RBC 3.36 (L) 3.87 - 5.11 MIL/uL   Hemoglobin 9.2 (L) 12.0 - 15.0 g/dL   HCT 95.628.5 (L) 21.336.0 - 08.646.0 %   MCV 84.8 78.0 - 100.0 fL   MCH 27.4 26.0 - 34.0 pg   MCHC 32.3 30.0 - 36.0 g/dL   RDW 57.818.6 (H) 46.911.5 - 62.915.5 %   Platelets 249 150 - 400 K/uL     Recent Labs  07/29/15 0528 07/30/15 0539  HGB 10.4* 9.2*  HCT 31.3* 28.5*    Assessment/Plan: Plan for discharge tomorrow  Chorioamnionitis - in view of elevated WBC, will continue IV antibiotics until tomorrw CBC in am   LOS: 2 days   Jereline Ticer II,Wilmetta Speiser E 07/30/2015, 8:29 AM

## 2015-07-30 NOTE — Progress Notes (Signed)
After chart review, CSW assesses that this patient may be screened out for continued services from social work. Pt is currently prescribed Prozac and Wellbutrin on an outpatient basis. No other acute needs at this time. Please call CSW if other needs arise.  Chad CordialLauren Carter, LCSWA Clinical Social Work [phone]

## 2015-07-31 ENCOUNTER — Ambulatory Visit: Payer: Self-pay

## 2015-07-31 LAB — CBC
HCT: 26 % — ABNORMAL LOW (ref 36.0–46.0)
Hemoglobin: 8.3 g/dL — ABNORMAL LOW (ref 12.0–15.0)
MCH: 27.3 pg (ref 26.0–34.0)
MCHC: 31.9 g/dL (ref 30.0–36.0)
MCV: 85.5 fL (ref 78.0–100.0)
Platelets: 283 10*3/uL (ref 150–400)
RBC: 3.04 MIL/uL — ABNORMAL LOW (ref 3.87–5.11)
RDW: 18.6 % — ABNORMAL HIGH (ref 11.5–15.5)
WBC: 14.4 10*3/uL — ABNORMAL HIGH (ref 4.0–10.5)

## 2015-07-31 MED ORDER — OXYCODONE HCL 5 MG PO TABS: 5.0000 mg | ORAL_TABLET | ORAL | Status: DC | PRN

## 2015-07-31 MED ORDER — IBUPROFEN 800 MG PO TABS: 800.0000 mg | ORAL_TABLET | Freq: Four times a day (QID) | ORAL | Status: DC | PRN

## 2015-07-31 NOTE — Discharge Summary (Signed)
Obstetric Discharge Summary Reason for Admission: induction of labor Prenatal Procedures: Preeclampsia Intrapartum Procedures: vacuum Postpartum Procedures: antibiotics Complications-Operative and Postpartum: 3rd degree perineal laceration HEMOGLOBIN  Date Value Ref Range Status  07/31/2015 8.3* 12.0 - 15.0 g/dL Final   HCT  Date Value Ref Range Status  07/31/2015 26.0* 36.0 - 46.0 % Final    Physical Exam:  General: alert, cooperative and appears stated age Lochia: appropriate Uterine Fundus: firm Incision: healing well, no significant drainage, no dehiscence DVT Evaluation: No evidence of DVT seen on physical exam. Negative Homan's sign. No cords or calf tenderness.  Discharge Diagnoses: Term Pregnancy-delivered  Discharge Information: Date: 07/31/2015 Activity: pelvic rest Diet: routine Medications: PNV, Ibuprofen and Percocet Condition: stable Instructions: refer to practice specific booklet Discharge to: home   Newborn Data: Live born female  Birth Weight: 7 lb 12.2 oz (3520 g) APGAR: 4, 7  Home with mother.  Melissa Sharp 07/31/2015, 10:04 AM

## 2015-07-31 NOTE — Lactation Note (Signed)
This note was copied from a baby's chart. Lactation Consultation Note  Patient Name: Melissa Sharp XBJYN'WToday's Date: 07/31/2015 Reason for consult: Follow-up assessment;NICU baby;Pump rental   Follow up with mom in NICU. Mom at infant's bedside and infant ready to feed. Infant was awake and alert. Assisted mom to latch infant to left breast in cross cradle hold. Infant latched very easily with flanged lips and rhythmic suckling for a brief time. She fell asleep quickly and was difficult to arouse to BF. We used awakening techniques and switched breast without success in awakening infant.  EBM was easily expressible and infant was fed 2 cc EBM via cup and bottle then finished formula feeding by bottle. Mom with compressible breasts and everted nipples. Colostrum noted on both sides.   Mom is very concerned that infant will not BF after bottle feeding. Enc her to BF infant at every opportunity to keep infant familiar and to keep pumping every 2-3 hours to get milk to come in. Mom was thrilled that infant latched immediately. Mom to be d/c home today. 2 week pump rental completed prior to d/c. Mom with a lot of family support from husband and maternal grandmother.   MOm has LC Brochure, she is aware of LC phone#, BF Support Groups and IP/OP Services. Enc family to call with questions/concerns prn.    Maternal Data Formula Feeding for Exclusion: No Has patient been taught Hand Expression?: Yes Does the patient have breastfeeding experience prior to this delivery?: No  Feeding Feeding Type: Breast Fed Length of feed: 5 min  LATCH Score/Interventions Latch: Repeated attempts needed to sustain latch, nipple held in mouth throughout feeding, stimulation needed to elicit sucking reflex. Intervention(s): Adjust position;Assist with latch;Breast massage;Breast compression  Audible Swallowing: None Intervention(s): Skin to skin;Hand expression  Type of Nipple: Everted at rest and after  stimulation  Comfort (Breast/Nipple): Soft / non-tender     Hold (Positioning): Assistance needed to correctly position infant at breast and maintain latch. Intervention(s): Breastfeeding basics reviewed;Support Pillows;Position options;Skin to skin  LATCH Score: 6  Lactation Tools Discussed/Used WIC Program: No Pump Review: Setup, frequency, and cleaning;Milk Storage   Consult Status Consult Status: PRN Follow-up type: Call as needed    Ed BlalockSharon S Ezabella Teska 07/31/2015, 2:21 PM

## 2015-07-31 NOTE — Lactation Note (Signed)
This note was copied from a baby's chart. Lactation Consultation Note  Patient Name: Girl Garner GavelKristin Culpepper ZOXWR'UToday's Date: 07/31/2015 Reason for consult: Follow-up assessment;NICU baby   Follow up with mom on 3rd floor. She reports she is pumping every 3 hours during the day and went 11 hours without pumping last night to rest. Advised pumping every 2-3 hours during the day with one 5-6 hour stretch at night without pumping. Mom reports her supply is decreased from yesterday, advised this can be normal and to maintain pumping to stimulate production. Infant is in NICU on phototherapy, mom has not been able to hold her STS or BF her, she has been asking to do so.   Mom is going home today and plans to call insurance company to see if she can get a pump. 2 week rental recommended until she can get her own pump, parents agreed. Pump rental papers left at bedside with phone # for parents to call when ready for pump. Follow up later today.   Maternal Data Has patient been taught Hand Expression?: Yes Does the patient have breastfeeding experience prior to this delivery?: No  Feeding    LATCH Score/Interventions                      Lactation Tools Discussed/Used WIC Program: No Pump Review: Setup, frequency, and cleaning;Milk Storage   Consult Status Consult Status: Follow-up Date: 07/31/15 Follow-up type: In-patient    Silas FloodSharon S Hice 07/31/2015, 11:39 AM

## 2015-07-31 NOTE — Discharge Instructions (Signed)
Call MD for T>100.4, heavy vaginal bleeding, severe abdominal pain, or respiratory distress.  Call office to schedule postpartum visit in 4-6 weeks.  No driving while taking narcotics.  Pelvic rest x 6 weeks. °

## 2015-07-31 NOTE — Progress Notes (Signed)
Discharge teaching complete. Pt understood all instructions and did not have any questions. Pt ambulated out of the hospital and discharged home to family.  

## 2015-07-31 NOTE — Progress Notes (Signed)
Post Partum Day 2 Subjective: no complaints, up ad lib, voiding and tolerating PO  Objective: Blood pressure 124/80, pulse 68, temperature 98.2 F (36.8 C), temperature source Oral, resp. rate 20, height 5\' 4"  (1.626 m), weight 164 lb (74.39 kg), SpO2 98 %, unknown if currently breastfeeding.  Physical Exam:  General: alert, cooperative and appears stated age Lochia: appropriate Uterine Fundus: firm Incision: healing well, no significant drainage, no dehiscence DVT Evaluation: No evidence of DVT seen on physical exam. Negative Homan's sign. No cords or calf tenderness.   Recent Labs  07/30/15 0539 07/31/15 0502  HGB 9.2* 8.3*  HCT 28.5* 26.0*    Assessment/Plan: Discharge home and Breastfeeding   LOS: 3 days   Melissa Sharp 07/31/2015, 10:04 AM

## 2015-08-01 DIAGNOSIS — Z452 Encounter for adjustment and management of vascular access device: Secondary | ICD-10-CM | POA: Diagnosis not present

## 2015-08-17 ENCOUNTER — Telehealth: Payer: Self-pay | Admitting: Neurology

## 2015-08-17 ENCOUNTER — Ambulatory Visit (INDEPENDENT_AMBULATORY_CARE_PROVIDER_SITE_OTHER): Payer: BLUE CROSS/BLUE SHIELD | Admitting: Neurology

## 2015-08-17 ENCOUNTER — Encounter: Payer: Self-pay | Admitting: Neurology

## 2015-08-17 VITALS — BP 130/80 | HR 77 | Ht 64.0 in | Wt 142.0 lb

## 2015-08-17 DIAGNOSIS — R51 Headache: Secondary | ICD-10-CM

## 2015-08-17 DIAGNOSIS — H539 Unspecified visual disturbance: Secondary | ICD-10-CM

## 2015-08-17 DIAGNOSIS — R519 Headache, unspecified: Secondary | ICD-10-CM

## 2015-08-17 MED ORDER — ONDANSETRON 4 MG PO TBDP: 4.0000 mg | ORAL_TABLET | Freq: Three times a day (TID) | ORAL | Status: DC | PRN

## 2015-08-17 MED ORDER — SUMATRIPTAN SUCCINATE 100 MG PO TABS: 100.0000 mg | ORAL_TABLET | Freq: Once | ORAL | Status: DC | PRN

## 2015-08-17 MED ORDER — GABAPENTIN 300 MG PO CAPS: 300.0000 mg | ORAL_CAPSULE | Freq: Three times a day (TID) | ORAL | Status: DC

## 2015-08-17 NOTE — Patient Instructions (Signed)
Remember to drink plenty of fluid, eat healthy meals and do not skip any meals. Try to eat protein with a every meal and eat a healthy snack such as fruit or nuts in between meals. Try to keep a regular sleep-wake schedule and try to exercise daily, particularly in the form of walking, 20-30 minutes a day, if you can.   As far as your medications are concerned, I would like to suggest:   Gabapentin 300mg  3x a day for migraine prevention Imitrex at onset of migraine and zofran if needed for nausea  As far as diagnostic testing: MRI of the brain  I would like to see you back in 4-6 months, sooner if we need to. Please call us with any interim questions, concerns, problems, updates or refill requests.   Our phone number is [phone]. We also have an after hours call service for urgent matters and there is a physician on-call for urgent questions. For any emergencies you know to call 911 or go to the nearest emergency room

## 2015-08-17 NOTE — Telephone Encounter (Signed)
Melissa Sharp, I spoke to her pediatrician and although we can't guarantee that any drug is safe for use in lactation, the drugs we discussed today appear to be likely compatible and ok to use. Stop for anything concerning. Please let her know thanks!
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
## 2015-08-17 NOTE — Telephone Encounter (Signed)
Called and spoke to pt. Relayed Dr Trevor MaceAhern's message below. She verbalized understanding. She will call with anything concerning.

## 2015-08-23 ENCOUNTER — Telehealth: Payer: Self-pay | Admitting: Neurology

## 2015-08-23 NOTE — Telephone Encounter (Signed)
Pt called about MRI cost and possible appt scheduling. Please call and advise

## 2015-08-30 ENCOUNTER — Ambulatory Visit (HOSPITAL_COMMUNITY)
Admission: RE | Admit: 2015-08-30 | Discharge: 2015-08-30 | Disposition: A | Discharge status: Home / Self Care | Payer: BLUE CROSS/BLUE SHIELD | Source: Ambulatory Visit | Attending: Obstetrics and Gynecology | Admitting: Obstetrics and Gynecology

## 2015-08-30 ENCOUNTER — Other Ambulatory Visit: Payer: BLUE CROSS/BLUE SHIELD

## 2015-08-30 NOTE — Lactation Note (Signed)
Lactation Consult  Mother's reason for visit: latching and baby falling asleep at the breast  Visit Type:  Feeding assessment  Appointment Notes:  Having latching difficulty , issues using the NS mom got from the Pediatrian  Erline HauJamilla Palmer MD, , decreased MS, 453 week old baby supplementing formula due to low weight gain , Pt. Confirmed appt. 6/14  Consult:  Initial Lactation Consultant:  Melissa Sharp, Melissa Sharp  ________________________________________________________________________ Baby's Name: Melissa Sharp Date of Birth: 07/29/2015 Pediatrician: [facility]  Gender: female Gestational Age: 6783w2d (At Birth) Birth Weight: 7 lb 12.2 oz (3520 g) Weight at Discharge: Weight: 7 lb 5.8 oz (3340 g)Date of Discharge: 08/03/2015 Filed Weights   08/02/15 0200 08/02/15 1600 08/03/15 1400  Weight: 7 lb 8.3 oz (3410 g) 7 lb 6.9 oz (3370 g) 7 lb 5.8 oz (3340 g)   Last weight taken [facility]: 6/12- 7-9 oz , per mom Dr.office  Weight today: 7-11 oz , 3490 g    _____________________________________________________________________  Mother's Name: Melissa Sharp Type of delivery:  Vaginal Delivery  Breastfeeding Experience:  1st baby -  Maternal Medical Conditions:  Thyroid , hx anxiety and depression  Maternal Medications:  B12, once a day ,PNV, Iron 2x's day , Bupropion 10 mg once day,  Levthroid 50 mg every day   ________________________________________________________________________  Breastfeeding History (Post Discharge) - 1st baby - per mom bay was in NICU right after delivery due to my high temp , and respiratory issues.  I started pumping and hand expressing with in a few hours after delivery and my milk was coming in the day after wards,.    Frequency of breastfeeding: as often as I can every 2-3 hours  Duration of feeding: 15 -20mins or longer   Supplementing::EBM and formula -   Pumping: DEBp  Medela - every 3 hours 1 oz left , 2 oz right .   Infant Intake and Output Assessment  Voids:  4-6 wets- clear in 24 hours  Stools: _4-6 yellow - brown stools in 24 hours   Maternal Breast Assessment  Breast:  Filling Nipple:  Erect Pain level:  0 Pain interventions:  Expressed breast milk  _______________________________________________________________________ Feeding Assessment/Evaluation  Initial feeding assessment:  Infant's oral assessment:    Positioning:  Cross cradle Left breast  LATCH documentation:  Latch:  2 = Grasps breast easily, tongue down, lips flanged, rhythmical sucking.  Audible swallowing:  2 = Spontaneous and intermittent  Type of nipple:  2 = Everted at rest and after stimulation  Comfort (Breast/Nipple):  1 = Filling, red/small blisters or bruises, mild/mod discomfort  Hold (Positioning):  1 = Assistance needed to correctly position infant at breast and maintain latch  LATCH score: 8   Attached assessment:  Shallow  Lips flanged:  Yes.    Lips untucked:  Yes.    Suck assessment:  Nutritive and Nonnutritive  Tools:  Nipple shield 24 mm Instructed on use and cleaning of tool:  Yes.    Pre-feed weight: 3490 g , 7-11.1 oz  Post-feed weight 3500 g , 7-11.5 oz  Amount transferred: 10 ml  Amount supplemented: none   Additional Feeding Assessment -   Infant's oral assessment:    Positioning:  Football Right breast  LATCH documentation:  Latch:  2 = Grasps breast easily, tongue down, lips flanged, rhythmical sucking.  Audible swallowing:  2 = Spontaneous and intermittent  Type of nipple:  2 = Everted at rest and after  stimulation  Comfort (Breast/Nipple):  1 = Filling, red/small blisters or bruises, mild/mod discomfort  Hold (Positioning):  1 = Assistance needed to correctly position infant at breast and maintain latch  LATCH score: 8   Attached assessment:  Shallow  Lips flanged:  Yes.    Lips untucked:  No.  Suck assessment:  Nutritive  and Nonnutritive  Tools:  Nipple shield 24 mm Instructed on use and cleaning of tool:  Yes.    Pre-feed weight: 3500g , 7-11.5 oz  Post-feed weight:  3516 g , 7-12.oz  Amount transferred:  16 ml  Amount supplemented:  30 ml    Total amount pumped post feed: did not post pump due to mom having time due to appt. Right after consult.   Total amount transferred: 26 ml  Total supplement given: 30 ml ( formula )  Total for feeding - 56 ml   ( baby had last fed at 0900 2 1/2 oz ) and consult was at 1045 ml - LC was surprised baby was hungry to feed  So 56 ml for the feeding was excellent, without spitting.   Lactation Plan of Care:  Hand out for low milk supply  Breastfeeding support group Mondays at 7 pm and Tuesday 's at 11am - recommended - mom declined F/U LC O/P  Recommended BFSG for weight checks  Breast feeding goals - To get Melissa Sharp gaining steadily and build up milk supply  Using the #24 NS  Option #1 - breast feed 1st breast 20 mins  Max and supplement - 2 1/2 - 3 oz afterwards  Post pump 10 -20 mins both breast  Option #2  Feed both breast 15 -20 mins - supplement afterwards  Post pump after 5-6 feedings a day 10-20 mins  Try Munchin nipple , avoid Avent for now, if challenged change to Dr. Manson Passey or Arta Bruce

## 2015-09-06 NOTE — Telephone Encounter (Signed)
Pt called to r/s MRI. Please call

## 2015-09-12 DIAGNOSIS — F411 Generalized anxiety disorder: Secondary | ICD-10-CM | POA: Diagnosis not present

## 2015-09-12 DIAGNOSIS — F3342 Major depressive disorder, recurrent, in full remission: Secondary | ICD-10-CM | POA: Diagnosis not present

## 2015-09-14 NOTE — Telephone Encounter (Signed)
[facility]. If she wants to reschedule she will have to call them. Their phone number is [phone]. Would you mind calling her and telling her this Sandi?

## 2015-09-21 DIAGNOSIS — E039 Hypothyroidism, unspecified: Secondary | ICD-10-CM | POA: Diagnosis not present

## 2015-09-21 DIAGNOSIS — Z1389 Encounter for screening for other disorder: Secondary | ICD-10-CM | POA: Diagnosis not present

## 2015-09-26 ENCOUNTER — Inpatient Hospital Stay: Admission: RE | Admit: 2015-09-26 | Payer: BLUE CROSS/BLUE SHIELD | Source: Ambulatory Visit

## 2015-10-03 DIAGNOSIS — Z3009 Encounter for other general counseling and advice on contraception: Secondary | ICD-10-CM | POA: Diagnosis not present

## 2015-10-03 DIAGNOSIS — N329 Bladder disorder, unspecified: Secondary | ICD-10-CM | POA: Diagnosis not present

## 2015-10-05 ENCOUNTER — Ambulatory Visit
Admission: RE | Admit: 2015-10-05 | Discharge: 2015-10-05 | Disposition: A | Discharge status: Home / Self Care | Payer: BLUE CROSS/BLUE SHIELD | Source: Ambulatory Visit | Attending: Neurology | Admitting: Neurology

## 2015-10-05 DIAGNOSIS — R519 Headache, unspecified: Secondary | ICD-10-CM

## 2015-10-05 DIAGNOSIS — H539 Unspecified visual disturbance: Secondary | ICD-10-CM | POA: Diagnosis not present

## 2015-10-05 DIAGNOSIS — G8929 Other chronic pain: Secondary | ICD-10-CM

## 2015-10-05 DIAGNOSIS — R51 Headache: Principal | ICD-10-CM

## 2015-10-09 ENCOUNTER — Telehealth: Payer: Self-pay | Admitting: *Deleted

## 2015-10-09 NOTE — Telephone Encounter (Signed)
LVM for patient informing her , per Dr Lucia Gaskins, her MRI brain results are normal. Left name, number for questions.

## 2015-10-17 DIAGNOSIS — R87612 Low grade squamous intraepithelial lesion on cytologic smear of cervix (LGSIL): Secondary | ICD-10-CM | POA: Diagnosis not present

## 2015-10-17 DIAGNOSIS — Z30430 Encounter for insertion of intrauterine contraceptive device: Secondary | ICD-10-CM | POA: Diagnosis not present

## 2015-10-17 DIAGNOSIS — N870 Mild cervical dysplasia: Secondary | ICD-10-CM | POA: Diagnosis not present

## 2015-11-13 ENCOUNTER — Telehealth (HOSPITAL_COMMUNITY): Payer: Self-pay | Admitting: Lactation Services

## 2015-11-13 NOTE — Telephone Encounter (Signed)
Lactation Telephone Call:  Garner GavelKristin Hollings gave permission via voicemail to leave message.  LC called her back and received her answering machine and left this message response  To her concern:  Concern : Need help , MS decreasing since returning back to work , taking Fenugreek, and other herbs  Wondering what else I can do.  LC responded on her answering machine - continue Fenugreek making sure the dose is therapeutic to increase MS  ( 3 capsules # times a day - recommended) , other herbs if recommended for increasing milk supply continue.  Google low milk supply and look for receipe for Lactation Cookies or bars. Added Steal cut oatmeal daly.  After feeding baby in am , offering 2nd breast , allow enough time to post pump 10 -15 mins to give your breast extra stimulation  Also try your best not to go over 3-4 hours without pumping both breast when away from your baby.  Reminder breast need to be stimulated at least 8 x's a day to protect milk supply.

## 2015-11-21 DIAGNOSIS — N938 Other specified abnormal uterine and vaginal bleeding: Secondary | ICD-10-CM | POA: Diagnosis not present

## 2015-11-21 DIAGNOSIS — Z3202 Encounter for pregnancy test, result negative: Secondary | ICD-10-CM | POA: Diagnosis not present

## 2015-11-21 DIAGNOSIS — Z30432 Encounter for removal of intrauterine contraceptive device: Secondary | ICD-10-CM | POA: Diagnosis not present

## 2015-11-21 DIAGNOSIS — Z3009 Encounter for other general counseling and advice on contraception: Secondary | ICD-10-CM | POA: Diagnosis not present

## 2015-11-21 DIAGNOSIS — R319 Hematuria, unspecified: Secondary | ICD-10-CM | POA: Diagnosis not present

## 2015-11-21 DIAGNOSIS — R102 Pelvic and perineal pain: Secondary | ICD-10-CM | POA: Diagnosis not present

## 2015-11-27 ENCOUNTER — Encounter: Payer: Self-pay | Admitting: Neurology

## 2015-11-28 DIAGNOSIS — Z23 Encounter for immunization: Secondary | ICD-10-CM | POA: Diagnosis not present

## 2015-11-30 DIAGNOSIS — Z3009 Encounter for other general counseling and advice on contraception: Secondary | ICD-10-CM | POA: Diagnosis not present

## 2015-11-30 DIAGNOSIS — R102 Pelvic and perineal pain: Secondary | ICD-10-CM | POA: Diagnosis not present

## 2015-12-06 ENCOUNTER — Telehealth (HOSPITAL_COMMUNITY): Payer: Self-pay | Admitting: Lactation Services

## 2015-12-06 NOTE — Telephone Encounter (Signed)
Mom has concerns about milk supply. Has had low supply since delivery. Baby now 234 months old. Her friend told her she might need to change her pump pieces. Has Medela pump and reports pump suction is good,. No need to change pump pieces. Has tried Fenugreek, Mother's milk tea, frequent pumping. Power pumping to increase milk supply. Encouragement given that some breast milk is always better than no breast milk. No further questions at present. To call back prn

## 2016-01-01 DIAGNOSIS — F4321 Adjustment disorder with depressed mood: Secondary | ICD-10-CM | POA: Diagnosis not present

## 2016-01-01 DIAGNOSIS — F331 Major depressive disorder, recurrent, moderate: Secondary | ICD-10-CM | POA: Diagnosis not present

## 2016-01-01 DIAGNOSIS — F411 Generalized anxiety disorder: Secondary | ICD-10-CM | POA: Diagnosis not present

## 2016-01-02 ENCOUNTER — Ambulatory Visit: Payer: BLUE CROSS/BLUE SHIELD | Admitting: Neurology

## 2016-01-10 DIAGNOSIS — F4321 Adjustment disorder with depressed mood: Secondary | ICD-10-CM | POA: Diagnosis not present

## 2016-01-12 ENCOUNTER — Ambulatory Visit (HOSPITAL_COMMUNITY): Payer: BLUE CROSS/BLUE SHIELD

## 2016-01-14 ENCOUNTER — Encounter: Payer: Self-pay | Admitting: Emergency Medicine

## 2016-01-14 ENCOUNTER — Emergency Department
Admission: EM | Admit: 2016-01-14 | Discharge: 2016-01-14 | Disposition: A | Discharge status: Home / Self Care | Payer: BLUE CROSS/BLUE SHIELD | Source: Home / Self Care | Attending: Family Medicine | Admitting: Family Medicine

## 2016-01-14 DIAGNOSIS — T63441A Toxic effect of venom of bees, accidental (unintentional), initial encounter: Secondary | ICD-10-CM

## 2016-01-14 MED ORDER — MUPIROCIN CALCIUM 2 % EX CREA: 1.0000 "application " | TOPICAL_CREAM | Freq: Three times a day (TID) | CUTANEOUS | 0 refills | Status: DC

## 2016-01-14 MED ORDER — METHYLPREDNISOLONE SODIUM SUCC 125 MG IJ SOLR
80.0000 mg | Freq: Once | INTRAMUSCULAR | Status: AC
  Administered 2016-01-14: 80 mg via INTRAMUSCULAR

## 2016-01-14 MED ORDER — TRIAMCINOLONE ACETONIDE 0.5 % EX CREA: 1.0000 "application " | TOPICAL_CREAM | Freq: Three times a day (TID) | CUTANEOUS | 0 refills | Status: DC

## 2016-01-14 NOTE — ED Triage Notes (Signed)
Pt c/o that she was stung by some type of bee, not sure either a hornet or a yellow jacket on Friday.  Stung on the inner left thigh, itching, no SOB, been taking Benadryl.

## 2016-01-14 NOTE — Discharge Instructions (Signed)
May continue antihistamine such as Benadryl, Zyrtec, etc.

## 2016-01-14 NOTE — ED Provider Notes (Signed)
[facility]    CSN: [id number] Arrival date & time: 01/14/16  1506     History   Chief Complaint Chief Complaint  Patient presents with  . Insect Bite    HPI Garner GavelKristin Pizano is a 35 y.o. female.   Patient reports that two days ago she was stung on her left inner thigh by a hornet or yellow jacket.  She has had persistent itching and redness.  No wheezing or shortness of breath.   The history is provided by the patient.  Trauma  Mechanism of injury comment:  Insect sting Injury location: left inner thight. Time since incident:  2 days Associated symptoms: no chest pain, no difficulty breathing, no headaches and no nausea   Risk factors: no asthma     Past Medical History:  Diagnosis Date  . Anxiety   . Depression   . Hypothyroidism   . Migraine     Patient Active Problem List   Diagnosis Date Noted  . PIH (pregnancy induced hypertension) 07/28/2015  . Migraine with status migrainosus 07/15/2015  . Intractable migraine 06/30/2015  . Cervical pain (neck) 06/26/2015  . Musculoskeletal neck pain 06/26/2015  . Post concussive syndrome 05/21/2015  . Nausea and vomiting during pregnancy 03/07/2015  . Dizziness 03/07/2015  . Dehydration 03/07/2015  . Pregnancy 01/24/2015  . Poor venous access 01/24/2015  . Viral gastroenteritis 01/24/2015  . Elevated liver enzymes 11/02/2013  . Migraine without aura, without mention of intractable migraine without mention of status migrainosus 10/29/2013  . Esophageal reflux 10/29/2013  . Depression, major, recurrent (HCC) 10/29/2013  . Mixed anxiety depressive disorder 10/29/2013  . B12 deficiency 10/29/2013  . Hypertriglyceridemia 10/29/2013  . IBS (irritable bowel syndrome) 10/27/2013  . Thyroid activity decreased 10/27/2013    Past Surgical History:  Procedure Laterality Date  . TONSILECTOMY/ADENOIDECTOMY WITH MYRINGOTOMY      OB History    Gravida Para Term Preterm AB Living   1 1 1     1    SAB TAB  Ectopic Multiple Live Births         0 1       Home Medications    Prior to Admission medications   Medication Sig Start Date End Date Taking? Authorizing Provider  mupirocin cream (BACTROBAN) 2 % Apply 1 application topically 3 (three) times daily. 01/14/16   Lattie HawStephen A Jourden Gilson, MD  triamcinolone cream (KENALOG) 0.5 % Apply 1 application topically 3 (three) times daily. 01/14/16   Lattie HawStephen A Saamiya Jeppsen, MD    Family History Family History  Problem Relation Age of Onset  . Alcohol abuse Father   . Hyperlipidemia    . Migraines Neg Hx     Social History Social History  Substance Use Topics  . Smoking status: Never Smoker  . Smokeless tobacco: Never Used  . Alcohol use Yes     Comment: 5 drinks a week     Allergies   Hydrocodone-acetaminophen; Shellfish allergy; and Latex   Review of Systems Review of Systems  Constitutional: Negative for chills, diaphoresis, fatigue and fever.  Cardiovascular: Negative for chest pain.  Gastrointestinal: Negative for nausea.  Neurological: Negative for headaches.  All other systems reviewed and are negative.    Physical Exam Triage Vital Signs ED Triage Vitals  Enc Vitals Group     BP 01/14/16 1533 138/92     Pulse Rate 01/14/16 1533 96     Resp --      Temp 01/14/16 1533 98.1 F (36.7 C)  Temp Source 01/14/16 1533 Oral     SpO2 01/14/16 1533 96 %     Weight 01/14/16 1533 149 lb (67.6 kg)     Height 01/14/16 1533 5\' 3"  (1.6 m)     Head Circumference --      Peak Flow --      Pain Score 01/14/16 1536 0     Pain Loc --      Pain Edu? --      Excl. in GC? --    No data found.   Updated Vital Signs BP 138/92 (BP Location: Left Arm)   Pulse 96   Temp 98.1 F (36.7 C) (Oral)   Ht 5\' 3"  (1.6 m)   Wt 149 lb (67.6 kg)   SpO2 96%   BMI 26.39 kg/m   Visual Acuity Right Eye Distance:   Left Eye Distance:   Bilateral Distance:    Right Eye Near:   Left Eye Near:    Bilateral Near:     Physical Exam  Constitutional:  She appears well-developed and well-nourished. No distress.  HENT:  Head: Atraumatic.  Right Ear: External ear normal.  Left Ear: External ear normal.  Nose: Nose normal.  Eyes: Conjunctivae are normal. Pupils are equal, round, and reactive to light.  Neck: Neck supple.  Cardiovascular: Normal heart sounds.   Pulmonary/Chest: Breath sounds normal.  Musculoskeletal: She exhibits no edema.  Lymphadenopathy:    She has no cervical adenopathy.  Neurological: She is alert.  Skin: Skin is warm and dry. Rash noted. Rash is macular.     Left anterior/medial thigh has a 3cm diameter patch of macular erythema without tenderness to palpation or induration/fluctuance.  Nursing note and vitals reviewed.    UC Treatments / Results  Labs (all labs ordered are listed, but only abnormal results are displayed) Labs Reviewed - No data to display  EKG  EKG Interpretation None       Radiology No results found.  Procedures Procedures (including critical care time)  Medications Ordered in UC Medications  methylPREDNISolone sodium succinate (SOLU-MEDROL) 125 mg/2 mL injection 80 mg (not administered)     Initial Impression / Assessment and Plan / UC Course  I have reviewed the triage vital signs and the nursing notes.  Pertinent labs & imaging results that were available during my care of the patient were reviewed by me and considered in my medical decision making (see chart for details).  Clinical Course   Administered Solumedrol 80mg  IM  Begin topical triamcinolone 0.5% cream TID. Doubt cellulitis but will begin as a precaution Bactroban cream TID May continue antihistamine such as Benadryl, Zyrtec, etc. Followup with Family Doctor if not improved in one week.      Final Clinical Impressions(s) / UC Diagnoses   Final diagnoses:  Bee sting reaction, accidental or unintentional, initial encounter    New Prescriptions New Prescriptions   MUPIROCIN CREAM (BACTROBAN) 2 %     Apply 1 application topically 3 (three) times daily.   TRIAMCINOLONE CREAM (KENALOG) 0.5 %    Apply 1 application topically 3 (three) times daily.     Lattie HawStephen A Jenalyn Girdner, MD 01/21/16 2215

## 2016-01-17 ENCOUNTER — Ambulatory Visit (HOSPITAL_COMMUNITY): Admission: RE | Admit: 2016-01-17 | Payer: BLUE CROSS/BLUE SHIELD | Source: Ambulatory Visit

## 2016-01-24 DIAGNOSIS — M5136 Other intervertebral disc degeneration, lumbar region: Secondary | ICD-10-CM | POA: Diagnosis not present

## 2016-01-24 DIAGNOSIS — M9903 Segmental and somatic dysfunction of lumbar region: Secondary | ICD-10-CM | POA: Diagnosis not present

## 2016-01-24 DIAGNOSIS — M9901 Segmental and somatic dysfunction of cervical region: Secondary | ICD-10-CM | POA: Diagnosis not present

## 2016-01-24 DIAGNOSIS — M5137 Other intervertebral disc degeneration, lumbosacral region: Secondary | ICD-10-CM | POA: Diagnosis not present

## 2016-01-29 ENCOUNTER — Ambulatory Visit (INDEPENDENT_AMBULATORY_CARE_PROVIDER_SITE_OTHER): Payer: BLUE CROSS/BLUE SHIELD | Admitting: Family Medicine

## 2016-01-29 VITALS — BP 150/94 | HR 91 | Temp 97.9°F | Wt 153.0 lb

## 2016-01-29 DIAGNOSIS — B9789 Other viral agents as the cause of diseases classified elsewhere: Secondary | ICD-10-CM | POA: Diagnosis not present

## 2016-01-29 DIAGNOSIS — R030 Elevated blood-pressure reading, without diagnosis of hypertension: Secondary | ICD-10-CM | POA: Diagnosis not present

## 2016-01-29 DIAGNOSIS — J069 Acute upper respiratory infection, unspecified: Secondary | ICD-10-CM

## 2016-01-29 MED ORDER — PREDNISONE 10 MG PO TABS: 30.0000 mg | ORAL_TABLET | Freq: Every day | ORAL | 0 refills | Status: DC

## 2016-01-29 MED ORDER — AMOXICILLIN 500 MG PO CAPS: 500.0000 mg | ORAL_CAPSULE | Freq: Three times a day (TID) | ORAL | 0 refills | Status: DC

## 2016-01-29 MED ORDER — IPRATROPIUM BROMIDE 0.06 % NA SOLN: 2.0000 | NASAL | 6 refills | Status: DC | PRN

## 2016-01-29 NOTE — Patient Instructions (Signed)
Thank you for coming in today. Take up to 1000 mg of acetaminophen over-the-counter every 6 hours for pain and fever. Use Atrovent nasal spray. If worsening take prednisone and amoxicillin.  [facility] in the near future.  Call or go to the emergency room if you get worse, have trouble breathing, have chest pains, or palpitations.    Upper Respiratory Infection, Adult Most upper respiratory infections (URIs) are a viral infection of the air passages leading to the lungs. A URI affects the nose, throat, and upper air passages. The most common type of URI is nasopharyngitis and is typically referred to as "the common cold." URIs run their course and usually go away on their own. Most of the time, a URI does not require medical attention, but sometimes a bacterial infection in the upper airways can follow a viral infection. This is called a secondary infection. Sinus and middle ear infections are common types of secondary upper respiratory infections. Bacterial pneumonia can also complicate a URI. A URI can worsen asthma and chronic obstructive pulmonary disease (COPD). Sometimes, these complications can require emergency medical care and may be life threatening.  CAUSES Almost all URIs are caused by viruses. A virus is a type of germ and can spread from one person to another.  RISKS FACTORS You may be at risk for a URI if:   You smoke.   You have chronic heart or lung disease.  You have a weakened defense (immune) system.   You are very young or very old.   You have nasal allergies or asthma.  You work in crowded or poorly ventilated areas.  You work in health care facilities or schools. SIGNS AND SYMPTOMS  Symptoms typically develop 2-3 days after you come in contact with a cold virus. Most viral URIs last 7-10 days. However, viral URIs from the influenza virus (flu virus) can last 14-18 days and are typically more severe. Symptoms may  include:   Runny or stuffy (congested) nose.   Sneezing.   Cough.   Sore throat.   Headache.   Fatigue.   Fever.   Loss of appetite.   Pain in your forehead, behind your eyes, and over your cheekbones (sinus pain).  Muscle aches.  DIAGNOSIS  Your health care provider may diagnose a URI by:  Physical exam.  Tests to check that your symptoms are not due to another condition such as:  Strep throat.  Sinusitis.  Pneumonia.  Asthma. TREATMENT  A URI goes away on its own with time. It cannot be cured with medicines, but medicines may be prescribed or recommended to relieve symptoms. Medicines may help:  Reduce your fever.  Reduce your cough.  Relieve nasal congestion. HOME CARE INSTRUCTIONS   Take medicines only as directed by your health care provider.   Gargle warm saltwater or take cough drops to comfort your throat as directed by your health care provider.  Use a warm mist humidifier or inhale steam from a shower to increase air moisture. This may make it easier to breathe.  Drink enough fluid to keep your urine clear or pale yellow.   Eat soups and other clear broths and maintain good nutrition.   Rest as needed.   Return to work when your temperature has returned to normal or as your health care provider advises. You may need to stay home longer to avoid infecting others. You can also use a face mask and careful hand washing to prevent spread of  the virus.  Increase the usage of your inhaler if you have asthma.   Do not use any tobacco products, including cigarettes, chewing tobacco, or electronic cigarettes. If you need help quitting, ask your health care provider. PREVENTION  The best way to protect yourself from getting a cold is to practice good hygiene.   Avoid oral or hand contact with people with cold symptoms.   Wash your hands often if contact occurs.  There is no clear evidence that vitamin C, vitamin E, echinacea, or  exercise reduces the chance of developing a cold. However, it is always recommended to get plenty of rest, exercise, and practice good nutrition.  SEEK MEDICAL CARE IF:   You are getting worse rather than better.   Your symptoms are not controlled by medicine.   You have chills.  You have worsening shortness of breath.  You have brown or red mucus.  You have yellow or brown nasal discharge.  You have pain in your face, especially when you bend forward.  You have a fever.  You have swollen neck glands.  You have pain while swallowing.  You have white areas in the back of your throat. SEEK IMMEDIATE MEDICAL CARE IF:   You have severe or persistent:  Headache.  Ear pain.  Sinus pain.  Chest pain.  You have chronic lung disease and any of the following:  Wheezing.  Prolonged cough.  Coughing up blood.  A change in your usual mucus.  You have a stiff neck.  You have changes in your:  Vision.  Hearing.  Thinking.  Mood. MAKE SURE YOU:   Understand these instructions.  Will watch your condition.  Will get help right away if you are not doing well or get worse.   This information is not intended to replace advice given to you by your health care provider. Make sure you discuss any questions you have with your health care provider.   Document Released: 08/28/2000 Document Revised: 07/19/2014 Document Reviewed: 06/09/2013 Elsevier Interactive Patient Education [employer]2016 Elsevier Inc.

## 2016-01-29 NOTE — Progress Notes (Signed)
       Garner GavelKristin Baller is a 35 y.o. female who [name] today for cough congestion and runny nose sinus pain and pressure. Symptoms present for 3 days. Patient has tried some over-the-counter cold medications which felt a bit. She denies any vomiting or diarrhea chest pain or palpitations. She currently is breast-feeding. She notes sick contacts at work.   Past Medical History:  Diagnosis Date  . Anxiety   . Depression   . Hypothyroidism   . Migraine    Past Surgical History:  Procedure Laterality Date  . TONSILECTOMY/ADENOIDECTOMY WITH MYRINGOTOMY     Social History  Substance Use Topics  . Smoking status: Never Smoker  . Smokeless tobacco: Never Used  . Alcohol use Yes     Comment: 5 drinks a week   family history includes Alcohol abuse in her father.  ROS as above:  Medications: Current Outpatient Prescriptions  Medication Sig Dispense Refill  . amoxicillin (AMOXIL) 500 MG capsule Take 1 capsule (500 mg total) by mouth 3 (three) times daily. 30 capsule 0  . ipratropium (ATROVENT) 0.06 % nasal spray Place 2 sprays into both nostrils every 4 (four) hours as needed for rhinitis. 10 mL 6  . predniSONE (DELTASONE) 10 MG tablet Take 3 tablets (30 mg total) by mouth daily with breakfast. 15 tablet 0   No current facility-administered medications for this visit.    Allergies  Allergen Reactions  . Hydrocodone-Acetaminophen Itching  . Shellfish Allergy Anaphylaxis  . Latex Hives    Health Maintenance Health Maintenance  Topic Date Due  . PAP SMEAR  04/24/2015  . INFLUENZA VACCINE  10/17/2015  . TETANUS/TDAP  10/28/2023  . HIV Screening  Completed     Exam:  BP (!) 150/94   Pulse 91   Temp 97.9 F (36.6 C) (Oral)   Wt 153 lb (69.4 kg)   BMI 27.10 kg/m  Gen: Well NAD HEENT: EOMI,  MMM Clear nasal discharge. Posterior pharynx with  cobblestoning. Normal tympanic membranes bilaterally. Lungs: Normal work of breathing. CTABL Heart: RRR no MRG Abd: NABS, Soft. Nondistended, Nontender Exts: Brisk capillary refill, warm and well perfused.    No results found for this or any previous visit (from the past 72 hour(s)). No results found.    Assessment and Plan: 35 y.o. female with  Viral URI. Treat symptomatically with Atrovent nasal spray and Tylenol. Use backup prednisone and amoxicillin if worsening or not improving. Discussed pros and cons of these medications with breast-feeding.  She has elevated blood pressure and history of pregnancy-induced hypertension. Recommend patient follow-up with PCP in a few weeks for recheck and reevaluation.   No orders of the defined types were placed in this encounter.   Discussed warning signs or symptoms. Please see discharge instructions. Patient expresses understanding.

## 2016-02-01 DIAGNOSIS — M5137 Other intervertebral disc degeneration, lumbosacral region: Secondary | ICD-10-CM | POA: Diagnosis not present

## 2016-02-01 DIAGNOSIS — M5136 Other intervertebral disc degeneration, lumbar region: Secondary | ICD-10-CM | POA: Diagnosis not present

## 2016-02-01 DIAGNOSIS — M9901 Segmental and somatic dysfunction of cervical region: Secondary | ICD-10-CM | POA: Diagnosis not present

## 2016-02-01 DIAGNOSIS — M9903 Segmental and somatic dysfunction of lumbar region: Secondary | ICD-10-CM | POA: Diagnosis not present

## 2016-02-15 ENCOUNTER — Ambulatory Visit: Payer: BLUE CROSS/BLUE SHIELD | Admitting: Sports Medicine

## 2016-02-20 ENCOUNTER — Encounter: Payer: Self-pay | Admitting: Physician Assistant

## 2016-02-20 ENCOUNTER — Ambulatory Visit (INDEPENDENT_AMBULATORY_CARE_PROVIDER_SITE_OTHER): Payer: BLUE CROSS/BLUE SHIELD | Admitting: Physician Assistant

## 2016-02-20 ENCOUNTER — Other Ambulatory Visit: Payer: Self-pay | Admitting: Physician Assistant

## 2016-02-20 VITALS — BP 116/74 | HR 89 | Ht 63.0 in | Wt 151.0 lb

## 2016-02-20 DIAGNOSIS — Z0000 Encounter for general adult medical examination without abnormal findings: Secondary | ICD-10-CM

## 2016-02-20 DIAGNOSIS — F329 Major depressive disorder, single episode, unspecified: Secondary | ICD-10-CM

## 2016-02-20 DIAGNOSIS — Z131 Encounter for screening for diabetes mellitus: Secondary | ICD-10-CM | POA: Diagnosis not present

## 2016-02-20 DIAGNOSIS — E781 Pure hyperglyceridemia: Secondary | ICD-10-CM | POA: Diagnosis not present

## 2016-02-20 DIAGNOSIS — F418 Other specified anxiety disorders: Secondary | ICD-10-CM

## 2016-02-20 DIAGNOSIS — M6208 Separation of muscle (nontraumatic), other site: Secondary | ICD-10-CM

## 2016-02-20 DIAGNOSIS — E039 Hypothyroidism, unspecified: Secondary | ICD-10-CM | POA: Diagnosis not present

## 2016-02-20 DIAGNOSIS — M5440 Lumbago with sciatica, unspecified side: Secondary | ICD-10-CM

## 2016-02-20 DIAGNOSIS — Z13220 Encounter for screening for lipoid disorders: Secondary | ICD-10-CM | POA: Diagnosis not present

## 2016-02-20 DIAGNOSIS — F419 Anxiety disorder, unspecified: Secondary | ICD-10-CM

## 2016-02-20 DIAGNOSIS — F32A Depression, unspecified: Secondary | ICD-10-CM

## 2016-02-20 DIAGNOSIS — M545 Low back pain, unspecified: Secondary | ICD-10-CM

## 2016-02-20 DIAGNOSIS — E663 Overweight: Secondary | ICD-10-CM

## 2016-02-20 MED ORDER — PHENTERMINE HCL 37.5 MG PO TABS: 37.5000 mg | ORAL_TABLET | Freq: Every day | ORAL | 0 refills | Status: DC

## 2016-02-20 NOTE — Patient Instructions (Signed)

## 2016-02-20 NOTE — Progress Notes (Signed)
Subjective:     Melissa Sharp is a 35 y.o. female and is here for a comprehensive physical exam. The patient reports problems - pt has a 297 month old infant and struggling to lose weight. she is trying to keep breast feeding but having to supplement with formula a lot and would like to stop pumping. .  Social History   Social History  . Marital status: Married    Spouse name: Ivin BootyJoshua  . Number of children: 0  . Years of education: N/A   Occupational History  . [employer]    Social History Main Topics  . Smoking status: Never Smoker  . Smokeless tobacco: Never Used  . Alcohol use Yes     Comment: 5 drinks a week  . Drug use: No  . Sexual activity: Yes    Birth control/ protection: None   Other Topics Concern  . Not on file   Social History Narrative   Lives w/ husband      Health Maintenance  Topic Date Due  . PAP SMEAR  04/24/2015  . TETANUS/TDAP  10/28/2023  . INFLUENZA VACCINE  Addressed  . HIV Screening  Completed    The following portions of the patient's history were reviewed and updated as appropriate: allergies, current medications, past family history, past medical history, past social history, past surgical history and problem list.  Review of Systems Pertinent items noted in HPI and remainder of comprehensive ROS otherwise negative.   Objective:    BP 116/74   Pulse 89   Ht 5\' 3"  (1.6 m)   Wt 151 lb (68.5 kg)   BMI 26.75 kg/m  General appearance: alert, cooperative and appears stated age Head: Normocephalic, without obvious abnormality, atraumatic Eyes: conjunctivae/corneas clear. PERRL, EOM's intact. Fundi benign. Ears: normal TM's and external ear canals both ears Nose: Nares normal. Septum midline. Mucosa normal. No drainage or sinus tenderness. Throat: lips, mucosa, and tongue normal; teeth and gums normal Neck: no adenopathy, no carotid bruit, no JVD, supple, symmetrical, trachea midline and thyroid not enlarged, symmetric, no  tenderness/mass/nodules Back: symmetric, no curvature. ROM normal. No CVA tenderness. Lungs: clear to auscultation bilaterally Heart: regular rate and rhythm, S1, S2 normal, no murmur, click, rub or gallop Abdomen: gap between rectus abdominal muscles with buldge with flexion.  Extremities: extremities normal, atraumatic, no cyanosis or edema Pulses: 2+ and symmetric Skin: Skin color, texture, turgor normal. No rashes or lesions Lymph nodes: Cervical, supraclavicular, and axillary nodes normal. Neurologic: Alert and oriented X 3, normal strength and tone. Normal symmetric reflexes. Normal coordination and gait    Assessment:    Healthy female exam.      Plan:    Marland Kitchen.Marland Kitchen.[name] was seen today for annual exam.  Diagnoses and all orders for this visit:  Routine physical examination -     Lipid panel -     COMPLETE METABOLIC PANEL WITH GFR  Screening for diabetes mellitus -     COMPLETE METABOLIC PANEL WITH GFR  Screening for lipid disorders -     Lipid panel  Hypertriglyceridemia -     Lipid panel  Diastasis recti  Low back pain with radiation, unspecified laterality  Hypothyroidism, unspecified type -     TSH -     T4, free  Overweight (BMI 25.0-29.9) -     phentermine (ADIPEX-P) 37.5 MG tablet; Take 1 tablet (37.5 mg total) by mouth daily before breakfast.  Anxiety and depression   Discussed 150 minutes of exercise a week.  1500 calories daily.  Make sure to STOP breastfeeding before starting phentermine.  Discussed side effects.  exercises given for Diastatis recti.  Follow up with back pain.    See After Visit Summary for Counseling Recommendations

## 2016-02-23 ENCOUNTER — Other Ambulatory Visit: Payer: Self-pay | Admitting: *Deleted

## 2016-02-23 DIAGNOSIS — M5440 Lumbago with sciatica, unspecified side: Secondary | ICD-10-CM

## 2016-02-23 DIAGNOSIS — M545 Low back pain, unspecified: Secondary | ICD-10-CM | POA: Insufficient documentation

## 2016-02-23 LAB — LIPID PANEL
CHOL/HDL RATIO: 4.1 ratio (ref <5.0)
CHOLESTEROL: 240 mg/dL — AB (ref <200)
HDL: 59 mg/dL (ref >50)
TRIGLYCERIDES: 611 mg/dL — AB (ref <150)

## 2016-02-23 LAB — COMPLETE METABOLIC PANEL WITH GFR
ALBUMIN: 4.8 g/dL (ref 3.6–5.1)
ALK PHOS: 78 U/L (ref 33–115)
ALT: 33 U/L — ABNORMAL HIGH (ref 6–29)
AST: 28 U/L (ref 10–30)
BUN: 8 mg/dL (ref 7–25)
CALCIUM: 10 mg/dL (ref 8.6–10.2)
CHLORIDE: 103 mmol/L (ref 98–110)
CO2: 22 mmol/L (ref 20–31)
Creat: 0.92 mg/dL (ref 0.50–1.10)
GFR, EST NON AFRICAN AMERICAN: 81 mL/min (ref >=60)
Glucose, Bld: 86 mg/dL (ref 65–99)
POTASSIUM: 3.9 mmol/L (ref 3.5–5.3)
Sodium: 140 mmol/L (ref 135–146)
Total Bilirubin: 0.5 mg/dL (ref 0.2–1.2)
Total Protein: 7.4 g/dL (ref 6.1–8.1)

## 2016-02-23 LAB — T4, FREE: FREE T4: 1.2 ng/dL (ref 0.8–1.8)

## 2016-02-23 LAB — TSH: TSH: 1.59 m[IU]/L

## 2016-02-23 LAB — LDL CHOLESTEROL, DIRECT: Direct LDL: 89 mg/dL (ref <130)

## 2016-02-23 MED ORDER — ICOSAPENT ETHYL 1 G PO CAPS: 2.0000 g | ORAL_CAPSULE | Freq: Two times a day (BID) | ORAL | 2 refills | Status: DC

## 2016-02-27 DIAGNOSIS — M6200 Separation of muscle (nontraumatic), unspecified site: Secondary | ICD-10-CM | POA: Diagnosis not present

## 2016-02-28 ENCOUNTER — Telehealth: Payer: Self-pay | Admitting: *Deleted

## 2016-02-28 NOTE — Telephone Encounter (Signed)
PA initiated for Vascepa through covermymeds [id number] - Rx #: [id number]

## 2016-02-29 ENCOUNTER — Telehealth: Payer: Self-pay

## 2016-02-29 NOTE — Telephone Encounter (Signed)
Patient was diagnosed with rectus abdominis diastasis and the GYN recommended surgery for treatment. She would like to know what [facility] would recommend. Please advise.

## 2016-02-29 NOTE — Telephone Encounter (Signed)
Yes I actually diagnosed her with that as well. I gave her exercises to do to strength and close the gap between abdominal muscles. I think targeting exercises for at least 4-6 weeks is prudent but I have no problem with referral to at least see a general surgeon who would perform surgery if no improvement.

## 2016-03-01 ENCOUNTER — Other Ambulatory Visit: Payer: Self-pay

## 2016-03-01 NOTE — Telephone Encounter (Signed)
Received determination from [employer] on CrossgateVascepa. They have denied coverage due to restricted access medications may be covered when 2 alternatives have been tried in this case only Lovaza has to be tried first.   Reference # [id number]

## 2016-03-01 NOTE — Telephone Encounter (Signed)
Left message advising of recommendations.  

## 2016-03-05 ENCOUNTER — Other Ambulatory Visit: Payer: Self-pay | Admitting: *Deleted

## 2016-03-05 MED ORDER — OMEGA-3-ACID ETHYL ESTERS 1 G PO CAPS: 2.0000 g | ORAL_CAPSULE | Freq: Two times a day (BID) | ORAL | 11 refills | Status: DC

## 2016-03-20 ENCOUNTER — Encounter: Payer: Self-pay | Admitting: Physician Assistant

## 2016-03-20 ENCOUNTER — Ambulatory Visit (INDEPENDENT_AMBULATORY_CARE_PROVIDER_SITE_OTHER): Payer: BLUE CROSS/BLUE SHIELD | Admitting: Physician Assistant

## 2016-03-20 VITALS — BP 128/88 | HR 98 | Ht 63.0 in | Wt 140.0 lb

## 2016-03-20 DIAGNOSIS — E781 Pure hyperglyceridemia: Secondary | ICD-10-CM

## 2016-03-20 DIAGNOSIS — J069 Acute upper respiratory infection, unspecified: Secondary | ICD-10-CM | POA: Diagnosis not present

## 2016-03-20 DIAGNOSIS — J0100 Acute maxillary sinusitis, unspecified: Secondary | ICD-10-CM

## 2016-03-20 DIAGNOSIS — E663 Overweight: Secondary | ICD-10-CM | POA: Diagnosis not present

## 2016-03-20 MED ORDER — OMEGA-3-ACID ETHYL ESTERS 1 G PO CAPS: 2.0000 g | ORAL_CAPSULE | Freq: Two times a day (BID) | ORAL | 11 refills | Status: DC

## 2016-03-20 MED ORDER — AMOXICILLIN-POT CLAVULANATE 875-125 MG PO TABS: 1.0000 | ORAL_TABLET | Freq: Two times a day (BID) | ORAL | 0 refills | Status: DC

## 2016-03-20 MED ORDER — PHENTERMINE HCL 37.5 MG PO TABS: 37.5000 mg | ORAL_TABLET | Freq: Every day | ORAL | 0 refills | Status: DC

## 2016-03-20 NOTE — Progress Notes (Signed)
   Subjective:    Patient ID: Melissa Sharp, female    DOB: 03/04/1981, 36 y.o.   MRN: [id number]  HPI  Pt is a 36 yo female who presents to the clinic for weight follow up after starting phentermine. She has been taking phentermine for one month. She stopped breastfeeding. She is walking but no real exercise. She does feel like she is eating less. Goal weight is 130. She last lost 4lbs this month. She has had no insomnia, palpitations, constipation, dry mouth. She is tolerating well.   She has had URI symptoms for last 10 days. Cough is productive. No SOB or wheezing. No fever or chills. She is having more headache and sinus pressure now. She has been taking mucinex and sudafed regularly with some relief. She has some ongoing ear pain and ST.      Review of Systems  All other systems reviewed and are negative.      Objective:   Physical Exam  Constitutional: She is oriented to person, place, and time. She appears well-developed and well-nourished.  HENT:  Head: Normocephalic and atraumatic.  Cardiovascular: Normal rate, regular rhythm and normal heart sounds.   Pulmonary/Chest: Effort normal and breath sounds normal.  Neurological: She is alert and oriented to person, place, and time.  Psychiatric: She has a normal mood and affect. Her behavior is normal.          Assessment & Plan:  Marland Kitchen.Marland Kitchen.Melissa Sharp was seen today for abnormal weight gain.  Diagnoses and all orders for this visit:  Overweight (BMI 25.0-29.9) -     phentermine (ADIPEX-P) 37.5 MG tablet; Take 1 tablet (37.5 mg total) by mouth daily before breakfast.  Acute upper respiratory infection  Acute non-recurrent maxillary sinusitis -     amoxicillin-clavulanate (AUGMENTIN) 875-125 MG tablet; Take 1 tablet by mouth 2 (two) times daily. For 10 days.  Hypertriglyceridemia -     omega-3 acid ethyl esters (LOVAZA) 1 g capsule; Take 2 capsules (2 g total) by mouth 2 (two) times daily.   Refilled phentermine. Nurse visit in  one month. Continue exercise. Discuss need for 4lb weight loss. 2nd recheck of vitals looked great.   Symptomatic care for maxillary sinusitis discussed.   lovaza sent due to insurance not paying for vascepa.

## 2016-03-21 ENCOUNTER — Other Ambulatory Visit: Payer: Self-pay | Admitting: *Deleted

## 2016-03-21 DIAGNOSIS — E781 Pure hyperglyceridemia: Secondary | ICD-10-CM

## 2016-03-21 MED ORDER — OMEGA-3-ACID ETHYL ESTERS 1 G PO CAPS: 2.0000 g | ORAL_CAPSULE | Freq: Two times a day (BID) | ORAL | 11 refills | Status: DC

## 2016-04-10 ENCOUNTER — Telehealth: Payer: Self-pay | Admitting: *Deleted

## 2016-04-10 ENCOUNTER — Ambulatory Visit: Payer: BLUE CROSS/BLUE SHIELD | Admitting: Physician Assistant

## 2016-04-10 NOTE — Telephone Encounter (Signed)
Patient called and states she is having mood swings and feels more anxious on the phentermine. Please advise

## 2016-04-10 NOTE — Telephone Encounter (Signed)
Has she tried cutting tablet in half?

## 2016-04-11 NOTE — Telephone Encounter (Signed)
Patient will try cutting the medication in half. If that does not work then she will let us know

## 2016-04-15 ENCOUNTER — Ambulatory Visit: Payer: BLUE CROSS/BLUE SHIELD | Admitting: Physician Assistant

## 2016-04-17 ENCOUNTER — Ambulatory Visit: Payer: BLUE CROSS/BLUE SHIELD

## 2016-04-23 ENCOUNTER — Ambulatory Visit: Payer: BLUE CROSS/BLUE SHIELD | Admitting: Physician Assistant

## 2016-04-29 ENCOUNTER — Ambulatory Visit: Payer: BLUE CROSS/BLUE SHIELD | Admitting: Physician Assistant

## 2016-04-30 ENCOUNTER — Other Ambulatory Visit: Payer: Self-pay | Admitting: Physician Assistant

## 2016-04-30 DIAGNOSIS — E663 Overweight: Secondary | ICD-10-CM

## 2016-05-01 ENCOUNTER — Encounter: Payer: Self-pay | Admitting: Physician Assistant

## 2016-05-01 ENCOUNTER — Ambulatory Visit (INDEPENDENT_AMBULATORY_CARE_PROVIDER_SITE_OTHER): Payer: BLUE CROSS/BLUE SHIELD | Admitting: Physician Assistant

## 2016-05-01 VITALS — BP 140/110 | HR 106 | Ht 63.0 in | Wt 138.0 lb

## 2016-05-01 DIAGNOSIS — M542 Cervicalgia: Secondary | ICD-10-CM

## 2016-05-01 DIAGNOSIS — R030 Elevated blood-pressure reading, without diagnosis of hypertension: Secondary | ICD-10-CM

## 2016-05-01 DIAGNOSIS — E663 Overweight: Secondary | ICD-10-CM

## 2016-05-01 DIAGNOSIS — E781 Pure hyperglyceridemia: Secondary | ICD-10-CM | POA: Diagnosis not present

## 2016-05-01 MED ORDER — CYCLOBENZAPRINE HCL 10 MG PO TABS: 10.0000 mg | ORAL_TABLET | Freq: Three times a day (TID) | ORAL | 1 refills | Status: DC | PRN

## 2016-05-01 NOTE — Patient Instructions (Signed)
Stop phentermine for now.  Will get MRI of neck.  Sent flexeril as muscle relaxer.    Acute Torticollis Torticollis is a condition in which the muscles of the neck tighten (contract) abnormally, causing the neck to twist and the head to move into an unnatural position. Torticollis that develops suddenly is called acute torticollis. If torticollis becomes chronic and is left untreated, the face and neck can become deformed. CAUSES This condition may be caused by:  Sleeping in an awkward position (common).  Extending or twisting the neck muscles beyond their normal position.  Infection. In some cases, the cause may not be known. SYMPTOMS Symptoms of this condition include:  An unnatural position of the head.  Neck pain.  A limited ability to move the neck.  Twisting of the neck to one side. DIAGNOSIS This condition is diagnosed with a physical exam. You may also have imaging tests, such as an X-ray, CT scan, or MRI. TREATMENT Treatment for this condition involves trying to relax the neck muscles. It may include:  Medicines or shots.  Physical therapy.  Surgery. This may be done in severe cases. HOME CARE INSTRUCTIONS  Take medicines only as directed by your health care provider.  Do stretching exercises and massage your neck as directed by your health care provider.  Keep all follow-up visits as directed by your health care provider. This is important. SEEK MEDICAL CARE IF:  You develop a fever. SEEK IMMEDIATE MEDICAL CARE IF:  You develop difficulty breathing.  You develop noisy breathing (stridor).  You start drooling.  You have trouble swallowing or have pain with swallowing.  You develop numbness or weakness in your hands or feet.  You have changes in your speech, understanding, or vision.  Your pain gets worse. This information is not intended to replace advice given to you by your health care provider. Make sure you discuss any questions you have with  your health care provider. Document Released: 03/01/2000 Document Revised: 06/26/2015 Document Reviewed: 02/28/2014 Elsevier Interactive Patient Education  2017 [employer]

## 2016-05-01 NOTE — Progress Notes (Signed)
   Subjective:    Patient ID: Melissa Sharp, female    DOB: 09/27/1980, 36 y.o.   MRN: [id number]  HPI  Pt is a 36 yo female who presents to the clinic for weight follow up and other concerns.   She has been on phentermine and lost total of 13lbs but only 2lbs in last month. Admits her mood has been off. Husband feels like she has been more angry. Denies any insomnia or palpitations. She is not exercising but eating a lot less.   She continues to have neck parin and radiation of pain down left side of neck. Seen chiorpracter for multiple sessions. Not tried PT. Tried muscle relaxer. Denies any numbness or tingling radiating into arms. She would like MRI to look at neck.   Hypertriglyceridemia- lovaza no coupon yet. vascepa stated not covered.   Review of Systems  All other systems reviewed and are negative.      Objective:   Physical Exam  Constitutional: She is oriented to person, place, and time. She appears well-developed and well-nourished.  HENT:  Head: Normocephalic and atraumatic.  Cardiovascular: Regular rhythm and normal heart sounds.   Tachycardia.   Pulmonary/Chest: Effort normal and breath sounds normal.  Musculoskeletal:  NROM of neck.  Left side of neck tender to palpation.  No cervical spine tenderness.  Full ROM of shoulder bilaterally.  Hand grip 5/5.    Neurological: She is alert and oriented to person, place, and time.  Skin: Skin is dry.  Psychiatric: She has a normal mood and affect. Her behavior is normal.          Assessment & Plan:  Marland Kitchen.Marland Kitchen.Diagnoses and all orders for this visit:  Overweight (BMI 25.0-29.9)  Hypertriglyceridemia -     Icosapent Ethyl 1 g CAPS; Take 2 tablets twice a day.  Cervical pain (neck) -     cyclobenzaprine (FLEXERIL) 10 MG tablet; Take 1 tablet (10 mg total) by mouth 3 (three) times daily as needed for muscle spasms. -     MR NECK WO CONTRAST; Future -     MR NECK WO CONTRAST  Elevated blood pressure  reading   Stopped phentermine. Perhaps causing mood changes and BP too elevated today. Not candidate for any other weight loss medications. On wellbutrin for mood and can help with weight.   Sent vascepa given card.   Ordered MRI. Flexeril to use as needed. NSAIDs as needed.   Recheck BP in 2 weeks if not improving will need to start medication.

## 2016-05-02 ENCOUNTER — Other Ambulatory Visit: Payer: Self-pay | Admitting: Physician Assistant

## 2016-05-02 DIAGNOSIS — E039 Hypothyroidism, unspecified: Secondary | ICD-10-CM

## 2016-05-02 MED ORDER — ICOSAPENT ETHYL 1 G PO CAPS: ORAL_CAPSULE | ORAL | 11 refills | Status: DC

## 2016-05-03 DIAGNOSIS — R030 Elevated blood-pressure reading, without diagnosis of hypertension: Secondary | ICD-10-CM | POA: Insufficient documentation

## 2016-05-14 DIAGNOSIS — M9901 Segmental and somatic dysfunction of cervical region: Secondary | ICD-10-CM | POA: Diagnosis not present

## 2016-05-14 DIAGNOSIS — M5136 Other intervertebral disc degeneration, lumbar region: Secondary | ICD-10-CM | POA: Diagnosis not present

## 2016-05-14 DIAGNOSIS — M5137 Other intervertebral disc degeneration, lumbosacral region: Secondary | ICD-10-CM | POA: Diagnosis not present

## 2016-05-14 DIAGNOSIS — M9903 Segmental and somatic dysfunction of lumbar region: Secondary | ICD-10-CM | POA: Diagnosis not present

## 2016-06-10 ENCOUNTER — Other Ambulatory Visit: Payer: BLUE CROSS/BLUE SHIELD

## 2016-06-11 ENCOUNTER — Telehealth: Payer: Self-pay | Admitting: *Deleted

## 2016-06-11 DIAGNOSIS — E039 Hypothyroidism, unspecified: Secondary | ICD-10-CM

## 2016-06-11 NOTE — Telephone Encounter (Signed)
Labs ordered to recheck thyroid levels due to increased fatigue.

## 2016-06-13 ENCOUNTER — Telehealth: Payer: Self-pay | Admitting: *Deleted

## 2016-06-13 DIAGNOSIS — R5383 Other fatigue: Secondary | ICD-10-CM

## 2016-06-13 LAB — T3, FREE: T3, Free: 2.6 pg/mL (ref 2.3–4.2)

## 2016-06-13 LAB — T4, FREE: Free T4: 1.3 ng/dL (ref 0.8–1.8)

## 2016-06-13 LAB — TSH: TSH: 1.27 m[IU]/L

## 2016-06-13 NOTE — Telephone Encounter (Signed)
Labs ordered per pt request

## 2016-06-18 ENCOUNTER — Encounter: Payer: Self-pay | Admitting: Physician Assistant

## 2016-06-18 DIAGNOSIS — M47892 Other spondylosis, cervical region: Secondary | ICD-10-CM | POA: Diagnosis not present

## 2016-06-18 DIAGNOSIS — M47812 Spondylosis without myelopathy or radiculopathy, cervical region: Secondary | ICD-10-CM | POA: Diagnosis not present

## 2016-06-18 DIAGNOSIS — R5383 Other fatigue: Secondary | ICD-10-CM | POA: Diagnosis not present

## 2016-06-19 LAB — FERRITIN: Ferritin: 28 ng/mL (ref 10–154)

## 2016-06-19 LAB — CBC
HEMATOCRIT: 41.7 % (ref 35.0–45.0)
HEMOGLOBIN: 13.7 g/dL (ref 11.7–15.5)
MCH: 30.2 pg (ref 27.0–33.0)
MCHC: 32.9 g/dL (ref 32.0–36.0)
MCV: 92.1 fL (ref 80.0–100.0)
MPV: 10.1 fL (ref 7.5–12.5)
Platelets: 355 10*3/uL (ref 140–400)
RBC: 4.53 MIL/uL (ref 3.80–5.10)
RDW: 14.6 % (ref 11.0–15.0)
WBC: 3.6 10*3/uL — AB (ref 3.8–10.8)

## 2016-06-27 DIAGNOSIS — D239 Other benign neoplasm of skin, unspecified: Secondary | ICD-10-CM | POA: Diagnosis not present

## 2016-06-27 DIAGNOSIS — L2084 Intrinsic (allergic) eczema: Secondary | ICD-10-CM | POA: Diagnosis not present

## 2016-06-27 DIAGNOSIS — L719 Rosacea, unspecified: Secondary | ICD-10-CM | POA: Diagnosis not present

## 2016-07-23 ENCOUNTER — Telehealth: Payer: Self-pay | Admitting: Physician Assistant

## 2016-07-23 NOTE — Telephone Encounter (Signed)
Please review C-Spine MRI that is scanned in from [facility]. Pt has not been advised of results.

## 2016-07-24 NOTE — Telephone Encounter (Signed)
Call pt: there is some minimal degenerative changes at c4/5/6. No narrowing of spine. How is your pain? I think you would benefit from PT? Would you consider?

## 2016-07-24 NOTE — Telephone Encounter (Signed)
Report is scanned under the media tab from 07/13/16
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
## 2016-07-24 NOTE — Telephone Encounter (Signed)
Can you find it in the system? I looked in care everywhere and did not see it.
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
## 2016-09-10 DIAGNOSIS — F341 Dysthymic disorder: Secondary | ICD-10-CM | POA: Diagnosis not present

## 2016-09-10 DIAGNOSIS — F411 Generalized anxiety disorder: Secondary | ICD-10-CM | POA: Diagnosis not present

## 2016-09-30 ENCOUNTER — Telehealth: Payer: Self-pay

## 2016-09-30 DIAGNOSIS — M542 Cervicalgia: Secondary | ICD-10-CM
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
## 2016-09-30 NOTE — Telephone Encounter (Signed)
Order for physical therapy placed. Please follow up with sports medicine (me) in 6 weeks especially if not better.

## 2016-09-30 NOTE — Telephone Encounter (Signed)
Pt notified no questions or concerns at this time.  

## 2016-10-09 ENCOUNTER — Ambulatory Visit: Payer: BLUE CROSS/BLUE SHIELD | Admitting: Physical Therapy

## 2016-10-15 DIAGNOSIS — M9901 Segmental and somatic dysfunction of cervical region: Secondary | ICD-10-CM | POA: Diagnosis not present

## 2016-10-15 DIAGNOSIS — M5136 Other intervertebral disc degeneration, lumbar region: Secondary | ICD-10-CM | POA: Diagnosis not present

## 2016-10-15 DIAGNOSIS — M5137 Other intervertebral disc degeneration, lumbosacral region: Secondary | ICD-10-CM | POA: Diagnosis not present

## 2016-10-15 DIAGNOSIS — M9903 Segmental and somatic dysfunction of lumbar region: Secondary | ICD-10-CM | POA: Diagnosis not present

## 2016-10-16 ENCOUNTER — Encounter: Payer: Self-pay | Admitting: Physical Therapy

## 2016-10-16 ENCOUNTER — Ambulatory Visit (INDEPENDENT_AMBULATORY_CARE_PROVIDER_SITE_OTHER): Payer: BLUE CROSS/BLUE SHIELD | Admitting: Physical Therapy

## 2016-10-16 DIAGNOSIS — R29898 Other symptoms and signs involving the musculoskeletal system: Secondary | ICD-10-CM

## 2016-10-16 DIAGNOSIS — M542 Cervicalgia: Secondary | ICD-10-CM | POA: Diagnosis not present

## 2016-10-16 NOTE — Patient Instructions (Addendum)
Trigger Point Dry Needling  . What is Trigger Point Dry Needling (DN)? o DN is a physical therapy technique used to treat muscle pain and dysfunction. Specifically, DN helps deactivate muscle trigger points (muscle knots).  o A thin filiform needle is used to penetrate the skin and stimulate the underlying trigger point. The goal is for a local twitch response (LTR) to occur and for the trigger point to relax. No medication of any kind is injected during the procedure.   . What Does Trigger Point Dry Needling Feel Like?  o The procedure feels different for each individual patient. Some patients report that they do not actually feel the needle enter the skin and overall the process is not painful. Very mild bleeding may occur. However, many patients feel a deep cramping in the muscle in which the needle was inserted. This is the local twitch response.   Marland Kitchen. How Will I feel after the treatment? o Soreness is normal, and the onset of soreness may not occur for a few hours. Typically this soreness does not last longer than two days.  o Bruising is uncommon, however; ice can be used to decrease any possible bruising.  o In rare cases feeling tired or nauseous after the treatment is normal. In addition, your symptoms may get worse before they get better, this period will typically not last longer than 24 hours.   . What Can I do After My Treatment? o Increase your hydration by drinking more water for the next 24 hours. o You may place ice or heat on the areas treated that have become sore, however, do not use heat on inflamed or bruised areas. Heat often brings more relief post needling. o You can continue your regular activities, but vigorous activity is not recommended initially after the treatment for 24 hours. o DN is best combined with other physical therapy such as strengthening, stretching, and other therapies.   TENS UNIT: This is helpful for muscle pain and spasm.   Search and Purchase a TENS  7000 2nd edition at www.tenspros.com. It should be less than $30.     TENS unit instructions: Do not shower or bathe with the unit on Turn the unit off before removing electrodes or batteries If the electrodes lose stickiness add a drop of water to the electrodes after they are disconnected from the unit and place on plastic sheet. If you continued to have difficulty, call the TENS unit company to purchase more electrodes. Do not apply lotion on the skin area prior to use. Make sure the skin is clean and dry as this will help prolong the life of the electrodes. After use, always check skin for unusual red areas, rash or other skin difficulties. If there are any skin problems, does not apply electrodes to the same area. Never remove the electrodes from the unit by pulling the wires. Do not use the TENS unit or electrodes other than as directed. Do not change electrode placement without consultating your therapist or physician. Keep 2 fingers with between each electrode. Wear time ratio is 2:1, on to off times.    For example on for 30 minutes off for 15 minutes and then on for 30 minutes off for 15 minutes      Flexibility: Neck Retraction    Pull head straight back, keeping eyes and jaw level. Repeat __5-10__ times per set. Do __1__ sets per session. Do __5-6__ sessions per day.  Strengthening: Shoulder Shrug (Phase 1)    Shrug shoulders  up and down, forward and backward. Repeat _5-10___ times per set. Do _1___ sets per session. Do _5-6___ sessions per day.  Scapular Retraction (Standing)    With arms at sides, pinch shoulder blades together. Repeat __5-10__ times per set. Do __1__ sets per session. Do __5-6__ sessions per day.  Flexibility: Upper Trapezius Stretch    Gently grasp right side of head while reaching behind back with other hand. Tilt head away until a gentle stretch is felt. Hold __20__ seconds. Then turn chin towards arm pit and hold another 20 sec.  Repeat __1__ times per set. Do _1___ sets per session. Do __2__ sessions per day.
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
## 2016-10-17 ENCOUNTER — Other Ambulatory Visit: Payer: Self-pay | Admitting: Physician Assistant

## 2016-10-17 DIAGNOSIS — E039 Hypothyroidism, unspecified: Secondary | ICD-10-CM

## 2016-11-14 ENCOUNTER — Encounter: Payer: BLUE CROSS/BLUE SHIELD | Admitting: Physical Therapy

## 2016-11-20 ENCOUNTER — Ambulatory Visit (INDEPENDENT_AMBULATORY_CARE_PROVIDER_SITE_OTHER): Payer: BLUE CROSS/BLUE SHIELD | Admitting: Rehabilitative and Restorative Service Providers"

## 2016-11-20 ENCOUNTER — Encounter: Payer: Self-pay | Admitting: Rehabilitative and Restorative Service Providers"

## 2016-11-20 DIAGNOSIS — R29898 Other symptoms and signs involving the musculoskeletal system: Secondary | ICD-10-CM

## 2016-11-20 DIAGNOSIS — M542 Cervicalgia: Secondary | ICD-10-CM | POA: Diagnosis not present

## 2016-11-20 NOTE — Patient Instructions (Signed)

## 2016-11-20 NOTE — Therapy (Signed)
[facility] [address] [place], [place], [address] Phone: [phone]   Fax:  [phone]  Physical Therapy Treatment  Patient Details  Name: Melissa Sharp MRN: [id number] Date of Birth: 10/01/1980 Referring Provider: Tandy Gawjade Breeback PA   Encounter Date: 11/20/2016      PT End of Session - 11/20/16 1439    Visit Number 2   Number of Visits 8   Date for PT Re-Evaluation 11/27/16   PT Start Time 1431   PT Stop Time 1530   PT Time Calculation (min) 59 min   Activity Tolerance Patient tolerated treatment well      Past Medical History:  Diagnosis Date  . Anxiety   . Depression   . Hypothyroidism   . Migraine     Past Surgical History:  Procedure Laterality Date  . TONSILECTOMY/ADENOIDECTOMY WITH MYRINGOTOMY      There were no vitals filed for this visit.      Subjective Assessment - 11/20/16 1440    Subjective Patient reports that she has trouble remembering and finding time for her exercises. Trying to do them when she can. Feels that DN really helps.    Currently in Pain? Yes   Pain Score 4    Pain Location Neck   Pain Orientation Left   Pain Descriptors / Indicators Dull   Pain Type Chronic pain   Pain Onset More than a month ago   Pain Frequency Constant            [facility] PT Assessment - 11/20/16 0001      Assessment   Medical Diagnosis cervical pain/spasms   Referring Provider Tandy Gawjade Breeback PA    Onset Date/Surgical Date 04/26/15   Hand Dominance Right   Next MD Visit PRN   Prior Therapy none     AROM   Cervical Flexion 18   Cervical Extension 26   Cervical - Right Side Bend 23   Cervical - Left Side Bend 30   Cervical - Right Rotation 45   Cervical - Left Rotation 55     Palpation   Spinal mobility hypomobile with pain CPA mobs T 3-6 and Lt UPA mobs C3-T6   Palpation comment tightness in bilat upper traps/levator, Lt peri occiput and ant/lat/post cervical musculature                       [facility] Adult PT Treatment/Exercise - 11/20/16 0001      Neck Exercises: Seated   Neck Retraction 5 reps     Neck Exercises: Supine   Neck Retraction 5 reps;10 secs     Moist Heat Therapy   Number Minutes Moist Heat 20 Minutes   Moist Heat Location Cervical     Electrical Stimulation   Electrical Stimulation Location cervical  Lt lateral cervical and upper trap to deltoid    Electrical Stimulation Action IFC   Electrical Stimulation Parameters to tolerance   Electrical Stimulation Goals Tone;Pain     Manual Therapy   Manual therapy comments pt supine    Joint Mobilization CPA mobs cervical spine Grade II/III   Soft tissue mobilization bilat ant/lat/post cervical musculature; upper traps; Lt deltoid    Myofascial Release cervical to upper traps bilat    Manual Traction cervical traction intirmittently through treatment           Trigger Point Dry Needling - 11/20/16 1551    Consent Given? Yes   Education Handout Provided Yes   Muscles  Treated Upper Body Upper trapezius;Sternocleidomastoid;Longissimus  Lt    Sternocleidomastoid Response Palpable increased muscle length   Upper Trapezius Response Palpable increased muscle length   Longissimus Response Palpable increased muscle length              PT Education - 11/20/16 1552    Education provided Yes   Education Details DN   Person(s) Educated Patient   Methods Explanation;Demonstration;Tactile cues;Verbal cues;Handout   Comprehension Verbalized understanding;Returned demonstration;Verbal cues required;Tactile cues required             PT Long Term Goals - 11/20/16 1441      PT LONG TERM GOAL #1   Title I with advanced HEP    Time 6   Period Weeks   Status On-going     PT LONG TERM GOAL #2   Title demo improved cervical rotation to WNL    Time 6   Period Weeks   Status On-going     PT LONG TERM GOAL #3   Title report =/> 75% improvement in pain to allow her to  sleep and wake without pain    Time 6   Period Weeks   Status On-going     PT LONG TERM GOAL #4   Title improve FOTO =/< 37% limited    Time 6   Period Weeks   Status On-going     PT LONG TERM GOAL #5   Title tolerate sitting per her previous level    Time 6   Period Weeks   Status On-going               Plan - 11/20/16 1553    Clinical Impression Statement Good response to DN with patient reporting that she felt much better. She had less palpable tightness through the cervical and upper trap musculature. Good improvement with treatment.    Rehab Potential Good   PT Frequency 2x / week   PT Duration 6 weeks   PT Treatment/Interventions Moist Heat;Traction;Ultrasound;Therapeutic exercise;Dry needling;Taping;Manual techniques;Neuromuscular re-education;Cryotherapy;Electrical Stimulation;Patient/family education;Passive range of motion   PT Next Visit Plan manual work, - STM, PROM cervical spine, DN, cervical mobs, modalities PRN   Consulted and Agree with Plan of Care Patient      Patient will benefit from skilled therapeutic intervention in order to improve the following deficits and impairments:  Pain, Increased muscle spasms, Hypomobility, Decreased strength, Decreased range of motion  Visit Diagnosis: Cervicalgia  Other symptoms and signs involving the musculoskeletal system     Problem List Patient Active Problem List   Diagnosis Date Noted  . Elevated blood pressure reading 05/03/2016  . Overweight (BMI 25.0-29.9) 03/20/2016  . Low back pain with radiation, unspecified laterality 02/23/2016  . Diastasis recti 02/20/2016  . PIH (pregnancy induced hypertension) 07/28/2015  . Migraine with status migrainosus 07/15/2015  . Intractable migraine 06/30/2015  . Cervical pain (neck) 06/26/2015  . Post concussive syndrome 05/21/2015  . Poor venous access 01/24/2015  . Elevated liver enzymes 11/02/2013  . Migraine without aura, without mention of intractable  migraine without mention of status migrainosus 10/29/2013  . Esophageal reflux 10/29/2013  . Depression, major, recurrent (HCC) 10/29/2013  . Mixed anxiety depressive disorder 10/29/2013  . B12 deficiency 10/29/2013  . Hypertriglyceridemia 10/29/2013  . IBS (irritable bowel syndrome) 10/27/2013  . Thyroid activity decreased 10/27/2013    Melissa Sharp PT, MPH  11/20/2016, 4:06 PM  [facility] [address] [place], [place], [address] Phone: [phone]  Fax:  [phone]  Name: Shrika Milos MRN: [id number] Date of Birth: 11/12/1980

## 2016-11-26 ENCOUNTER — Ambulatory Visit (INDEPENDENT_AMBULATORY_CARE_PROVIDER_SITE_OTHER): Payer: BLUE CROSS/BLUE SHIELD | Admitting: Physician Assistant

## 2016-11-26 ENCOUNTER — Encounter: Payer: Self-pay | Admitting: Physician Assistant

## 2016-11-26 VITALS — BP 121/69 | HR 106 | Temp 98.5°F | Ht 63.0 in | Wt 137.0 lb

## 2016-11-26 DIAGNOSIS — R748 Abnormal levels of other serum enzymes: Secondary | ICD-10-CM | POA: Diagnosis not present

## 2016-11-26 DIAGNOSIS — J40 Bronchitis, not specified as acute or chronic: Secondary | ICD-10-CM

## 2016-11-26 DIAGNOSIS — E781 Pure hyperglyceridemia: Secondary | ICD-10-CM

## 2016-11-26 DIAGNOSIS — J329 Chronic sinusitis, unspecified: Secondary | ICD-10-CM

## 2016-11-26 MED ORDER — METHYLPREDNISOLONE SODIUM SUCC 125 MG IJ SOLR
125.0000 mg | Freq: Once | INTRAMUSCULAR | Status: AC
  Administered 2016-11-26: 125 mg via INTRAMUSCULAR

## 2016-11-26 MED ORDER — GUAIFENESIN-CODEINE 200-10 MG/5ML PO LIQD: ORAL | 0 refills | Status: DC

## 2016-11-26 MED ORDER — ALBUTEROL SULFATE HFA 108 (90 BASE) MCG/ACT IN AERS: 2.0000 | INHALATION_SPRAY | Freq: Four times a day (QID) | RESPIRATORY_TRACT | 2 refills | Status: DC | PRN

## 2016-11-26 MED ORDER — AZITHROMYCIN 250 MG PO TABS: ORAL_TABLET | ORAL | 0 refills | Status: DC

## 2016-11-26 NOTE — Progress Notes (Signed)
Subjective:    Patient ID: Garner GavelKristin Chivers, female    DOB: 09/03/1980, 36 y.o.   MRN: [id number]  HPI  Pt is a 36 yo female who presents to the clinic with coughing, sinus pressure, headache, ear popping, fatigue for the last 7 days. Denies any ST, body aches, fever, chills. She is having some hot flashes. She denies any wheezing. Coughing is worse at bedtime. She is taking mucinex D with little relief. She does have hx of allergies to ragweed.   She wants labs checked for TG since being on fish oil. Insurance would not cover vascepa.   .. Active Ambulatory Problems    Diagnosis Date Noted  . IBS (irritable bowel syndrome) 10/27/2013  . Thyroid activity decreased 10/27/2013  . Migraine without aura, without mention of intractable migraine without mention of status migrainosus 10/29/2013  . Esophageal reflux 10/29/2013  . Depression, major, recurrent (HCC) 10/29/2013  . Mixed anxiety depressive disorder 10/29/2013  . B12 deficiency 10/29/2013  . Hypertriglyceridemia 10/29/2013  . Elevated liver enzymes 11/02/2013  . Poor venous access 01/24/2015  . Post concussive syndrome 05/21/2015  . Cervical pain (neck) 06/26/2015  . Intractable migraine 06/30/2015  . Migraine with status migrainosus 07/15/2015  . PIH (pregnancy induced hypertension) 07/28/2015  . Diastasis recti 02/20/2016  . Low back pain with radiation, unspecified laterality 02/23/2016  . Overweight (BMI 25.0-29.9) 03/20/2016  . Elevated blood pressure reading 05/03/2016   Resolved Ambulatory Problems    Diagnosis Date Noted  . Pregnancy 01/24/2015  . Viral gastroenteritis 01/24/2015  . Nausea and vomiting during pregnancy 03/07/2015  . Dizziness 03/07/2015  . Dehydration 03/07/2015  . Musculoskeletal neck pain 06/26/2015   Past Medical History:  Diagnosis Date  . Anxiety   . Depression   . Hypothyroidism   . Migraine       Review of Systems    see HPI.  Objective:   Physical Exam  Constitutional: She is  oriented to person, place, and time. She appears well-developed and well-nourished.  HENT:  Head: Normocephalic and atraumatic.  Right Ear: External ear normal.  Left Ear: External ear normal.  TM's clear bilaterally.  Tenderness over maxillary sinuses to palpation.  Bilateral nasal turbinates red and swollen.  Oropharynx erythematous with PND tonsils not swollen.   Eyes: Conjunctivae are normal. Right eye exhibits no discharge. Left eye exhibits no discharge.  Neck: Normal range of motion. Neck supple. No thyromegaly present.  Cardiovascular: Regular rhythm and normal heart sounds.   Tachycardia at 106.   Pulmonary/Chest: Effort normal and breath sounds normal. She has no wheezes.  Lymphadenopathy:    She has cervical adenopathy.  Neurological: She is alert and oriented to person, place, and time.  Skin:  Flushed cheeks.   Psychiatric: She has a normal mood and affect. Her behavior is normal.          Assessment & Plan:  Marland Kitchen.Marland Kitchen.[name] was seen today for cough, headache and otalgia.  Diagnoses and all orders for this visit:  Sinobronchitis -     azithromycin (ZITHROMAX) 250 MG tablet; Take 2 tablets now and then one tablet for 4 days. -     albuterol (PROVENTIL HFA;VENTOLIN HFA) 108 (90 Base) MCG/ACT inhaler; Inhale 2 puffs into the lungs every 6 (six) hours as needed for wheezing or shortness of breath. -     Guaifenesin-Codeine 200-10 MG/5ML LIQD; 5mL as needed for cough up to three times a day. -     methylPREDNISolone sodium succinate (SOLU-MEDROL) 125 mg/2 mL  injection 125 mg; Inject 2 mLs (125 mg total) into the muscle once.  Hypertriglyceridemia -     Lipid Panel w/reflex Direct LDL -     COMPLETE METABOLIC PANEL WITH GFR  Elevated liver enzymes -     COMPLETE METABOLIC PANEL WITH GFR   Discussed symptomatic care. Follow up as needed. Written out of work for today and tomorrow.   On fish oil will see how TG are going.

## 2016-11-27 ENCOUNTER — Encounter: Payer: BLUE CROSS/BLUE SHIELD | Admitting: Rehabilitative and Restorative Service Providers"

## 2016-11-29 ENCOUNTER — Encounter: Payer: BLUE CROSS/BLUE SHIELD | Admitting: Rehabilitative and Restorative Service Providers"

## 2016-12-04 ENCOUNTER — Ambulatory Visit (INDEPENDENT_AMBULATORY_CARE_PROVIDER_SITE_OTHER): Payer: BLUE CROSS/BLUE SHIELD | Admitting: Rehabilitative and Restorative Service Providers"

## 2016-12-04 ENCOUNTER — Encounter: Payer: Self-pay | Admitting: Rehabilitative and Restorative Service Providers"

## 2016-12-04 DIAGNOSIS — M542 Cervicalgia: Secondary | ICD-10-CM | POA: Diagnosis not present

## 2016-12-04 DIAGNOSIS — R29898 Other symptoms and signs involving the musculoskeletal system: Secondary | ICD-10-CM | POA: Diagnosis not present

## 2016-12-04 NOTE — Patient Instructions (Signed)
Scapula Adduction With Pectoralis Stretch: Low - Standing   Shoulders at 45 hands even with shoulders, keeping weight through legs, shift weight forward until you feel pull or stretch through the front of your chest. Hold _30__ seconds. Do _3__ times, _2-4__ times per day.   Scapula Adduction With Pectoralis Stretch: Mid-Range - Standing   Shoulders at 90 elbows even with shoulders, keeping weight through legs, shift weight forward until you feel pull or strength through the front of your chest. Hold __30_ seconds. Do _3__ times, __2-4_ times per day.   Scapula Adduction With Pectoralis Stretch: High - Standing   Shoulders at 120 hands up high on the doorway, keeping weight on feet, shift weight forward until you feel pull or stretch through the front of your chest. Hold _30__ seconds. Do _3__ times, _2-3__ times per day.   Shoulder Blade Squeeze    Rotate shoulders back, then squeeze shoulder blades down and back. Hold 10 sec Repeat __10__ times. Do _several___ sessions per day. Can use noodle to cue posture

## 2016-12-04 NOTE — Therapy (Signed)
[facility] [address] [place], [place], [address] Phone: [phone]   Fax:  [phone]  Physical Therapy Treatment  Patient Details  Name: Melissa Sharp MRN: [id number] Date of Birth: Mar 03, 1981 Referring Provider: Tandy Gaw, PA-C  Encounter Date: 12/04/2016      PT End of Session - 12/04/16 1520    Visit Number 3   Number of Visits 15   Date for PT Re-Evaluation 01/15/17   PT Start Time 1517   PT Stop Time 1615   PT Time Calculation (min) 58 min   Activity Tolerance Patient tolerated treatment well      Past Medical History:  Diagnosis Date  . Anxiety   . Depression   . Hypothyroidism   . Migraine     Past Surgical History:  Procedure Laterality Date  . TONSILECTOMY/ADENOIDECTOMY WITH MYRINGOTOMY      There were no vitals filed for this visit.      Subjective Assessment - 12/04/16 1525    Subjective Patient some improvement since beginning therapy. She continues to have some tightness and discomfort in the Lt neck. She felt the DN was helpful. No more daily headaches.   Currently in Pain? Yes   Pain Score 3    Pain Location Neck   Pain Orientation Left   Pain Descriptors / Indicators Dull;Aching   Pain Type Chronic pain   Pain Radiating Towards base of the neck to shoulder blade    Pain Onset More than a month ago   Pain Frequency Intermittent            [facility] PT Assessment - 12/04/16 0001      Assessment   Medical Diagnosis cervical pain/spasms   Referring Provider Tandy Gaw, PA-C   Onset Date/Surgical Date 04/26/15   Hand Dominance Right   Next MD Visit PRN   Prior Therapy none     Observation/Other Assessments   Focus on Therapeutic Outcomes (FOTO)  39% limitation      AROM   Cervical Flexion 66   Cervical Extension 50   Cervical - Right Side Bend 43   Cervical - Left Side Bend 34   Cervical - Right Rotation 72   Cervical - Left Rotation 67     Palpation   Spinal mobility hypomobile with pain CPA mobs T 3-6 and Lt UPA mobs C3-T6   Palpation comment tightness in bilat upper traps/levator, Lt peri occiput and ant/lat/post cervical musculature                      [facility] Adult PT Treatment/Exercise - 12/04/16 0001      Neck Exercises: Seated   Neck Retraction 5 reps     Neck Exercises: Supine   Neck Retraction 5 reps;10 secs     Moist Heat Therapy   Number Minutes Moist Heat 20 Minutes   Moist Heat Location Cervical     Electrical Stimulation   Electrical Stimulation Location cervical  Lt lateral cervical and upper trap to deltoid    Electrical Stimulation Action IFC   Electrical Stimulation Parameters to tolerance   Electrical Stimulation Goals Tone;Pain     Manual Therapy   Manual therapy comments pt supine    Joint Mobilization CPA mobs cervical spine Grade II/III   Soft tissue mobilization bilat ant/lat/post cervical musculature; upper traps; Lt deltoid    Myofascial Release cervical to upper traps bilat    Manual Traction cervical traction intirmittently through treatment  Neck Exercises: Stretches   Upper Trapezius Stretch 20 seconds   Levator Stretch 20 seconds   Other Neck Stretches 3 way doorway stretch  30 sec x 2 each           Trigger Point Dry Needling - 12/04/16 1600    Consent Given? Yes   Muscles Treated Upper Body Upper trapezius;Suboccipitals muscle group;Oblique capitus;Longissimus  Lt    Upper Trapezius Response Palpable increased muscle length   Oblique Capitus Response Palpable increased muscle length   SubOccipitals Response Palpable increased muscle length   Longissimus Response Palpable increased muscle length              PT Education - 12/04/16 1541    Education provided Yes   Education Details HEP   Person(s) Educated Patient   Methods Explanation;Demonstration;Tactile cues;Verbal cues;Handout   Comprehension Verbalized understanding;Returned demonstration;Verbal cues  required;Tactile cues required             PT Long Term Goals - 12/04/16 1523      PT LONG TERM GOAL #1   Title I with advanced HEP    Time 12   Period Weeks   Status Revised   Target Date 01/15/17     PT LONG TERM GOAL #2   Title demo improved cervical rotation to WNL    Time 12   Period Weeks   Status Revised   Target Date 01/15/17     PT LONG TERM GOAL #3   Title report =/> 75% improvement in pain to allow her to sleep and wake without pain    Time 12   Period Weeks   Status Revised   Target Date 01/15/17     PT LONG TERM GOAL #4   Title improve FOTO =/< 37% limited    Time 12   Period Weeks   Status Revised   Target Date 01/15/17     PT LONG TERM GOAL #5   Title tolerate sitting per her previous level    Time 12   Period Weeks   Status Revised   Target Date 01/16/27               Plan - 12/04/16 1600    Clinical Impression Statement Improvement noted in cervical mobility and palpable tightness. FOTO score indicates decreased impairment functionally. Patient is progressing well toward stated goals of therapy and will benefit from continued treatment to achieve maximum therapeutic level.    Rehab Potential Good   PT Frequency 2x / week   PT Duration 6 weeks   PT Treatment/Interventions Moist Heat;Traction;Ultrasound;Therapeutic exercise;Dry needling;Taping;Manual techniques;Neuromuscular re-education;Cryotherapy;Electrical Stimulation;Patient/family education;Passive range of motion   PT Next Visit Plan manual work, - STM, PROM cervical spine, DN, cervical mobs, modalities PRN - review HEP  add posterior shoudler girdle strengthening    Consulted and Agree with Plan of Care Patient      Patient will benefit from skilled therapeutic intervention in order to improve the following deficits and impairments:  Pain, Increased muscle spasms, Hypomobility, Decreased strength, Decreased range of motion  Visit Diagnosis: Cervicalgia  Other symptoms and  signs involving the musculoskeletal system     Problem List Patient Active Problem List   Diagnosis Date Noted  . Elevated blood pressure reading 05/03/2016  . Overweight (BMI 25.0-29.9) 03/20/2016  . Low back pain with radiation, unspecified laterality 02/23/2016  . Diastasis recti 02/20/2016  . PIH (pregnancy induced hypertension) 07/28/2015  . Migraine with status migrainosus 07/15/2015  . Intractable migraine 06/30/2015  .  Cervical pain (neck) 06/26/2015  . Post concussive syndrome 05/21/2015  . Poor venous access 01/24/2015  . Elevated liver enzymes 11/02/2013  . Migraine without aura, without mention of intractable migraine without mention of status migrainosus 10/29/2013  . Esophageal reflux 10/29/2013  . Depression, major, recurrent (HCC) 10/29/2013  . Mixed anxiety depressive disorder 10/29/2013  . B12 deficiency 10/29/2013  . Hypertriglyceridemia 10/29/2013  . IBS (irritable bowel syndrome) 10/27/2013  . Thyroid activity decreased 10/27/2013    Melissa Sharp Rober Minion PT, MPH  12/04/2016, 4:04 PM  [facility] [address] [place], [place], [address] Phone: [phone]   Fax:  [phone]  Name: Ginny Loomer MRN: [id number] Date of Birth: 1980-12-05

## 2016-12-06 ENCOUNTER — Encounter: Payer: BLUE CROSS/BLUE SHIELD | Admitting: Rehabilitative and Restorative Service Providers"

## 2016-12-11 ENCOUNTER — Encounter: Payer: BLUE CROSS/BLUE SHIELD | Admitting: Rehabilitative and Restorative Service Providers"

## 2016-12-13 ENCOUNTER — Encounter: Payer: BLUE CROSS/BLUE SHIELD | Admitting: Rehabilitative and Restorative Service Providers"

## 2016-12-16 DIAGNOSIS — Z6823 Body mass index (BMI) 23.0-23.9, adult: Secondary | ICD-10-CM | POA: Diagnosis not present

## 2016-12-16 DIAGNOSIS — N643 Galactorrhea not associated with childbirth: Secondary | ICD-10-CM | POA: Diagnosis not present

## 2016-12-16 DIAGNOSIS — O926 Galactorrhea: Secondary | ICD-10-CM | POA: Diagnosis not present

## 2016-12-16 DIAGNOSIS — Z01419 Encounter for gynecological examination (general) (routine) without abnormal findings: Secondary | ICD-10-CM | POA: Diagnosis not present

## 2016-12-20 ENCOUNTER — Encounter: Payer: BLUE CROSS/BLUE SHIELD | Admitting: Rehabilitative and Restorative Service Providers"

## 2016-12-24 ENCOUNTER — Telehealth: Payer: Self-pay | Admitting: Physician Assistant

## 2016-12-24 NOTE — Telephone Encounter (Signed)
Usually we recheck levels in 4-6 months after starting a new medication.

## 2016-12-24 NOTE — Telephone Encounter (Signed)
LMOM notifying pt. 

## 2016-12-24 NOTE — Telephone Encounter (Signed)
Patient called adv that she has called past several weeks left vm for Amber requesting to know if she soul still get labs done and she has not been taken her fish oil pills like she should. How long should she take the fish oil tablets before getting labs. Please adv and call pt back.

## 2016-12-24 NOTE — Telephone Encounter (Signed)
AMBER/ [name] PLEASE SEE NOTE BELOW. Melissa Sharp,CMA

## 2016-12-27 ENCOUNTER — Encounter: Payer: Self-pay | Admitting: Rehabilitative and Restorative Service Providers"

## 2016-12-27 ENCOUNTER — Ambulatory Visit (INDEPENDENT_AMBULATORY_CARE_PROVIDER_SITE_OTHER): Payer: BLUE CROSS/BLUE SHIELD | Admitting: Rehabilitative and Restorative Service Providers"

## 2016-12-27 DIAGNOSIS — R29898 Other symptoms and signs involving the musculoskeletal system: Secondary | ICD-10-CM

## 2016-12-27 DIAGNOSIS — M542 Cervicalgia: Secondary | ICD-10-CM

## 2016-12-27 NOTE — Therapy (Signed)
[facility] [place] [address] [place], [place], [address] Phone: [phone]   Fax:  [phone]  Physical Therapy Treatment  Patient Details  Name: Melissa Sharp MRN: [id number] Date of Birth: 1981/02/21 Referring Provider: Tandy Gaw, PA-C  Encounter Date: 12/27/2016      PT End of Session - 12/27/16 1408    Visit Number 4   Number of Visits 15   Date for PT Re-Evaluation 01/15/17   PT Start Time 1407   PT Stop Time 1503   PT Time Calculation (min) 56 min   Activity Tolerance Patient tolerated treatment well      Past Medical History:  Diagnosis Date  . Anxiety   . Depression   . Hypothyroidism   . Migraine     Past Surgical History:  Procedure Laterality Date  . TONSILECTOMY/ADENOIDECTOMY WITH MYRINGOTOMY      There were no vitals filed for this visit.      Subjective Assessment - 12/27/16 1417    Subjective PT is helpful and she wants to continue therapy. She has had problems with attending PT related to a death in the family, sick child and just found out that she lost her job. She has been dealing with a lot of extra stress and this has made consistent exercise difficult.    Currently in Pain? Yes   Pain Score 6    Pain Location Neck   Pain Orientation Left   Pain Descriptors / Indicators Dull;Aching   Pain Type Chronic pain   Pain Onset More than a month ago   Pain Frequency Intermittent            [facility] PT Assessment - 12/27/16 0001      Assessment   Medical Diagnosis cervical pain/spasms   Referring Provider Tandy Gaw, PA-C   Onset Date/Surgical Date 04/26/15   Hand Dominance Right   Next MD Visit PRN   Prior Therapy none     AROM   Cervical Flexion 57   Cervical Extension 45   Cervical - Right Side Bend 45   Cervical - Left Side Bend 43   Cervical - Right Rotation 73   Cervical - Left Rotation 72     Palpation   Spinal mobility hypomobile with pain CPA mobs T 3-6 and Lt  UPA mobs C3-T6   Palpation comment tightness in bilat upper traps/levator, Lt peri occiput and ant/lat/post cervical musculature                      [facility] Adult PT Treatment/Exercise - 12/27/16 0001      Neck Exercises: Standing   Other Standing Exercises scap squeeze 10 sec x 5   Other Standing Exercises L's and W's x 10 each      Moist Heat Therapy   Number Minutes Moist Heat 20 Minutes   Moist Heat Location Cervical     Electrical Stimulation   Electrical Stimulation Location cervical  Lt lateral cervical and upper trap to deltoid    Electrical Stimulation Action IFC   Electrical Stimulation Parameters to tolerance   Electrical Stimulation Goals Tone;Pain     Manual Therapy   Manual therapy comments pt prone    Joint Mobilization CPA mobs cervical spine Grade II/III   Soft tissue mobilization bilat post cervical musculature; upper traps   Myofascial Release cervical to upper traps bilat      Neck Exercises: Stretches   Upper Trapezius Stretch 20 seconds;3 reps  Other Neck Stretches 3 way doorway stretch  30 sec x 2 each           Trigger Point Dry Needling - 12/27/16 1434    Consent Given? Yes   Muscles Treated Upper Body --  bilat with estim    Upper Trapezius Response Palpable increased muscle length   Oblique Capitus Response Palpable increased muscle length   SubOccipitals Response Palpable increased muscle length   Longissimus Response Palpable increased muscle length                   PT Long Term Goals - 12/27/16 1413      PT LONG TERM GOAL #1   Title I with advanced HEP    Time 12   Period Weeks   Status On-going     PT LONG TERM GOAL #2   Title demo improved cervical rotation to WNL    Time 12   Period Weeks   Status On-going     PT LONG TERM GOAL #3   Title report =/> 75% improvement in pain to allow her to sleep and wake without pain    Time 12   Period Weeks   Status On-going     PT LONG TERM GOAL #4   Title  improve FOTO =/< 37% limited    Time 12   Period Weeks   Status On-going     PT LONG TERM GOAL #5   Title tolerate sitting per her previous level    Time 12   Period Weeks   Status On-going               Plan - 12/27/16 1414    Clinical Impression Statement Some improvement in cervical ROM. Patient has continued muscular tightness through the cervical and shoulder areas with continued discomfort and pain. Braylea responded well to DN with estim and manual work. She has had a lot of stress in her familiy in the past few weeks including a death in the family; children who are sick and she just discovered that she lost her job. Patient will benefit from continued PT on a consistent basis to resolve symptoms.    Rehab Potential Good   PT Frequency 2x / week   PT Duration 6 weeks   PT Treatment/Interventions Moist Heat;Traction;Ultrasound;Therapeutic exercise;Dry needling;Taping;Manual techniques;Neuromuscular re-education;Cryotherapy;Electrical Stimulation;Patient/family education;Passive range of motion   PT Next Visit Plan manual work, - STM, PROM cervical spine, DN, cervical mobs, modalities PRN - review HEP  add posterior shoudler girdle strengthening as tolerated  - needs to attend PT consistently    Consulted and Agree with Plan of Care Patient      Patient will benefit from skilled therapeutic intervention in order to improve the following deficits and impairments:  Pain, Increased muscle spasms, Hypomobility, Decreased strength, Decreased range of motion  Visit Diagnosis: Cervicalgia  Other symptoms and signs involving the musculoskeletal system     Problem List Patient Active Problem List   Diagnosis Date Noted  . Elevated blood pressure reading 05/03/2016  . Overweight (BMI 25.0-29.9) 03/20/2016  . Low back pain with radiation, unspecified laterality 02/23/2016  . Diastasis recti 02/20/2016  . PIH (pregnancy induced hypertension) 07/28/2015  . Migraine with  status migrainosus 07/15/2015  . Intractable migraine 06/30/2015  . Cervical pain (neck) 06/26/2015  . Post concussive syndrome 05/21/2015  . Poor venous access 01/24/2015  . Elevated liver enzymes 11/02/2013  . Migraine without aura, without mention of intractable migraine without mention of status  migrainosus 10/29/2013  . Esophageal reflux 10/29/2013  . Depression, major, recurrent (HCC) 10/29/2013  . Mixed anxiety depressive disorder 10/29/2013  . B12 deficiency 10/29/2013  . Hypertriglyceridemia 10/29/2013  . IBS (irritable bowel syndrome) 10/27/2013  . Thyroid activity decreased 10/27/2013    Aiken Withem Rober Minion PT, MPH  12/27/2016, 2:45 PM  [facility] [address] [place], [place], [address] Phone: [phone]   Fax:  [phone]  Name: Larsen Zettel MRN: [id number] Date of Birth: May 24, 1980

## 2016-12-31 ENCOUNTER — Encounter: Payer: BLUE CROSS/BLUE SHIELD | Admitting: Physical Therapy

## 2017-01-01 ENCOUNTER — Encounter: Payer: Self-pay | Admitting: Physician Assistant

## 2017-01-07 ENCOUNTER — Ambulatory Visit (INDEPENDENT_AMBULATORY_CARE_PROVIDER_SITE_OTHER): Payer: BLUE CROSS/BLUE SHIELD | Admitting: Rehabilitative and Restorative Service Providers"

## 2017-01-07 ENCOUNTER — Encounter: Payer: Self-pay | Admitting: Rehabilitative and Restorative Service Providers"

## 2017-01-07 DIAGNOSIS — R29898 Other symptoms and signs involving the musculoskeletal system: Secondary | ICD-10-CM | POA: Diagnosis not present

## 2017-01-07 DIAGNOSIS — M542 Cervicalgia: Secondary | ICD-10-CM | POA: Diagnosis not present

## 2017-01-07 NOTE — Patient Instructions (Signed)
Resisted External Rotation: in Neutral - Bilateral   PALMS UP Sit or stand, tubing in both hands, elbows at sides, bent to 90, forearms forward. Pinch shoulder blades together and rotate forearms out. Keep elbows at sides. Repeat __10__ times per set. Do _2-3___ sets per session. Do _2-3___ sessions per day.   Low Row: Standing   Face anchor, feet shoulder width apart. Palms up, pull arms back, squeezing shoulder blades together. Repeat 10__ times per set. Do 2-3__ sets per session. Do 2-3__ sessions per day. Anchor Height: Waist     Strengthening: Resisted Extension   Hold tubing in right hand, arm forward. Pull arm back, elbow straight. Repeat _10___ times per set. Do 2-3____ sets per session. Do 2-3____ sessions per day.   

## 2017-01-07 NOTE — Therapy (Signed)
[facility] [place] [address] [place], [place], [address] Phone: [phone]   Fax:  [phone]  Physical Therapy Treatment  Patient Details  Name: Melissa Sharp MRN: [id number] Date of Birth: 01-04-1981 Referring Provider: Tandy Gaw, PA-C  Encounter Date: 01/07/2017      PT End of Session - 01/07/17 1020    Visit Number 5   Number of Visits 15   Date for PT Re-Evaluation 01/15/17   PT Start Time 1016   PT Stop Time 1115   PT Time Calculation (min) 59 min   Activity Tolerance Patient tolerated treatment well      Past Medical History:  Diagnosis Date  . Anxiety   . Depression   . Hypothyroidism   . Migraine     Past Surgical History:  Procedure Laterality Date  . TONSILECTOMY/ADENOIDECTOMY WITH MYRINGOTOMY      There were no vitals filed for this visit.      Subjective Assessment - 01/07/17 1024    Subjective patient reports that she is feeling better. She has increased motion. She is no longer awakening with headaches. She feels the DN with electrodes really helped. Much better overall.     Currently in Pain? Yes   Pain Score 1    Pain Location Neck   Pain Orientation Left   Pain Descriptors / Indicators Dull;Aching   Pain Type Chronic pain   Pain Onset More than a month ago   Pain Frequency Intermittent   Aggravating Factors  minimal irritation    Pain Relieving Factors DN and exercise             [facility] PT Assessment - 01/07/17 0001      Assessment   Medical Diagnosis cervical pain/spasms   Referring Provider Tandy Gaw, PA-C   Onset Date/Surgical Date 04/26/15   Hand Dominance Right   Next MD Visit PRN   Prior Therapy none     AROM   Cervical Flexion 67   Cervical Extension 57   Cervical - Right Side Bend 47   Cervical - Left Side Bend 45   Cervical - Right Rotation 80   Cervical - Left Rotation 75     Palpation   Spinal mobility hypomobile CPA mobs T 3-6 and Lt UPA mobs  C3-T6   Palpation comment tightness in bilat upper traps/levator Lt > Rt, Lt peri occiput and ant/lat/post cervical musculature                      [facility] Adult PT Treatment/Exercise - 01/07/17 0001      Neck Exercises: Machines for Strengthening   UBE (Upper Arm Bike) L2 x 4 min alt fwd/back 1 min      Neck Exercises: Theraband   Scapula Retraction 10 reps  yellow - L's   Rows 20 reps;Red   Other Theraband Exercises shoulder extension x 20 red TB      Neck Exercises: Standing   Neck Retraction 5 reps   Other Standing Exercises scap squeeze 10 sec x 5   Other Standing Exercises L's and W's x 10 each      Moist Heat Therapy   Number Minutes Moist Heat 20 Minutes   Moist Heat Location Cervical     Electrical Stimulation   Electrical Stimulation Location cervical  Lt lateral cervical and upper trap to deltoid    Electrical Stimulation Action IFC   Electrical Stimulation Parameters to tolerance   Electrical Stimulation Goals Tone;Pain  Manual Therapy   Manual therapy comments pt prone    Joint Mobilization CPA mobs cervical spine Grade II/III   Soft tissue mobilization bilat post cervical musculature; upper traps   Myofascial Release cervical to upper traps bilat      Neck Exercises: Stretches   Upper Trapezius Stretch 20 seconds;3 reps   Other Neck Stretches 3 way doorway stretch  30 sec x 2 each           Trigger Point Dry Needling - 01/07/17 1053    Consent Given? Yes   Muscles Treated Upper Body --  Lt - estim   Longissimus Response Palpable increased muscle length  cervical and thoracic               PT Education - 01/07/17 1040    Education provided Yes   Education Details HEP    Person(s) Educated Patient   Methods Explanation;Demonstration;Tactile cues;Verbal cues;Handout   Comprehension Verbalized understanding;Returned demonstration;Verbal cues required;Tactile cues required             PT Long Term Goals - 01/07/17 1021       PT LONG TERM GOAL #1   Title I with advanced HEP    Time 12   Period Weeks   Status On-going     PT LONG TERM GOAL #2   Title demo improved cervical rotation to WNL    Time 12   Period Weeks   Status On-going     PT LONG TERM GOAL #3   Title report =/> 75% improvement in pain to allow her to sleep and wake without pain    Time 12   Period Weeks   Status On-going     PT LONG TERM GOAL #4   Title improve FOTO =/< 37% limited    Time 12   Period Weeks   Status On-going     PT LONG TERM GOAL #5   Title tolerate sitting per her previous level    Time 12   Period Weeks   Status On-going               Plan - 01/07/17 1027    Clinical Impression Statement Excellent progress with decreased pain and improved mobility and function. Patient is progressing well toward stated goals of therapy.    Rehab Potential Good   PT Frequency 2x / week   PT Duration 6 weeks   PT Treatment/Interventions Moist Heat;Traction;Ultrasound;Therapeutic exercise;Dry needling;Taping;Manual techniques;Neuromuscular re-education;Cryotherapy;Electrical Stimulation;Patient/family education;Passive range of motion   PT Next Visit Plan manual work, - STM, PROM cervical spine, DN, cervical mobs, modalities PRN - review HEP  add posterior shoudler girdle strengthening as tolerated  - will continue Pt for a few visits to be sure pt does not experience any flare up of symptoms    Consulted and Agree with Plan of Care Patient      Patient will benefit from skilled therapeutic intervention in order to improve the following deficits and impairments:  Pain, Increased muscle spasms, Hypomobility, Decreased strength, Decreased range of motion  Visit Diagnosis: Cervicalgia  Other symptoms and signs involving the musculoskeletal system     Problem List Patient Active Problem List   Diagnosis Date Noted  . Elevated blood pressure reading 05/03/2016  . Overweight (BMI 25.0-29.9) 03/20/2016  . Low  back pain with radiation, unspecified laterality 02/23/2016  . Diastasis recti 02/20/2016  . PIH (pregnancy induced hypertension) 07/28/2015  . Migraine with status migrainosus 07/15/2015  . Intractable migraine 06/30/2015  .  Cervical pain (neck) 06/26/2015  . Post concussive syndrome 05/21/2015  . Poor venous access 01/24/2015  . Elevated liver enzymes 11/02/2013  . Migraine without aura, without mention of intractable migraine without mention of status migrainosus 10/29/2013  . Esophageal reflux 10/29/2013  . Depression, major, recurrent (HCC) 10/29/2013  . Mixed anxiety depressive disorder 10/29/2013  . B12 deficiency 10/29/2013  . Hypertriglyceridemia 10/29/2013  . IBS (irritable bowel syndrome) 10/27/2013  . Thyroid activity decreased 10/27/2013    Yonna Alwin Rober Minion PT, MPH  01/07/2017, 10:55 AM  [facility] [address] [place], [place], [address] Phone: [phone]   Fax:  [phone]  Name: Dede Dobesh MRN: [id number] Date of Birth: 06-24-1980

## 2017-01-10 ENCOUNTER — Encounter: Payer: BLUE CROSS/BLUE SHIELD | Admitting: Rehabilitative and Restorative Service Providers"

## 2017-01-14 ENCOUNTER — Ambulatory Visit (INDEPENDENT_AMBULATORY_CARE_PROVIDER_SITE_OTHER): Payer: BLUE CROSS/BLUE SHIELD | Admitting: Rehabilitative and Restorative Service Providers"

## 2017-01-14 ENCOUNTER — Encounter: Payer: Self-pay | Admitting: Rehabilitative and Restorative Service Providers"

## 2017-01-14 DIAGNOSIS — M542 Cervicalgia: Secondary | ICD-10-CM

## 2017-01-14 DIAGNOSIS — R29898 Other symptoms and signs involving the musculoskeletal system: Secondary | ICD-10-CM | POA: Diagnosis not present

## 2017-01-14 NOTE — Therapy (Signed)
[place], [place], [address] Phone: [phone]   Fax:  [phone]  Physical Therapy Treatment  Patient Details  Name: Melissa Sharp MRN: [id number] Date of Birth: [birth year] Referring Provider: Juliann Pares, PA-C  Encounter Date: 01/14/2017      PT End of Session - 01/14/17 1031    Visit Number 6   Number of Visits 15   Date for PT Re-Evaluation 01/15/17   PT Start Time 1022   PT Stop Time 1115   PT Time Calculation (min) 53 min   Activity Tolerance Patient tolerated treatment well      Past Medical History:  Diagnosis Date  . Anxiety   . Depression   . Hypothyroidism   . Migraine     Past Surgical History:  Procedure Laterality Date  . TONSILECTOMY/ADENOIDECTOMY WITH MYRINGOTOMY      There were no vitals filed for this visit.      Subjective Assessment - 01/14/17 1032    Subjective Patient reports that she is pain free. She is ding her exercises at home and they are going well.    Currently in Pain? No/denies            [facility] PT Assessment - 01/14/17 0001      Assessment   Medical Diagnosis cervical pain/spasms   Referring Provider Juliann Pares, PA-C   Onset Date/Surgical Date 04/26/15   Hand Dominance Right   Next MD Visit PRN   Prior Therapy none     Observation/Other Assessments   Focus on Therapeutic Outcomes (FOTO)  12% limitation      AROM   Cervical Flexion 59   Cervical Extension 69   Cervical - Right Side Bend 46   Cervical - Left Side Bend 45   Cervical - Right Rotation 82   Cervical - Left Rotation 80     Strength   Right/Left Shoulder --  5/5 bilat UE's no pain    Right/Left Elbow --  WFL's bilat      Palpation   Spinal mobility WFL's    Palpation comment mild tightness in bilat upper traps/levator                      [facility] Adult PT Treatment/Exercise - 01/14/17 0001      Neck Exercises: Machines for Strengthening   UBE  (Upper Arm Bike) L3 x 4 min alt fwd/back 1 min      Neck Exercises: Theraband   Scapula Retraction 10 reps  yellow - L's   Rows 20 reps;Red   Other Theraband Exercises shoulder extension x 20 red TB      Neck Exercises: Standing   Neck Retraction 5 reps   Other Standing Exercises scap squeeze 10 sec x 5   Other Standing Exercises L's and W's x 10 each      Electrical Stimulation   Electrical Stimulation Location cervical  Lt lateral cervical and upper trap to deltoid    Electrical Stimulation Action IFC   Electrical Stimulation Parameters to tolerance   Electrical Stimulation Goals Tone;Pain     Manual Therapy   Manual therapy comments pt supine    Joint Mobilization CPA mobs cervical spine Grade II/III   Soft tissue mobilization bilat post cervical musculature; upper traps   Myofascial Release cervical to upper traps bilat      Neck Exercises: Stretches   Upper Trapezius Stretch 20 seconds;3 reps   Other Neck Stretches 3 way  doorway stretch  30 sec x 2 each                      PT Long Term Goals - 01/14/17 1034      PT LONG TERM GOAL #1   Title I with advanced HEP    Time 12   Period Weeks   Status Achieved     PT LONG TERM GOAL #2   Title demo improved cervical rotation to WNL    Time 12   Period Weeks   Status Achieved     PT LONG TERM GOAL #3   Title report =/> 75% improvement in pain to allow her to sleep and wake without pain    Time 12   Period Weeks   Status On-going     PT LONG TERM GOAL #4   Title improve FOTO =/< 37% limited    Time 12   Period Weeks   Status Achieved     PT LONG TERM GOAL #5   Title tolerate sitting per her previous level    Time 12   Period Weeks   Status Achieved               Plan - 01/14/17 1058    Clinical Impression Statement Excellent progress. Patient reports having time to do her execises and stretching and can tell this has helped. She is pain free and has returned to normal functional  activities. Goals of therapy have been accomplished. Patient will be discharged to independent HEP. She will call with any questions or concerns.    Rehab Potential Good   PT Frequency 2x / week   PT Duration 6 weeks   PT Treatment/Interventions Moist Heat;Traction;Ultrasound;Therapeutic exercise;Dry needling;Taping;Manual techniques;Neuromuscular re-education;Cryotherapy;Electrical Stimulation;Patient/family education;Passive range of motion   PT Next Visit Plan Progress to green TB for posterior shoudler girdle strengthening. Issued TB today. Patient will continue with independent HEP and call with any questions or problems.    Consulted and Agree with Plan of Care Patient      Patient will benefit from skilled therapeutic intervention in order to improve the following deficits and impairments:  Pain, Increased muscle spasms, Hypomobility, Decreased strength, Decreased range of motion  Visit Diagnosis: Cervicalgia  Other symptoms and signs involving the musculoskeletal system     Problem List Patient Active Problem List   Diagnosis Date Noted  . Elevated blood pressure reading 05/03/2016  . Overweight (BMI 25.0-29.9) 03/20/2016  . Low back pain with radiation, unspecified laterality 02/23/2016  . Diastasis recti 02/20/2016  . PIH (pregnancy induced hypertension) 07/28/2015  . Migraine with status migrainosus 07/15/2015  . Intractable migraine 06/30/2015  . Cervical pain (neck) 06/26/2015  . Post concussive syndrome 05/21/2015  . Poor venous access 01/24/2015  . Elevated liver enzymes 11/02/2013  . Migraine without aura, without mention of intractable migraine without mention of status migrainosus 10/29/2013  . Esophageal reflux 10/29/2013  . Depression, major, recurrent ([place]) 10/29/2013  . Mixed anxiety depressive disorder 10/29/2013  . B12 deficiency 10/29/2013  . Hypertriglyceridemia 10/29/2013  . IBS (irritable bowel syndrome) 10/27/2013  . Thyroid activity decreased  10/27/2013    [name] PT, MPH  01/14/2017, 11:01 AM  [facility] [place], [place], [address] Phone: [phone]   Fax:  [phone]  Name: Melissa Sharp MRN: [id number] Date of Birth: [birth year]  PHYSICAL THERAPY DISCHARGE SUMMARY  Visits from Start of Care: 6  Current functional level related  to goals / functional outcomes: See progress note.   Remaining deficits: Needs to continue with HEP    Education / Equipment: HEP Plan: Patient agrees to discharge.  Patient goals were met. Patient is being discharged due to meeting the stated rehab goals.  ?????    Celyn P. Helene Kelp PT, MPH 01/14/17 11:02 AM

## 2017-01-17 ENCOUNTER — Encounter: Payer: BLUE CROSS/BLUE SHIELD | Admitting: Rehabilitative and Restorative Service Providers"

## 2017-01-30 ENCOUNTER — Ambulatory Visit (INDEPENDENT_AMBULATORY_CARE_PROVIDER_SITE_OTHER): Payer: BLUE CROSS/BLUE SHIELD | Admitting: Osteopathic Medicine

## 2017-01-30 ENCOUNTER — Encounter: Payer: Self-pay | Admitting: Osteopathic Medicine

## 2017-01-30 VITALS — BP 143/96 | HR 83 | Temp 98.1°F | Wt 140.0 lb

## 2017-01-30 DIAGNOSIS — B719 Cestode infection, unspecified: Secondary | ICD-10-CM | POA: Diagnosis not present

## 2017-01-30 DIAGNOSIS — Z0189 Encounter for other specified special examinations: Secondary | ICD-10-CM | POA: Diagnosis not present

## 2017-01-30 LAB — POCT URINE PREGNANCY: PREG TEST UR: NEGATIVE

## 2017-01-30 MED ORDER — PRAZIQUANTEL 600 MG PO TABS: 600.0000 mg | ORAL_TABLET | Freq: Once | ORAL | 0 refills | Status: AC

## 2017-01-30 NOTE — Progress Notes (Signed)
HPI: Melissa GavelKristin Sharp is a 36 y.o. female who  has a past medical history of Anxiety, Depression, Hypothyroidism, and Migraine.  she presents to [facility] today, 01/30/17,  for chief complaint of:  Chief Complaint  Patient presents with  . Other    possible tapeworm    Diarrhea, abdominal pain for about 2 weeks or so. No nausea/vomiting. No fever/chills. 8 [place] a [employer] a while before having these symptoms. Yesterday noticed what looked like a tapeworm in the toilet with loose stool. She brings a photo to the office today, looks pretty much like a tape worm to me.   Past medical, surgical, social and family history reviewed:  Patient Active Problem List   Diagnosis Date Noted  . Elevated blood pressure reading 05/03/2016  . Overweight (BMI 25.0-29.9) 03/20/2016  . Low back pain with radiation, unspecified laterality 02/23/2016  . Diastasis recti 02/20/2016  . PIH (pregnancy induced hypertension) 07/28/2015  . Migraine with status migrainosus 07/15/2015  . Intractable migraine 06/30/2015  . Cervical pain (neck) 06/26/2015  . Post concussive syndrome 05/21/2015  . Poor venous access 01/24/2015  . Elevated liver enzymes 11/02/2013  . Migraine without aura, without mention of intractable migraine without mention of status migrainosus 10/29/2013  . Esophageal reflux 10/29/2013  . Depression, major, recurrent (HCC) 10/29/2013  . Mixed anxiety depressive disorder 10/29/2013  . B12 deficiency 10/29/2013  . Hypertriglyceridemia 10/29/2013  . IBS (irritable bowel syndrome) 10/27/2013  . Thyroid activity decreased 10/27/2013    Past Surgical History:  Procedure Laterality Date  . TONSILECTOMY/ADENOIDECTOMY WITH MYRINGOTOMY      Social History   Tobacco Use  . Smoking status: Never Smoker  . Smokeless tobacco: Never Used  Substance Use Topics  . Alcohol use: Yes    Comment: 5 drinks a week    Family History  Problem Relation Age  of Onset  . Alcohol abuse Father   . Hyperlipidemia Unknown   . Migraines Neg Hx      Current medication list and allergy/intolerance information reviewed:    Current Outpatient Medications  Medication Sig Dispense Refill  . albuterol (PROVENTIL HFA;VENTOLIN HFA) 108 (90 Base) MCG/ACT inhaler Inhale 2 puffs into the lungs every 6 (six) hours as needed for wheezing or shortness of breath. 1 Inhaler 2  . azithromycin (ZITHROMAX) 250 MG tablet Take 2 tablets now and then one tablet for 4 days. 6 tablet 0  . buPROPion (WELLBUTRIN XL) 150 MG 24 hr tablet Take 150 mg by mouth.    . cyclobenzaprine (FLEXERIL) 10 MG tablet Take 1 tablet (10 mg total) by mouth 3 (three) times daily as needed for muscle spasms. 30 tablet 1  . FLUoxetine (PROZAC) 20 MG capsule   1  . Guaifenesin-Codeine 200-10 MG/5ML LIQD 5mL as needed for cough up to three times a day. 120 mL 0  . Icosapent Ethyl 1 g CAPS Take 2 tablets twice a day. 120 capsule 11  . levothyroxine (SYNTHROID, LEVOTHROID) 50 MCG tablet take 1 tablet by mouth once daily 90 tablet 1  . omega-3 acid ethyl esters (LOVAZA) 1 g capsule Take 2 capsules (2 g total) by mouth 2 (two) times daily. 120 capsule 11  . SHAROBEL 0.35 MG tablet   1   No current facility-administered medications for this visit.     Allergies  Allergen Reactions  . Hydrocodone-Acetaminophen Itching  . Shellfish Allergy Anaphylaxis  . Latex Hives      Review of Systems:  Constitutional:  No  fever, no chills, No unintentional weight changes. No significant fatigue.   Cardiac: No  chest pain, No  pressure  Respiratory:  No  shortness of breath.  Gastrointestinal: +abdominal pain, No  nausea, No  vomiting,  +blood in stool, +diarrhea, No  constipation   Musculoskeletal: No new myalgia/arthralgia  Skin: No  Rash  Neurologic: No  weakness, No  dizziness   Exam:  BP (!) 143/96   Pulse 83   Temp 98.1 F (36.7 C) (Oral)   Wt 140 lb (63.5 kg)   BMI 24.80 kg/m    Constitutional: VS see above. General Appearance: alert, well-developed, well-nourished, NAD  Eyes: Normal lids and conjunctive, non-icteric sclera  Ears, Nose, Mouth, Throat: MMM, Normal external inspection ears/nares/mouth/lips/gums.   Neck: No masses, trachea midline.   Respiratory: Normal respiratory effort. no wheeze, no rhonchi, no rales  Cardiovascular: S1/S2 normal, no murmur, no rub/gallop auscultated. RRR.   Gastrointestinal: (+)bloated and tympanic to percussion alternating w dullness, no distension/rebound, no masses. No hepatomegaly, no splenomegaly. No hernia appreciated. Bowel sounds normal. Rectal exam deferred.   Musculoskeletal: Gait normal.   Neurological: Normal balance/coordination. No tremor.   Skin: warm, dry, intact.  Psychiatric: Normal judgment/insight. Normal mood and affect. Oriented x3.      ASSESSMENT/PLAN:  Reviewed UpToDate literature, would strongly consider CT to evaluate for intraabdominal cysts or other pathology if not doing any better or if any other concerning symptoms, but this is not necessarily mandatory in every suspected tapeworm infection, especially at this point since we don't have confirmation of diagnosis, though the photo she brings to the office is pretty convincing. RTC if no better. Recommended stool sample though pt has concerns about finances.   Tapeworm infection - Plan: praziquantel (BILTRICIDE) 600 MG tablet, Ova and parasite examination, Stool Culture  Patient request for diagnostic testing - Plan: POCT urine pregnancy    Patient Instructions  Plan:  Medication sent to pharmacy   If feeling better, nothing else to do - can do stool testing to confirm clearance of the infection  If not, especially if persistent abdominal pain, will need CT scan and stool testing   Tapeworm Infection Tapeworms are parasites that can live in your intestines. When a tapeworm lives inside of you, it is called a parasitic infection.  An adult tapeworm can grow very long and can live inside of a human for many years. There are several types of tapeworms. Most tapeworm infections cause only mild symptoms and are limited to the intestines. One type of tapeworm that is called a pork tapeworm (Taenia solium or T. solium) can cause a more serious infection (cysticercosis). In this type of infection, tapeworm eggs travel to other areas of the body and cause cysts. Tapeworm infection is rare in the [place]. These infections can be treated with medicines to kill the tapeworms. What are the causes? Tapeworm infection is usually caused by eating beef, pork, or fish that is raw or has not been cooked well enough. Animals and fish can become infected with tapeworms that spread to their muscles. When you eat infected meat or fish, the tapeworm eggs get into your intestines and attach there, where they can grow into adult tapeworms. Tapeworm infection can also be caused by drinking water that is contaminated with tapeworm eggs. Cysticercosis is caused by eating food or drinking water contaminated with human or animal feces that contains the eggs of T. solium. What increases the risk? Tapeworm infection is more likely to develop in:  People who visit or live in [place], [place], [place], [place], [place], or [place].  People who work with animals or are exposed to animals.  What are the signs or symptoms? Sometimes, tapeworm infections do not cause any symptoms. If you have symptoms, they may include:  Seeing worm segments in your stool.  Loss of appetite.  Weight loss.  Pain in the abdomen.  Diarrhea.  How is this diagnosed? Tapeworm infection or cysticercosis may be diagnosed based on your symptoms and lifestyle factors, such as travel history and diet. Tests may be done, including:  Blood tests to check for an allergic reaction to the tapeworm or tapeworm eggs.  Imaging studies (CT scan or MRI) to check for  cysts.  Checking a sample of stool (feces) for tapeworm segments or eggs.  How is this treated? This condition is treated with oral medicines to kill the tapeworms. Different medicines may be used for different types of tapeworms. Treatment for cysticercosis may include:  Oral medicines to kill tapeworms and tapeworm eggs.  Medicines to decrease swelling (steroids).  Medicines to prevent seizures (anti-epileptics).  Surgery to remove cysts.  Follow these instructions at home:  Take over-the-counter and prescription medicines only as told by your health care provider. Do not stop taking prescription medicines even if you start to feel better.  Keep all follow-up visits as told by your health care provider. This is important. How is this prevented?  Wash your hands with soap and water often. Always wash your hands before you handle food, and wash your hands before and after you handle raw meat.  Cook fish and meat according to food safety guidelines. Use a meat thermometer to make sure that meat and fish are cooked to the recommended temperatures.  Freeze meat for 12 hours before cooking it to help prevent tapeworm infection. Only eat raw fish (such as sushi) that has been previously frozen.  When you travel to areas where tapeworm infection and other foodborne illnesses are common, follow food safety guidelines for travelers to avoid unsafe foods and drinks. Contact a health care provider if:  You still have symptoms of tapeworm infection after your treatment is complete.  You develop any new symptoms. Get help right away if:  You have a seizure.  You have sudden vision loss. This information is not intended to replace advice given to you by your health care provider. Make sure you discuss any questions you have with your health care provider. Document Released: 05/25/2002 Document Revised: 11/02/2015 Document Reviewed: 05/25/2014 Elsevier Interactive Patient Education  2018  [employer]     Visit summary with medication list and pertinent instructions was printed for patient to review. All questions at time of visit were answered - patient instructed to contact office with any additional concerns. ER/RTC precautions were reviewed with the patient. Follow-up plan: Return if symptoms worsen or fail to improve.  Note: Total time spent 25 minutes, greater than 50% of the visit was spent face-to-face counseling and coordinating care for the following: The primary encounter diagnosis was Tapeworm infection. A diagnosis of Patient request for diagnostic testing was also pertinent to this visit.Marland Kitchen.  Please note: voice recognition software was used to produce this document, and typos may escape review. Please contact Dr. Lyn HollingsheadAlexander for any needed clarifications.

## 2017-01-30 NOTE — Patient Instructions (Signed)
Plan:  Medication sent to pharmacy   If feeling better, nothing else to do - can do stool testing to confirm clearance of the infection  If not, especially if persistent abdominal pain, will need CT scan and stool testing   Tapeworm Infection Tapeworms are parasites that can live in your intestines. When a tapeworm lives inside of you, it is called a parasitic infection. An adult tapeworm can grow very long and can live inside of a human for many years. There are several types of tapeworms. Most tapeworm infections cause only mild symptoms and are limited to the intestines. One type of tapeworm that is called a pork tapeworm (Taenia solium or T. solium) can cause a more serious infection (cysticercosis). In this type of infection, tapeworm eggs travel to other areas of the body and cause cysts. Tapeworm infection is rare in the [place]. These infections can be treated with medicines to kill the tapeworms. What are the causes? Tapeworm infection is usually caused by eating beef, pork, or fish that is raw or has not been cooked well enough. Animals and fish can become infected with tapeworms that spread to their muscles. When you eat infected meat or fish, the tapeworm eggs get into your intestines and attach there, where they can grow into adult tapeworms. Tapeworm infection can also be caused by drinking water that is contaminated with tapeworm eggs. Cysticercosis is caused by eating food or drinking water contaminated with human or animal feces that contains the eggs of T. solium. What increases the risk? Tapeworm infection is more likely to develop in:  People who visit or live in [place], [place], [place], [place], [place], or [place].  People who work with animals or are exposed to animals.  What are the signs or symptoms? Sometimes, tapeworm infections do not cause any symptoms. If you have symptoms, they may include:  Seeing worm segments in your stool.  Loss of  appetite.  Weight loss.  Pain in the abdomen.  Diarrhea.  How is this diagnosed? Tapeworm infection or cysticercosis may be diagnosed based on your symptoms and lifestyle factors, such as travel history and diet. Tests may be done, including:  Blood tests to check for an allergic reaction to the tapeworm or tapeworm eggs.  Imaging studies (CT scan or MRI) to check for cysts.  Checking a sample of stool (feces) for tapeworm segments or eggs.  How is this treated? This condition is treated with oral medicines to kill the tapeworms. Different medicines may be used for different types of tapeworms. Treatment for cysticercosis may include:  Oral medicines to kill tapeworms and tapeworm eggs.  Medicines to decrease swelling (steroids).  Medicines to prevent seizures (anti-epileptics).  Surgery to remove cysts.  Follow these instructions at home:  Take over-the-counter and prescription medicines only as told by your health care provider. Do not stop taking prescription medicines even if you start to feel better.  Keep all follow-up visits as told by your health care provider. This is important. How is this prevented?  Wash your hands with soap and water often. Always wash your hands before you handle food, and wash your hands before and after you handle raw meat.  Cook fish and meat according to food safety guidelines. Use a meat thermometer to make sure that meat and fish are cooked to the recommended temperatures.  Freeze meat for 12 hours before cooking it to help prevent tapeworm infection. Only eat raw fish (such as sushi) that has been previously  frozen.  When you travel to areas where tapeworm infection and other foodborne illnesses are common, follow food safety guidelines for travelers to avoid unsafe foods and drinks. Contact a health care provider if:  You still have symptoms of tapeworm infection after your treatment is complete.  You develop any new symptoms. Get  help right away if:  You have a seizure.  You have sudden vision loss. This information is not intended to replace advice given to you by your health care provider. Make sure you discuss any questions you have with your health care provider. Document Released: 05/25/2002 Document Revised: 11/02/2015 Document Reviewed: 05/25/2014 Elsevier Interactive Patient Education  2018 [employer]

## 2017-02-03 DIAGNOSIS — B719 Cestode infection, unspecified: Secondary | ICD-10-CM | POA: Diagnosis not present

## 2017-02-04 LAB — OVA AND PARASITE EXAMINATION
CONCENTRATE RESULT: NONE SEEN
MICRO NUMBER:: 81302263
SPECIMEN QUALITY:: ADEQUATE
TRICHROME RESULT:: NONE SEEN

## 2017-02-07 LAB — STOOL CULTURE
MICRO NUMBER: 81302261
MICRO NUMBER:: 81302259
MICRO NUMBER:: 81302260
SHIGA RESULT:: NOT DETECTED
SPECIMEN QUALITY: ADEQUATE
SPECIMEN QUALITY:: ADEQUATE
SPECIMEN QUALITY:: ADEQUATE

## 2017-02-25 ENCOUNTER — Ambulatory Visit: Payer: BLUE CROSS/BLUE SHIELD | Admitting: Physician Assistant

## 2017-02-25 ENCOUNTER — Encounter: Payer: Self-pay | Admitting: Physician Assistant

## 2017-02-25 VITALS — BP 133/84 | HR 116 | Temp 98.2°F | Ht 63.0 in | Wt 138.0 lb

## 2017-02-25 DIAGNOSIS — R000 Tachycardia, unspecified: Secondary | ICD-10-CM | POA: Diagnosis not present

## 2017-02-25 DIAGNOSIS — J069 Acute upper respiratory infection, unspecified: Secondary | ICD-10-CM | POA: Diagnosis not present

## 2017-02-25 DIAGNOSIS — J0120 Acute ethmoidal sinusitis, unspecified: Secondary | ICD-10-CM | POA: Diagnosis not present

## 2017-02-25 DIAGNOSIS — B719 Cestode infection, unspecified: Secondary | ICD-10-CM | POA: Diagnosis not present

## 2017-02-25 MED ORDER — AMOXICILLIN-POT CLAVULANATE 875-125 MG PO TABS: 1.0000 | ORAL_TABLET | Freq: Two times a day (BID) | ORAL | 0 refills | Status: AC

## 2017-02-25 MED ORDER — PREDNISONE 50 MG PO TABS: ORAL_TABLET | ORAL | 0 refills | Status: DC

## 2017-02-25 NOTE — Patient Instructions (Signed)
Upper Respiratory Infection, Adult Most upper respiratory infections (URIs) are caused by a virus. A URI affects the nose, throat, and upper air passages. The most common type of URI is often called "the common cold." Follow these instructions at home:  Take medicines only as told by your doctor.  Gargle warm saltwater or take cough drops to comfort your throat as told by your doctor.  Use a warm mist humidifier or inhale steam from a shower to increase air moisture. This may make it easier to breathe.  Drink enough fluid to keep your pee (urine) clear or pale yellow.  Eat soups and other clear broths.  Have a healthy diet.  Rest as needed.  Go back to work when your fever is gone or your doctor says it is okay. ? You may need to stay home longer to avoid giving your URI to others. ? You can also wear a face mask and wash your hands often to prevent spread of the virus.  Use your inhaler more if you have asthma.  Do not use any tobacco products, including cigarettes, chewing tobacco, or electronic cigarettes. If you need help quitting, ask your doctor. Contact a doctor if:  You are getting worse, not better.  Your symptoms are not helped by medicine.  You have chills.  You are getting more short of breath.  You have brown or red mucus.  You have yellow or brown discharge from your nose.  You have pain in your face, especially when you bend forward.  You have a fever.  You have puffy (swollen) neck glands.  You have pain while swallowing.  You have white areas in the back of your throat. Get help right away if:  You have very bad or constant: ? Headache. ? Ear pain. ? Pain in your forehead, behind your eyes, and over your cheekbones (sinus pain). ? Chest pain.  You have long-lasting (chronic) lung disease and any of the following: ? Wheezing. ? Long-lasting cough. ? Coughing up blood. ? A change in your usual mucus.  You have a stiff neck.  You have  changes in your: ? Vision. ? Hearing. ? Thinking. ? Mood. This information is not intended to replace advice given to you by your health care provider. Make sure you discuss any questions you have with your health care provider. Document Released: 08/21/2007 Document Revised: 11/05/2015 Document Reviewed: 06/09/2013 Elsevier Interactive Patient Education  2018 Elsevier Inc.  

## 2017-02-25 NOTE — Progress Notes (Addendum)
Subjective:    Patient ID: Garner GavelKristin Hailes, female    DOB: 11/18/1980, 36 y.o.   MRN: [id number]  HPI Patient is a 36 y/o female who presents with sinus and chest congestion. She states that her symptoms started on Friday and worsened yesterday. She reports headache, chills, diaphoresis, tender lymph nodes, sneezing, nonproductive cough, chest tightness, right ear pain with sensation of popping, and body aches last night which have subsided. She denies fever, rhinorrhea, sore throat, sinus pain, nausea, vomiting, diarrhea, dizziness, shortness of breath, chest pain, or palpitations. She has tried using a netty pot, Mucinex DM, and sudaphed without significant relief.  Patient also reports history of tapeworm diagnosed on 01/30/17. She was advised to follow-up for stool culture recheck to confirm that this has resolved. She reports complete resolution of symptoms, denying diarrhea, abdominal pain, or bloating.  .. Active Ambulatory Problems    Diagnosis Date Noted  . IBS (irritable bowel syndrome) 10/27/2013  . Thyroid activity decreased 10/27/2013  . Migraine without aura, without mention of intractable migraine without mention of status migrainosus 10/29/2013  . Esophageal reflux 10/29/2013  . Depression, major, recurrent (HCC) 10/29/2013  . Mixed anxiety depressive disorder 10/29/2013  . B12 deficiency 10/29/2013  . Hypertriglyceridemia 10/29/2013  . Elevated liver enzymes 11/02/2013  . Poor venous access 01/24/2015  . Post concussive syndrome 05/21/2015  . Cervical pain (neck) 06/26/2015  . Intractable migraine 06/30/2015  . Migraine with status migrainosus 07/15/2015  . PIH (pregnancy induced hypertension) 07/28/2015  . Diastasis recti 02/20/2016  . Low back pain with radiation, unspecified laterality 02/23/2016  . Overweight (BMI 25.0-29.9) 03/20/2016  . Elevated blood pressure reading 05/03/2016  . Tachycardia 02/25/2017  . Tapeworm infection 02/25/2017   Resolved Ambulatory  Problems    Diagnosis Date Noted  . Pregnancy 01/24/2015  . Viral gastroenteritis 01/24/2015  . Nausea and vomiting during pregnancy 03/07/2015  . Dizziness 03/07/2015  . Dehydration 03/07/2015  . Musculoskeletal neck pain 06/26/2015   Past Medical History:  Diagnosis Date  . Anxiety   . Depression   . Hypothyroidism   . Migraine      Review of Systems  All other systems reviewed and are negative. See HPI for pertinent positives and negatives     Objective:   Physical Exam  Constitutional: She is oriented to person, place, and time. She appears well-developed and well-nourished.  HENT:  Head: Normocephalic and atraumatic.  Right Ear: Tympanic membrane and external ear normal.  Left Ear: Tympanic membrane and external ear normal.  Nose: Mucosal edema (and erythema) present. Right sinus exhibits no maxillary sinus tenderness and no frontal sinus tenderness. Left sinus exhibits no maxillary sinus tenderness and no frontal sinus tenderness.  Mouth/Throat: Uvula is midline and mucous membranes are normal. Posterior oropharyngeal erythema present. No oropharyngeal exudate or posterior oropharyngeal edema.  Bilateral ear canals erythematous, but no drainage or swelling.  Cardiovascular: Normal rate, regular rhythm and normal heart sounds.  Pulmonary/Chest: Effort normal and breath sounds normal.  Lymphadenopathy:    She has no cervical adenopathy.  Neurological: She is alert and oriented to person, place, and time.  Skin: Skin is warm and dry.  Psychiatric: She has a normal mood and affect. Her behavior is normal. Thought content normal.  Vitals reviewed.     Assessment & Plan:  .Marland Kitchen.Marland Kitchen.[name] was seen today for cough, headache and nasal congestion.  Diagnoses and all orders for this visit:  Acute upper respiratory infection -     predniSONE (DELTASONE) 50 MG tablet;  One tab PO daily for 5 days.  Tapeworm infection -     Ova and parasite examination  Tachycardia  Acute  non-recurrent ethmoidal sinusitis -     predniSONE (DELTASONE) 50 MG tablet; One tab PO daily for 5 days. -     amoxicillin-clavulanate (AUGMENTIN) 875-125 MG tablet; Take 1 tablet by mouth 2 (two) times daily for 10 days.   Acute upper respiratory infection/acute sinusitis - Due to the duration of the patients symptoms being only 5 days, advised patient that it is likely a viral upper respiratory infection that will resolve in the next few days. Provided patient with 5 day course of prednisone for acute inflammatory relief. Advised patient to continue OTC cold & flu medications, nasal spray and nasal saline for symptomatic relief. Advised patient that if her symptoms continue to worsen over the next few days then we can treat her with Augmentin for bacterial causes, however she should not fill the prescription unless her condition worsens.  Tachycardia -likely due to current infection. Stay hydrated. Continue to monitor. Stay away from decongestants.   History of tapeworm infection - Recheck O&P to confirm resolution of tapeworm infection. Patient reports complete resolution of symptoms, however would like to confirm cure.

## 2017-02-25 NOTE — Progress Notes (Deleted)
   Subjective:    Patient ID: Melissa Sharp, female    DOB: 06/15/1980, 36 y.o.   MRN: [id number]  HPI    Review of Systems     Objective:   Physical Exam        Assessment & Plan:

## 2017-02-28 DIAGNOSIS — B719 Cestode infection, unspecified: Secondary | ICD-10-CM | POA: Diagnosis not present

## 2017-03-03 LAB — OVA AND PARASITE EXAMINATION
CONCENTRATE RESULT: NONE SEEN
SPECIMEN QUALITY:: ADEQUATE
TRICHROME RESULT:: NONE SEEN
VKL: 81409145

## 2017-03-03 NOTE — Progress Notes (Signed)
Call pt: no ova and parasites found. Great news.

## 2017-03-31 ENCOUNTER — Telehealth: Payer: Self-pay | Admitting: Neurology

## 2017-03-31 NOTE — Telephone Encounter (Signed)
Sent to Stanton KidneyDebra in medical records.

## 2017-03-31 NOTE — Telephone Encounter (Signed)
Pts lawyer is requesting all the notes we have. Pt said to give her a call to let her know when she would be able to pick everything up.

## 2017-03-31 NOTE — Telephone Encounter (Signed)
Called and LVM asking for call back. Patient will need to sign a release of information form here at the office before medical records can be released.

## 2017-04-30 ENCOUNTER — Encounter: Payer: Self-pay | Admitting: Physician Assistant

## 2017-04-30 ENCOUNTER — Ambulatory Visit (INDEPENDENT_AMBULATORY_CARE_PROVIDER_SITE_OTHER): Payer: BLUE CROSS/BLUE SHIELD | Admitting: Physician Assistant

## 2017-04-30 VITALS — BP 157/96 | HR 116 | Temp 97.8°F | Resp 14 | Wt 137.0 lb

## 2017-04-30 DIAGNOSIS — E039 Hypothyroidism, unspecified: Secondary | ICD-10-CM | POA: Diagnosis not present

## 2017-04-30 DIAGNOSIS — R000 Tachycardia, unspecified: Secondary | ICD-10-CM

## 2017-04-30 DIAGNOSIS — Z5181 Encounter for therapeutic drug level monitoring: Secondary | ICD-10-CM | POA: Diagnosis not present

## 2017-04-30 DIAGNOSIS — R030 Elevated blood-pressure reading, without diagnosis of hypertension: Secondary | ICD-10-CM

## 2017-04-30 DIAGNOSIS — J069 Acute upper respiratory infection, unspecified: Secondary | ICD-10-CM

## 2017-04-30 MED ORDER — IPRATROPIUM BROMIDE 0.06 % NA SOLN: 2.0000 | Freq: Four times a day (QID) | NASAL | 0 refills | Status: DC | PRN

## 2017-04-30 NOTE — Patient Instructions (Addendum)
- continue netty pot - atrovent 2 sprays up to 4 times per day - avoid decongestants (sudafed, pseudephedrine) - drink at least 8-11 glasses of water per day  Sinus Tachycardia Sinus tachycardia is a kind of fast heartbeat. In sinus tachycardia, the heart beats more than 100 times a minute. Sinus tachycardia starts in a part of the heart called the sinus node. Sinus tachycardia may be harmless, or it may be a sign of a serious condition. What are the causes? This condition may be caused by:  Exercise or exertion.  A fever.  Pain.  Loss of body fluids (dehydration).  Severe bleeding (hemorrhage).  Anxiety and stress.  Certain substances, including: ? Alcohol. ? Caffeine. ? Tobacco and nicotine products. ? Diet pills. ? Illegal drugs.  Medical conditions including: ? Heart disease. ? An infection. ? An overactive thyroid (hyperthyroidism). ? A lack of red blood cells (anemia).  What are the signs or symptoms? Symptoms of this condition include:  A feeling that the heart is beating quickly (palpitations).  Suddenly noticing your heartbeat (cardiac awareness).  Dizziness.  Tiredness (fatigue).  Shortness of breath.  Chest pain.  Nausea.  Fainting.  How is this diagnosed? This condition is diagnosed with:  A physical exam.  Other tests, such as: ? Blood tests. ? An electrocardiogram (ECG). This test measures the electrical activity of the heart. ? Holter monitoring. For this test, you wear a device that records your heartbeat for one or more days.  You may be referred to a heart specialist (cardiologist). How is this treated? Treatment for this condition depends on the cause or underlying condition. Treatment may involve:  Treating the underlying condition.  Taking new medicines or changing your current medicines as told by your health care provider.  Making changes to your diet or lifestyle.  Practicing relaxation methods.  Follow these  instructions at home: Lifestyle  Do not use any products that contain nicotine or tobacco, such as cigarettes and e-cigarettes. If you need help quitting, ask your health care provider.  Learn relaxation methods, like deep breathing, to help you when you get stressed or anxious.  Do not use illegal drugs, such as cocaine.  Do not abuse alcohol. Limit alcohol intake to no more than 1 drink a day for non-pregnant women and 2 drinks a day for men. One drink equals 12 oz of beer, 5 oz of wine, or 1 oz of hard liquor.  Find time to rest and relax often. This reduces stress.  Avoid: ? Caffeine. ? Stimulants such as over-the-counter diet pills or pills that help you to stay awake. ? Situations that cause anxiety or stress. General instructions  Drink enough fluids to keep your urine clear or pale yellow.  Take over-the-counter and prescription medicines only as told by your health care provider.  Keep all follow-up visits as told by your health care provider. This is important. Contact a health care provider if:  You have a fever.  You have vomiting or diarrhea that keeps happening (is persistent). Get help right away if:  You have pain in your chest, upper arms, jaw, or neck.  You become weak or dizzy.  You feel faint.  You have palpitations that do not go away. This information is not intended to replace advice given to you by your health care provider. Make sure you discuss any questions you have with your health care provider. Document Released: 04/11/2004 Document Revised: 09/30/2015 Document Reviewed: 09/16/2014 Elsevier Interactive Patient Education  2018 Elsevier  Inc. Sinusitis, Adult Sinusitis is soreness and inflammation of your sinuses. Sinuses are hollow spaces in the bones around your face. They are located:  Around your eyes.  In the middle of your forehead.  Behind your nose.  In your cheekbones.  Your sinuses and nasal passages are lined with a stringy  fluid (mucus). Mucus normally drains out of your sinuses. When your nasal tissues get inflamed or swollen, the mucus can get trapped or blocked so air cannot flow through your sinuses. This lets bacteria, viruses, and funguses grow, and that leads to infection. Follow these instructions at home: Medicines  Take, use, or apply over-the-counter and prescription medicines only as told by your doctor. These may include nasal sprays.  If you were prescribed an antibiotic medicine, take it as told by your doctor. Do not stop taking the antibiotic even if you start to feel better. Hydrate and Humidify  Drink enough water to keep your pee (urine) clear or pale yellow.  Use a cool mist humidifier to keep the humidity level in your home above 50%.  Breathe in steam for 10-15 minutes, 3-4 times a day or as told by your doctor. You can do this in the bathroom while a hot shower is running.  Try not to spend time in cool or dry air. Rest  Rest as much as possible.  Sleep with your head raised (elevated).  Make sure to get enough sleep each night. General instructions  Put a warm, moist washcloth on your face 3-4 times a day or as told by your doctor. This will help with discomfort.  Wash your hands often with soap and water. If there is no soap and water, use hand sanitizer.  Do not smoke. Avoid being around people who are smoking (secondhand smoke).  Keep all follow-up visits as told by your doctor. This is important. Contact a doctor if:  You have a fever.  Your symptoms get worse.  Your symptoms do not get better within 10 days. Get help right away if:  You have a very bad headache.  You cannot stop throwing up (vomiting).  You have pain or swelling around your face or eyes.  You have trouble seeing.  You feel confused.  Your neck is stiff.  You have trouble breathing. This information is not intended to replace advice given to you by your health care provider. Make sure  you discuss any questions you have with your health care provider. Document Released: 08/21/2007 Document Revised: 10/29/2015 Document Reviewed: 12/28/2014 Elsevier Interactive Patient Education  [employer]2018 Elsevier Inc.

## 2017-04-30 NOTE — Progress Notes (Signed)
HPI:                                                                Melissa Sharp is a 37 y.o. female who presents to [facility] today for sinus issue  Sinusitis  This is a new problem. The current episode started in the past 7 days (x 3 days). The problem is unchanged. There has been no fever. Associated symptoms include congestion, coughing (dry), sinus pressure, sneezing and swollen glands. Pertinent negatives include no chills, ear pain, hoarse voice, shortness of breath or sore throat. Past treatments include oral decongestants and saline sprays. The treatment provided mild relief.    No flowsheet data found.  No flowsheet data found.    Past Medical History:  Diagnosis Date  . Anxiety   . Depression   . Hypothyroidism   . Migraine    Past Surgical History:  Procedure Laterality Date  . TONSILECTOMY/ADENOIDECTOMY WITH MYRINGOTOMY     Social History   Tobacco Use  . Smoking status: Never Smoker  . Smokeless tobacco: Never Used  Substance Use Topics  . Alcohol use: Yes    Comment: 5 drinks a week   family history includes Alcohol abuse in her father; Hyperlipidemia in her unknown relative.    ROS: negative except as noted in the HPI  Medications: Current Outpatient Medications  Medication Sig Dispense Refill  . buPROPion (WELLBUTRIN XL) 150 MG 24 hr tablet Take 150 mg by mouth.    . cyclobenzaprine (FLEXERIL) 10 MG tablet Take 1 tablet (10 mg total) by mouth 3 (three) times daily as needed for muscle spasms. 30 tablet 1  . FLUoxetine (PROZAC) 20 MG capsule   1  . Icosapent Ethyl 1 g CAPS Take 2 tablets twice a day. 120 capsule 11  . ipratropium (ATROVENT) 0.06 % nasal spray Place 2 sprays into both nostrils 4 (four) times daily as needed. 15 mL 0  . levothyroxine (SYNTHROID, LEVOTHROID) 50 MCG tablet take 1 tablet by mouth once daily 90 tablet 1  . omega-3 acid ethyl esters (LOVAZA) 1 g capsule Take 2 capsules  (2 g total) by mouth 2 (two) times daily. 120 capsule 11  . SHAROBEL 0.35 MG tablet   1   No current facility-administered medications for this visit.    Allergies  Allergen Reactions  . Hydrocodone-Acetaminophen Itching  . Shellfish Allergy Anaphylaxis  . Latex Hives       Objective:  BP (!) 157/96   Pulse (!) 116   Temp 97.8 F (36.6 C) (Oral)   Resp 14   Wt 137 lb (62.1 kg)   SpO2 99%   BMI 24.27 kg/m  Gen:  alert, ill-appearing, not toxic-appearing, no distress, appropriate for age HEENT: head normocephalic without obvious abnormality, conjunctiva and cornea clear, wearing glasses, nasal mucosa edematous, nares with erythema, there is generalized sinus tenderness, oropharynx clear without erythema or edema, moist mucous membranes, neck supple, no adenopathy, trachea midline Pulm: Normal work of breathing, normal phonation, clear to auscultation bilaterally, no wheezes, rales or rhonchi CV: tachycardic 116-120, regular rhythm, s1 and s2 distinct, no murmurs, clicks or rubs  Neuro: alert and oriented x 3, no tremor MSK: extremities atraumatic, normal gait and station Skin: intact, no rashes  on exposed skin, no jaundice, no cyanosis  No results found for this or any previous visit (from the past 72 hour(s)). No results found.    Assessment and Plan: 37 y.o. female with   1. Viral upper respiratory illness - 3 days of congestion and rhinitis without fever, myalgia. Continue symptomatic care.  - ipratropium (ATROVENT) 0.06 % nasal spray; Place 2 sprays into both nostrils 4 (four) times daily as needed.  Dispense: 15 mL; Refill: 0  2. Tachycardia with heart rate 100-120 beats per minute, Elevated blood pressure reading Pulse Readings from Last 3 Encounters:  04/30/17 (!) 116  02/25/17 (!) 116  01/30/17 83   BP Readings from Last 3 Encounters:  04/30/17 (!) 157/96  02/25/17 133/84  01/30/17 (!) 143/96  - patient reports she is "always" tachycardic in the office,  especially when she is sick. Denies chest pain, palpitations or dyspnea today - checking TSH - counseled to push PO fluids and avoid oral decongestants - encouraged to f/u with PCP in 1 week to discuss further work-up   Medication monitoring encounter - TSH  Hypothyroidism, unspecified type - TSH   Patient education and anticipatory guidance given Patient agrees with treatment plan Follow-up with PCP in 1 week for elevated HR and BP or sooner as needed if symptoms worsen or fail to improve  Levonne Hubertharley E. Jamell Opfer PA-C

## 2017-05-07 ENCOUNTER — Ambulatory Visit: Payer: BLUE CROSS/BLUE SHIELD | Admitting: Physician Assistant

## 2017-05-07 ENCOUNTER — Encounter: Payer: Self-pay | Admitting: Physician Assistant

## 2017-05-07 VITALS — BP 132/82 | HR 92

## 2017-05-07 DIAGNOSIS — J3089 Other allergic rhinitis: Secondary | ICD-10-CM

## 2017-05-07 DIAGNOSIS — F419 Anxiety disorder, unspecified: Secondary | ICD-10-CM

## 2017-05-07 DIAGNOSIS — R000 Tachycardia, unspecified: Secondary | ICD-10-CM | POA: Diagnosis not present

## 2017-05-07 DIAGNOSIS — R030 Elevated blood-pressure reading, without diagnosis of hypertension: Secondary | ICD-10-CM | POA: Diagnosis not present

## 2017-05-07 MED ORDER — FLUTICASONE PROPIONATE 50 MCG/ACT NA SUSP: 2.0000 | Freq: Every day | NASAL | 6 refills | Status: DC

## 2017-05-08 ENCOUNTER — Encounter: Payer: Self-pay | Admitting: Physician Assistant

## 2017-05-08 DIAGNOSIS — J3089 Other allergic rhinitis: Secondary | ICD-10-CM | POA: Insufficient documentation

## 2017-05-08 DIAGNOSIS — F419 Anxiety disorder, unspecified: Secondary | ICD-10-CM | POA: Insufficient documentation

## 2017-05-08 NOTE — Progress Notes (Signed)
   Subjective:    Patient ID: Melissa Sharp, female    DOB: 04/13/1980, 37 y.o.   MRN: [id number]  HPI Pt is a 37 yo pleasant female who presents to the clinic to follow up on BP and HR after elevated at 04/30/17 visit while being dx with URI. She feels much better. She denies any sinus pressure, cough, SOB, CP, wheezing. She has not been checking BP at home. She denies any headache. She continues to have some sinus congestion and rhinorrhea.   She would also like a refill on xanax. She does not use daily. She is on wellbutrin and prozac for anxiety and depression.   .. Active Ambulatory Problems    Diagnosis Date Noted  . IBS (irritable bowel syndrome) 10/27/2013  . Thyroid activity decreased 10/27/2013  . Migraine without aura, without mention of intractable migraine without mention of status migrainosus 10/29/2013  . Esophageal reflux 10/29/2013  . Depression, major, recurrent (HCC) 10/29/2013  . Mixed anxiety depressive disorder 10/29/2013  . B12 deficiency 10/29/2013  . Hypertriglyceridemia 10/29/2013  . Elevated liver enzymes 11/02/2013  . Poor venous access 01/24/2015  . Post concussive syndrome 05/21/2015  . Cervical pain (neck) 06/26/2015  . Intractable migraine 06/30/2015  . Migraine with status migrainosus 07/15/2015  . PIH (pregnancy induced hypertension) 07/28/2015  . Diastasis recti 02/20/2016  . Low back pain with radiation, unspecified laterality 02/23/2016  . Overweight (BMI 25.0-29.9) 03/20/2016  . Elevated blood pressure reading 05/03/2016  . Tachycardia with heart rate 100-120 beats per minute 02/25/2017  . Tapeworm infection 02/25/2017   Resolved Ambulatory Problems    Diagnosis Date Noted  . Pregnancy 01/24/2015  . Viral gastroenteritis 01/24/2015  . Nausea and vomiting during pregnancy 03/07/2015  . Dizziness 03/07/2015  . Dehydration 03/07/2015  . Musculoskeletal neck pain 06/26/2015   Past Medical History:  Diagnosis Date  . Anxiety   . Depression    . Hypothyroidism   . Migraine       Review of Systems  All other systems reviewed and are negative.      Objective:   Physical Exam  Constitutional: She is oriented to person, place, and time. She appears well-developed and well-nourished.  HENT:  Head: Normocephalic and atraumatic.  Cardiovascular: Normal rate and normal heart sounds.  Pulmonary/Chest: Effort normal and breath sounds normal. She has no wheezes.  Neurological: She is alert and oriented to person, place, and time.  Psychiatric: She has a normal mood and affect. Her behavior is normal.          Assessment & Plan:  Marland Kitchen.Marland Kitchen.Diagnoses and all orders for this visit:  Elevated blood pressure reading  Tachycardia  Non-seasonal allergic rhinitis, unspecified trigger -     fluticasone (FLONASE) 50 MCG/ACT nasal spray; Place 2 sprays into both nostrils daily.  Anxiety   HR and BP are much better today. Pt is asymptomatic. I did discuss to stay away from decongestants when sick. Stay hydrated. Try flonase for allergic rhinitis.   Xanax refilled as needed. Discussed abuse potential and to use sparingly.

## 2017-05-23 ENCOUNTER — Other Ambulatory Visit: Payer: Self-pay | Admitting: *Deleted

## 2017-05-23 DIAGNOSIS — E039 Hypothyroidism, unspecified: Secondary | ICD-10-CM

## 2017-05-23 MED ORDER — LEVOTHYROXINE SODIUM 50 MCG PO TABS: 50.0000 ug | ORAL_TABLET | Freq: Every day | ORAL | 0 refills | Status: DC

## 2017-06-18 ENCOUNTER — Ambulatory Visit: Payer: BLUE CROSS/BLUE SHIELD | Admitting: Physician Assistant

## 2017-06-18 ENCOUNTER — Encounter: Payer: Self-pay | Admitting: Physician Assistant

## 2017-06-18 VITALS — BP 133/94 | HR 105 | Temp 98.2°F | Ht 63.0 in | Wt 136.0 lb

## 2017-06-18 DIAGNOSIS — R000 Tachycardia, unspecified: Secondary | ICD-10-CM

## 2017-06-18 DIAGNOSIS — R059 Cough, unspecified: Secondary | ICD-10-CM

## 2017-06-18 DIAGNOSIS — R05 Cough: Secondary | ICD-10-CM | POA: Diagnosis not present

## 2017-06-18 DIAGNOSIS — J301 Allergic rhinitis due to pollen: Secondary | ICD-10-CM

## 2017-06-18 DIAGNOSIS — J0101 Acute recurrent maxillary sinusitis: Secondary | ICD-10-CM

## 2017-06-18 MED ORDER — BENZONATATE 200 MG PO CAPS: 200.0000 mg | ORAL_CAPSULE | Freq: Two times a day (BID) | ORAL | 0 refills | Status: DC | PRN

## 2017-06-18 MED ORDER — METHYLPREDNISOLONE SODIUM SUCC 125 MG IJ SOLR
125.0000 mg | Freq: Once | INTRAMUSCULAR | Status: AC
  Administered 2017-06-18: 125 mg via INTRAMUSCULAR

## 2017-06-18 MED ORDER — HYDROCODONE-HOMATROPINE 5-1.5 MG/5ML PO SYRP: 5.0000 mL | ORAL_SOLUTION | Freq: Two times a day (BID) | ORAL | 0 refills | Status: DC | PRN

## 2017-06-18 MED ORDER — AMOXICILLIN-POT CLAVULANATE 875-125 MG PO TABS
1.0000 | ORAL_TABLET | Freq: Two times a day (BID) | ORAL | 0 refills | Status: DC
Start: 2017-06-18 — End: 2017-07-01

## 2017-06-18 NOTE — Progress Notes (Signed)
Subjective:    Patient ID: Melissa Sharp, female    DOB: 01/20/1981, 37 y.o.   MRN: [id number]  HPI Patient presents with 4-5 days worth of cough, congestion, sore throat, ear pain, and intermittent fevers/chills. She denies any nausea, vomiting. She has been taking OTC Zyrtec, benadryl, mucinex, with no relief. She states the cough is productive and the sputum is greenish color. She has not taken her temperature but states that she has gone back and forth between feeling real hot and shivering.  .. Active Ambulatory Problems    Diagnosis Date Noted  . IBS (irritable bowel syndrome) 10/27/2013  . Thyroid activity decreased 10/27/2013  . Migraine without aura, without mention of intractable migraine without mention of status migrainosus 10/29/2013  . Esophageal reflux 10/29/2013  . Depression, major, recurrent (HCC) 10/29/2013  . Mixed anxiety depressive disorder 10/29/2013  . B12 deficiency 10/29/2013  . Hypertriglyceridemia 10/29/2013  . Elevated liver enzymes 11/02/2013  . Poor venous access 01/24/2015  . Post concussive syndrome 05/21/2015  . Cervical pain (neck) 06/26/2015  . Intractable migraine 06/30/2015  . Migraine with status migrainosus 07/15/2015  . PIH (pregnancy induced hypertension) 07/28/2015  . Diastasis recti 02/20/2016  . Low back pain with radiation, unspecified laterality 02/23/2016  . Overweight (BMI 25.0-29.9) 03/20/2016  . Elevated blood pressure reading 05/03/2016  . Tachycardia with heart rate 100-120 beats per minute 02/25/2017  . Tapeworm infection 02/25/2017  . Non-seasonal allergic rhinitis 05/08/2017  . Anxiety 05/08/2017   Resolved Ambulatory Problems    Diagnosis Date Noted  . Pregnancy 01/24/2015  . Viral gastroenteritis 01/24/2015  . Nausea and vomiting during pregnancy 03/07/2015  . Dizziness 03/07/2015  . Dehydration 03/07/2015  . Musculoskeletal neck pain 06/26/2015   Past Medical History:  Diagnosis Date  . Anxiety   . Depression    . Hypothyroidism   . Migraine       Review of Systems  Constitutional: Positive for chills, fatigue and fever.  HENT: Positive for congestion, ear pain, rhinorrhea, sinus pressure, sinus pain and sore throat.   Respiratory: Positive for cough.   Gastrointestinal: Negative for nausea and vomiting.  Neurological: Positive for headaches.       Objective:   Physical Exam  Constitutional: She is oriented to person, place, and time. She appears well-developed and well-nourished. No distress.  HENT:  Head: Normocephalic and atraumatic.  Right Ear: External ear normal. Tympanic membrane is erythematous.  Left Ear: External ear normal. Tympanic membrane is erythematous.  Nose: Right sinus exhibits maxillary sinus tenderness. Left sinus exhibits maxillary sinus tenderness.  Mouth/Throat: Posterior oropharyngeal erythema ( with post nasal drip) present. No oropharyngeal exudate, posterior oropharyngeal edema or tonsillar abscesses.  Eyes: Conjunctivae are normal.  Neck: Normal range of motion. Neck supple. No thyromegaly present.  Cardiovascular: Regular rhythm and normal heart sounds.  Tachycardic  Pulmonary/Chest: Effort normal and breath sounds normal. She has no wheezes.  Musculoskeletal: Normal range of motion.  Lymphadenopathy:    She has no cervical adenopathy.  Neurological: She is alert and oriented to person, place, and time.  Skin: Skin is warm and dry.  Psychiatric: She has a normal mood and affect. Her behavior is normal.          Assessment & Plan:  Marland Kitchen.Marland Kitchen.Diagnoses and all orders for this visit:  Acute recurrent maxillary sinusitis -     benzonatate (TESSALON) 200 MG capsule; Take 1 capsule (200 mg total) by mouth 2 (two) times daily as needed for cough. -  HYDROcodone-homatropine (HYCODAN) 5-1.5 MG/5ML syrup; Take 5 mLs by mouth every 12 (twelve) hours as needed. -     amoxicillin-clavulanate (AUGMENTIN) 875-125 MG tablet; Take 1 tablet by mouth 2 (two) times  daily. -     methylPREDNISolone sodium succinate (SOLU-MEDROL) 125 mg/2 mL injection 125 mg  Tachycardia  Seasonal allergic rhinitis due to pollen  Cough -     benzonatate (TESSALON) 200 MG capsule; Take 1 capsule (200 mg total) by mouth 2 (two) times daily as needed for cough. -     HYDROcodone-homatropine (HYCODAN) 5-1.5 MG/5ML syrup; Take 5 mLs by mouth every 12 (twelve) hours as needed. -     amoxicillin-clavulanate (AUGMENTIN) 875-125 MG tablet; Take 1 tablet by mouth 2 (two) times daily. -     methylPREDNISolone sodium succinate (SOLU-MEDROL) 125 mg/2 mL injection 125 mg   I certainly think there could be an allergic versus viral etiology of symptoms.  She has had multiple upper respiratory infections over the past year.  Patient was given Solu-Medrol shot in the office.  She was given some Tessalon Perles for cough as well as Hycodan at night.  Patient was instructed to hold Augmentin antibiotic for the next 3-4 days.  If symptoms persist or worsen she can start antibiotic.  I would like for her to continue her antihistamine and Flonase.  Encouraged her to do lots of nasal sinus rinses.  Follow-up as needed.

## 2017-06-18 NOTE — Patient Instructions (Signed)

## 2017-06-20 ENCOUNTER — Other Ambulatory Visit: Payer: Self-pay | Admitting: Physician Assistant

## 2017-06-20 DIAGNOSIS — E039 Hypothyroidism, unspecified: Secondary | ICD-10-CM

## 2017-06-26 ENCOUNTER — Ambulatory Visit: Payer: BLUE CROSS/BLUE SHIELD | Admitting: Family Medicine

## 2017-06-26 DIAGNOSIS — Z0189 Encounter for other specified special examinations: Secondary | ICD-10-CM

## 2017-06-26 NOTE — Progress Notes (Deleted)
   Subjective:    Patient ID: Melissa Sharp, female    DOB: 05/04/1980, 37 y.o.   MRN: [id number]  HPI [age] year old female who was just seen about a week ago for a sinus infection placed on Augmentin comes in today complaining of     Review of Systems     Objective:   Physical Exam  Constitutional: She is oriented to person, place, and time. She appears well-developed and well-nourished.  HENT:  Head: Normocephalic and atraumatic.  Right Ear: External ear normal.  Left Ear: External ear normal.  Nose: Nose normal.  Mouth/Throat: Oropharynx is clear and moist.  TMs and canals are clear.   Eyes: Pupils are equal, round, and reactive to light. Conjunctivae and EOM are normal.  Neck: Neck supple. No thyromegaly present.  Cardiovascular: Normal rate, regular rhythm and normal heart sounds.  Pulmonary/Chest: Effort normal and breath sounds normal. She has no wheezes.  Lymphadenopathy:    She has no cervical adenopathy.  Neurological: She is alert and oriented to person, place, and time.  Skin: Skin is warm and dry.  Psychiatric: She has a normal mood and affect.          Assessment & Plan:

## 2017-06-30 ENCOUNTER — Other Ambulatory Visit: Payer: Self-pay | Admitting: Physician Assistant

## 2017-06-30 DIAGNOSIS — E781 Pure hyperglyceridemia: Secondary | ICD-10-CM

## 2017-06-30 DIAGNOSIS — R748 Abnormal levels of other serum enzymes: Secondary | ICD-10-CM

## 2017-06-30 DIAGNOSIS — E039 Hypothyroidism, unspecified: Secondary | ICD-10-CM

## 2017-06-30 NOTE — Progress Notes (Signed)
Old lab orders expired, labs reordered.

## 2017-07-01 ENCOUNTER — Ambulatory Visit: Payer: BLUE CROSS/BLUE SHIELD | Admitting: Physician Assistant

## 2017-07-01 ENCOUNTER — Encounter: Payer: Self-pay | Admitting: Physician Assistant

## 2017-07-01 VITALS — BP 138/94 | HR 102 | Ht 63.0 in | Wt 138.0 lb

## 2017-07-01 DIAGNOSIS — R110 Nausea: Secondary | ICD-10-CM | POA: Diagnosis not present

## 2017-07-01 DIAGNOSIS — R198 Other specified symptoms and signs involving the digestive system and abdomen: Secondary | ICD-10-CM | POA: Diagnosis not present

## 2017-07-01 DIAGNOSIS — R140 Abdominal distension (gaseous): Secondary | ICD-10-CM | POA: Diagnosis not present

## 2017-07-01 DIAGNOSIS — K591 Functional diarrhea: Secondary | ICD-10-CM | POA: Diagnosis not present

## 2017-07-01 DIAGNOSIS — M6208 Separation of muscle (nontraumatic), other site: Secondary | ICD-10-CM

## 2017-07-01 DIAGNOSIS — K580 Irritable bowel syndrome with diarrhea: Secondary | ICD-10-CM

## 2017-07-01 LAB — POCT URINE PREGNANCY: Preg Test, Ur: NEGATIVE

## 2017-07-01 MED ORDER — HYOSCYAMINE SULFATE ER 0.375 MG PO TB12: 0.3750 mg | ORAL_TABLET | Freq: Two times a day (BID) | ORAL | 0 refills | Status: DC

## 2017-07-01 NOTE — Progress Notes (Signed)
Subjective:    Patient ID: Melissa Sharp, female    DOB: 11/13/1980, 37 y.o.   MRN: [id number]  HPI [name] presents today to discuss possible IBS symptoms. She states that she has been feeling really bloated and occasionally has episodes where she has to suddenly run to the bathroom with diarrhea. She states she has had colonoscopies in the past and was told she has "IBD" but she denies ever being told she has Crohn's or Ulcerative Colitis. She denies any bloody stools. At times she is nauseated but not vomiting other than when she had the stomach bug recently.   She states that she has been getting extremely bloated after some meals, but she is not sure if any certain foods or food types cause her symptoms (She showed picture of her belly bloated)  .Marland Kitchen Active Ambulatory Problems    Diagnosis Date Noted  . IBS (irritable bowel syndrome) 10/27/2013  . Thyroid activity decreased 10/27/2013  . Migraine without aura, without mention of intractable migraine without mention of status migrainosus 10/29/2013  . Esophageal reflux 10/29/2013  . Depression, major, recurrent (HCC) 10/29/2013  . Mixed anxiety depressive disorder 10/29/2013  . B12 deficiency 10/29/2013  . Hypertriglyceridemia 10/29/2013  . Elevated liver enzymes 11/02/2013  . Poor venous access 01/24/2015  . Post concussive syndrome 05/21/2015  . Cervical pain (neck) 06/26/2015  . Intractable migraine 06/30/2015  . Migraine with status migrainosus 07/15/2015  . PIH (pregnancy induced hypertension) 07/28/2015  . Diastasis recti 02/20/2016  . Low back pain with radiation, unspecified laterality 02/23/2016  . Overweight (BMI 25.0-29.9) 03/20/2016  . Elevated blood pressure reading 05/03/2016  . Tachycardia with heart rate 100-120 beats per minute 02/25/2017  . Tapeworm infection 02/25/2017  . Non-seasonal allergic rhinitis 05/08/2017  . Anxiety 05/08/2017  . Functional diarrhea 07/01/2017  . Right upper quadrant guarding  07/01/2017  . Bloating 07/01/2017  . Nausea 07/01/2017   Resolved Ambulatory Problems    Diagnosis Date Noted  . Pregnancy 01/24/2015  . Viral gastroenteritis 01/24/2015  . Nausea and vomiting during pregnancy 03/07/2015  . Dizziness 03/07/2015  . Dehydration 03/07/2015  . Musculoskeletal neck pain 06/26/2015   Past Medical History:  Diagnosis Date  . Anxiety   . Depression   . Hypothyroidism   . Migraine       Review of Systems  Constitutional: Negative for chills, fatigue, fever and unexpected weight change.  Respiratory: Negative for cough and shortness of breath.   Cardiovascular: Negative for chest pain and palpitations.  Gastrointestinal: Positive for abdominal distention. Negative for blood in stool, constipation, diarrhea, nausea and vomiting.  Neurological: Negative for dizziness, seizures and headaches.       Objective:   Physical Exam  Constitutional: She is oriented to person, place, and time. She appears well-developed and well-nourished. No distress.  HENT:  Head: Normocephalic and atraumatic.  Eyes: Conjunctivae are normal.  Neck: Normal range of motion.  Cardiovascular: Normal rate, regular rhythm, normal heart sounds and intact distal pulses.  Pulmonary/Chest: Effort normal and breath sounds normal.  Abdominal: Soft. Bowel sounds are normal. There is tenderness. There is guarding ( RUQ).  Musculoskeletal: Normal range of motion.  Neurological: She is alert and oriented to person, place, and time.  Skin: Skin is warm and dry.  Psychiatric: She has a normal mood and affect. Her behavior is normal.          Assessment & Plan:  Marland KitchenMarland KitchenDiagnoses and all orders for this visit:  Irritable bowel syndrome with diarrhea -  hyoscyamine (LEVBID) 0.375 MG 12 hr tablet; Take 1 tablet (0.375 mg total) by mouth 2 (two) times daily.  Nausea -     POCT urine pregnancy -     US Abdomen Complete -     TSH -     Food Specific IgG Allergy( Adult)  Bloating -      US Abdomen Complete -     TSH -     Food Specific IgG Allergy( Adult)  Right upper quadrant guarding -     US Abdomen Complete -     TSH -     Food Specific IgG Allergy( Adult)  Functional diarrhea -     hyoscyamine (LEVBID) 0.375 MG 12 hr tablet; Take 1 tablet (0.375 mg total) by mouth 2 (two) times daily. -     US Abdomen Complete -     TSH -     Food Specific IgG Allergy( Adult)  Diastasis recti   On exam today it was concerning that she had some guarding in her right upper quadrant to palpation.  She is not overtly tender in this area.  She is having some diarrhea, nausea and bloating.  I would like to get an abdominal ultrasound to review her gallbladder.  Her symptoms are consistent with some irritable bowel syndrome.  They do seem to relieved with bowel movements.  I would like to try an antispasmodic as well as encourage patient to start a probiotic.  I think it would be very helpful if she started a food diary to look for triggers.  I will also order a Ig G allergy panel for food.    Patient was also concerned with her bulging mass in her abdominal suspect a small area discussed this is fairly normal after pregnancy.  This is evidence of her abdominal muscles stretching apart.  She can work on core exercises to help strengthen this.  Do not suggest to do any major intervention until she is done having children.  Follow-up in 4-6 weeks to see how she is doing.  Marland Kitchen..Spent 30 minutes with patient and greater than 50 percent of visit spent counseling patient regarding treatment plan.

## 2017-07-02 DIAGNOSIS — E039 Hypothyroidism, unspecified: Secondary | ICD-10-CM | POA: Diagnosis not present

## 2017-07-02 DIAGNOSIS — R198 Other specified symptoms and signs involving the digestive system and abdomen: Secondary | ICD-10-CM | POA: Diagnosis not present

## 2017-07-02 DIAGNOSIS — K591 Functional diarrhea: Secondary | ICD-10-CM | POA: Diagnosis not present

## 2017-07-02 DIAGNOSIS — R140 Abdominal distension (gaseous): Secondary | ICD-10-CM | POA: Diagnosis not present

## 2017-07-02 DIAGNOSIS — R110 Nausea: Secondary | ICD-10-CM | POA: Diagnosis not present

## 2017-07-03 ENCOUNTER — Telehealth: Payer: Self-pay

## 2017-07-03 DIAGNOSIS — E039 Hypothyroidism, unspecified: Secondary | ICD-10-CM

## 2017-07-03 MED ORDER — LEVOTHYROXINE SODIUM 50 MCG PO TABS: 50.0000 ug | ORAL_TABLET | Freq: Every day | ORAL | 3 refills | Status: DC

## 2017-07-03 NOTE — Telephone Encounter (Signed)
[name] called for lab results on the TSH. She wanted a refill on Levothyroxine. I did refill the levothyroxine.

## 2017-07-07 ENCOUNTER — Ambulatory Visit (INDEPENDENT_AMBULATORY_CARE_PROVIDER_SITE_OTHER): Payer: BLUE CROSS/BLUE SHIELD

## 2017-07-07 DIAGNOSIS — R1011 Right upper quadrant pain: Secondary | ICD-10-CM

## 2017-07-07 DIAGNOSIS — E781 Pure hyperglyceridemia: Secondary | ICD-10-CM | POA: Diagnosis not present

## 2017-07-07 DIAGNOSIS — R748 Abnormal levels of other serum enzymes: Secondary | ICD-10-CM | POA: Diagnosis not present

## 2017-07-07 LAB — COMPLETE METABOLIC PANEL WITH GFR
AG RATIO: 2 (calc) (ref 1.0–2.5)
ALBUMIN MSPROF: 4.8 g/dL (ref 3.6–5.1)
ALT: 33 U/L — AB (ref 6–29)
AST: 29 U/L (ref 10–30)
Alkaline phosphatase (APISO): 78 U/L (ref 33–115)
BILIRUBIN TOTAL: 0.7 mg/dL (ref 0.2–1.2)
BUN: 9 mg/dL (ref 7–25)
CHLORIDE: 105 mmol/L (ref 98–110)
CO2: 25 mmol/L (ref 20–32)
Calcium: 9.4 mg/dL (ref 8.6–10.2)
Creat: 0.77 mg/dL (ref 0.50–1.10)
GFR, EST AFRICAN AMERICAN: 115 mL/min/{1.73_m2} (ref >=60)
GFR, EST NON AFRICAN AMERICAN: 99 mL/min/{1.73_m2} (ref >=60)
Globulin: 2.4 g/dL (calc) (ref 1.9–3.7)
Glucose, Bld: 96 mg/dL (ref 65–99)
POTASSIUM: 4.1 mmol/L (ref 3.5–5.3)
Sodium: 138 mmol/L (ref 135–146)
TOTAL PROTEIN: 7.2 g/dL (ref 6.1–8.1)

## 2017-07-07 LAB — LIPID PANEL
Cholesterol: 243 mg/dL — ABNORMAL HIGH (ref <200)
HDL: 73 mg/dL (ref >50)
LDL CHOLESTEROL (CALC): 115 mg/dL — AB
NON-HDL CHOLESTEROL (CALC): 170 mg/dL — AB (ref <130)
TRIGLYCERIDES: 387 mg/dL — AB (ref <150)
Total CHOL/HDL Ratio: 3.3 (calc) (ref <5.0)

## 2017-07-08 LAB — FOOD SPECIFIC IGG ALLERGY( ADULT)
CASEIN IGE: 2.8 ug/mL — AB (ref <2.0)
COFFEE (F221) IGG: 2.5 ug/mL — ABNORMAL HIGH (ref <2.0)
Cacao (Chocolate)IgG: 2.5 ug/mL — ABNORMAL HIGH (ref <2.0)
Codfish/Scrod IgG: <2 ug/mL (ref <2.0)
Corn IgG: 3.9 ug/mL — ABNORMAL HIGH (ref <2.0)
Egg white, IgG: 3.9 ug/mL — ABNORMAL HIGH (ref <2.0)
Soybean IgG: <2 ug/mL (ref <2.0)
Tomato IgG: 2.8 ug/mL — ABNORMAL HIGH (ref <2.0)
WHEAT IGG: 6.1 ug/mL — AB (ref <2.0)
YEAST (F45) IGG: 2.4 ug/mL — ABNORMAL HIGH (ref <2.0)

## 2017-07-08 LAB — TSH: TSH: 1.22 mIU/L

## 2017-07-09 NOTE — Progress Notes (Signed)
Liver enzyme continues to be mildly increased. Likely due to fatty liver disease Total cholesterol is high, but mostly unchaned from 1 year ago. Triglycerides are much better. Continue Vascepa Recommend Mediterranean diet, regular aerobic exercise 150 minutes per week, liimiting alcohol to no more than 1 standard drink per day, and weight loss

## 2017-07-09 NOTE — Progress Notes (Signed)
TSH is at goal. Continue Synthroid at current dose Food allergy panel is unremarkable. Possible mild sensitivity to wheat

## 2017-09-01 ENCOUNTER — Ambulatory Visit: Payer: BLUE CROSS/BLUE SHIELD | Admitting: Physician Assistant

## 2017-09-01 ENCOUNTER — Telehealth: Payer: Self-pay

## 2017-09-01 NOTE — Telephone Encounter (Signed)
Melissa CruiseKristin would like Amber to give her a call back.

## 2017-09-04 NOTE — Telephone Encounter (Signed)
LMOM for pt to return call. 

## 2017-09-05 NOTE — Telephone Encounter (Addendum)
Patient is coming in this afternoon to pick up 6 Vascepa samples per PCP.   [id number]#[id number] Exp: 04/2020.

## 2017-09-06 DIAGNOSIS — F411 Generalized anxiety disorder: Secondary | ICD-10-CM | POA: Diagnosis not present

## 2017-09-06 DIAGNOSIS — F3342 Major depressive disorder, recurrent, in full remission: Secondary | ICD-10-CM | POA: Diagnosis not present

## 2017-09-17 ENCOUNTER — Telehealth (HOSPITAL_COMMUNITY): Payer: Self-pay | Admitting: Psychology

## 2017-09-23 ENCOUNTER — Ambulatory Visit (HOSPITAL_COMMUNITY): Payer: BLUE CROSS/BLUE SHIELD | Admitting: Psychology

## 2017-12-22 DIAGNOSIS — Z01419 Encounter for gynecological examination (general) (routine) without abnormal findings: Secondary | ICD-10-CM | POA: Diagnosis not present

## 2017-12-22 DIAGNOSIS — Z6824 Body mass index (BMI) 24.0-24.9, adult: Secondary | ICD-10-CM | POA: Diagnosis not present

## 2018-01-19 ENCOUNTER — Telehealth: Payer: Self-pay

## 2018-01-19 NOTE — Telephone Encounter (Signed)
Ok call chris with [employer](the drug rep) and see if he can get [place] some samples.

## 2018-01-19 NOTE — Telephone Encounter (Signed)
Melissa Sharp called today saying that she needs for Vasepa samples. She said you told her to contact us for more samples when she ran out, but I just wanted to make sure with you first. Thanks!

## 2018-01-22 NOTE — Telephone Encounter (Signed)
Drug representative has been called to get samples.

## 2018-01-28 ENCOUNTER — Encounter: Payer: Self-pay | Admitting: Physician Assistant

## 2018-01-28 ENCOUNTER — Ambulatory Visit (INDEPENDENT_AMBULATORY_CARE_PROVIDER_SITE_OTHER): Payer: BLUE CROSS/BLUE SHIELD | Admitting: Physician Assistant

## 2018-01-28 VITALS — BP 131/92 | HR 102 | Temp 98.2°F | Ht 63.0 in | Wt 138.0 lb

## 2018-01-28 DIAGNOSIS — E781 Pure hyperglyceridemia: Secondary | ICD-10-CM

## 2018-01-28 DIAGNOSIS — J0100 Acute maxillary sinusitis, unspecified: Secondary | ICD-10-CM

## 2018-01-28 MED ORDER — ICOSAPENT ETHYL 1 G PO CAPS: ORAL_CAPSULE | ORAL | 11 refills | Status: DC

## 2018-01-28 MED ORDER — METHYLPREDNISOLONE SODIUM SUCC 125 MG IJ SOLR
125.0000 mg | Freq: Once | INTRAMUSCULAR | Status: AC
  Administered 2018-01-28: 125 mg via INTRAMUSCULAR

## 2018-01-28 MED ORDER — AMOXICILLIN-POT CLAVULANATE 875-125 MG PO TABS: 1.0000 | ORAL_TABLET | Freq: Two times a day (BID) | ORAL | 0 refills | Status: DC

## 2018-01-28 NOTE — Progress Notes (Signed)
Subjective:    Patient ID: Melissa Sharp, female    DOB: 11/30/1980, 37 y.o.   MRN: [id number]  HPI  Pt is a 37 yo female with hypertriglyceridemia who presents to the clinic with over a week of sinus pressure, headaches, ear pressure, green rhinorrhea. She has tried OTC mucinex and flonase. She has not ran a fever. She has a hx of sinus infections a few times a year. She is doing some nettie sinus rinses that help some. No SOB, wheezing. Some dry cough.   Her insurance will not pay for vascepa. She needs other alternatives.   .. Active Ambulatory Problems    Diagnosis Date Noted  . IBS (irritable bowel syndrome) 10/27/2013  . Thyroid activity decreased 10/27/2013  . Migraine without aura, without mention of intractable migraine without mention of status migrainosus 10/29/2013  . Esophageal reflux 10/29/2013  . Depression, major, recurrent (HCC) 10/29/2013  . Mixed anxiety depressive disorder 10/29/2013  . B12 deficiency 10/29/2013  . Hypertriglyceridemia 10/29/2013  . Elevated liver enzymes 11/02/2013  . Poor venous access 01/24/2015  . Post concussive syndrome 05/21/2015  . Cervical pain (neck) 06/26/2015  . Intractable migraine 06/30/2015  . Migraine with status migrainosus 07/15/2015  . PIH (pregnancy induced hypertension) 07/28/2015  . Diastasis recti 02/20/2016  . Low back pain with radiation, unspecified laterality 02/23/2016  . Overweight (BMI 25.0-29.9) 03/20/2016  . Elevated blood pressure reading 05/03/2016  . Tachycardia with heart rate 100-120 beats per minute 02/25/2017  . Tapeworm infection 02/25/2017  . Non-seasonal allergic rhinitis 05/08/2017  . Anxiety 05/08/2017  . Functional diarrhea 07/01/2017  . Right upper quadrant guarding 07/01/2017  . Bloating 07/01/2017  . Nausea 07/01/2017   Resolved Ambulatory Problems    Diagnosis Date Noted  . Pregnancy 01/24/2015  . Viral gastroenteritis 01/24/2015  . Nausea and vomiting during pregnancy 03/07/2015  .  Dizziness 03/07/2015  . Dehydration 03/07/2015  . Musculoskeletal neck pain 06/26/2015   Past Medical History:  Diagnosis Date  . Depression   . Hypothyroidism   . Migraine       Review of Systems See HPI.     Objective:   Physical Exam  Constitutional: She is oriented to person, place, and time. She appears well-developed and well-nourished.  HENT:  Head: Normocephalic and atraumatic.  Right Ear: External ear normal.  Left Ear: External ear normal.  TM's erythematous with some mild effusions.  Oropharynx erythematous with PND. No tonsil swelling or exudate.  Bilateral nasal turbinates red and swollen.  Tenderness over maxillary sinuses to palpation.   Eyes: Conjunctivae are normal.  Cardiovascular: Normal rate and regular rhythm.  Pulmonary/Chest: Effort normal and breath sounds normal. She has no wheezes.  Lymphadenopathy:    She has cervical adenopathy.  Neurological: She is alert and oriented to person, place, and time.  Skin:  Flushed red cheeks.   Psychiatric: She has a normal mood and affect. Her behavior is normal.          Assessment & Plan:  Marland Kitchen.Marland Kitchen.Diagnoses and all orders for this visit:  Acute non-recurrent maxillary sinusitis -     amoxicillin-clavulanate (AUGMENTIN) 875-125 MG tablet; Take 1 tablet by mouth 2 (two) times daily. -     methylPREDNISolone sodium succinate (SOLU-MEDROL) 125 mg/2 mL injection 125 mg  Hypertriglyceridemia -     Icosapent Ethyl 1 g CAPS; Take 2 tablets twice a day. -     omega-3 acid ethyl esters (LOVAZA) 1 g capsule; Take 2 capsules (2 g total) by mouth  2 (two) times daily.   Over a week of symptoms with hx of sinus infection. Sent augmentin. Given shot of solumedrol for inflammation. HO given. Discussed symptomatic care.   vascepa sent first. Too expensive. lovaza sent next. If continues to be too expensive will have to do OTC fish oil.

## 2018-01-28 NOTE — Patient Instructions (Signed)

## 2018-01-29 ENCOUNTER — Telehealth: Payer: Self-pay

## 2018-01-29 ENCOUNTER — Encounter: Payer: Self-pay | Admitting: Physician Assistant

## 2018-01-29 MED ORDER — OMEGA-3-ACID ETHYL ESTERS 1 G PO CAPS: 2.0000 g | ORAL_CAPSULE | Freq: Two times a day (BID) | ORAL | 3 refills | Status: DC

## 2018-01-29 NOTE — Telephone Encounter (Signed)
I sent over lovaza to try and see if affordable. If NOT then will have to go back to OTC fish oil 4000mg  a day.

## 2018-01-29 NOTE — Telephone Encounter (Signed)
Per Ray[place] Citizenaroline her company has placed a freeze on samples at this time. She will let us know at a later date if she is able to get samples. Patient was made aware at her appointment.

## 2018-01-29 NOTE — Telephone Encounter (Signed)
Melissa CruiseKristin called today and said her insurance will still not cover Vascepa. She was wanting to know if there is any alternative medication we could prescribe for her? She was also saying that since taking the Vascepa, she stopped taking fish oil, and was wondering if she should start taking it again? Please advise.

## 2018-02-02 NOTE — Telephone Encounter (Signed)
Called and notified patient. She will try to get the lovaza, but if it is not covered by insurance, then she will go ahead and take the OTC fish oil. No further questions or concerns at this time.

## 2018-03-09 ENCOUNTER — Other Ambulatory Visit: Payer: Self-pay

## 2018-03-09 MED ORDER — PIMECROLIMUS 1 % EX CREA: TOPICAL_CREAM | Freq: Two times a day (BID) | CUTANEOUS | 0 refills | Status: DC

## 2018-03-13 ENCOUNTER — Encounter: Payer: Self-pay | Admitting: Physician Assistant

## 2018-03-13 ENCOUNTER — Ambulatory Visit (INDEPENDENT_AMBULATORY_CARE_PROVIDER_SITE_OTHER): Payer: BLUE CROSS/BLUE SHIELD | Admitting: Physician Assistant

## 2018-03-13 VITALS — BP 144/101 | HR 84 | Wt 141.0 lb

## 2018-03-13 DIAGNOSIS — I10 Essential (primary) hypertension: Secondary | ICD-10-CM

## 2018-03-13 DIAGNOSIS — M6281 Muscle weakness (generalized): Secondary | ICD-10-CM

## 2018-03-13 DIAGNOSIS — M5412 Radiculopathy, cervical region: Secondary | ICD-10-CM

## 2018-03-13 DIAGNOSIS — B9689 Other specified bacterial agents as the cause of diseases classified elsewhere: Secondary | ICD-10-CM

## 2018-03-13 DIAGNOSIS — J019 Acute sinusitis, unspecified: Secondary | ICD-10-CM

## 2018-03-13 MED ORDER — METHYLPREDNISOLONE SODIUM SUCC 125 MG IJ SOLR
125.0000 mg | Freq: Once | INTRAMUSCULAR | Status: AC
  Administered 2018-03-13: 125 mg via INTRAMUSCULAR

## 2018-03-13 MED ORDER — DOXYCYCLINE HYCLATE 100 MG PO TABS: 100.0000 mg | ORAL_TABLET | Freq: Two times a day (BID) | ORAL | 0 refills | Status: AC

## 2018-03-13 MED ORDER — METHYLPREDNISOLONE ACETATE 80 MG/ML IJ SUSP
80.0000 mg | Freq: Once | INTRAMUSCULAR | Status: AC
  Administered 2018-03-13: 80 mg via INTRAMUSCULAR

## 2018-03-13 MED ORDER — LABETALOL HCL 100 MG PO TABS: 100.0000 mg | ORAL_TABLET | Freq: Two times a day (BID) | ORAL | 1 refills | Status: DC

## 2018-03-13 MED ORDER — TRAMADOL HCL 50 MG PO TABS: 50.0000 mg | ORAL_TABLET | Freq: Four times a day (QID) | ORAL | 0 refills | Status: AC | PRN

## 2018-03-13 MED ORDER — METHOCARBAMOL 500 MG PO TABS: 500.0000 mg | ORAL_TABLET | Freq: Three times a day (TID) | ORAL | 0 refills | Status: DC | PRN

## 2018-03-13 NOTE — Patient Instructions (Signed)
Cervical Radiculopathy    Cervical radiculopathy happens when a nerve in the neck (cervical nerve) is pinched or bruised. This condition can develop because of an injury or as part of the normal aging process. Pressure on the cervical nerves can cause pain or numbness that runs from the neck all the way down into the arm and fingers. Usually, this condition gets better with rest. Treatment may be needed if the condition does not improve.  What are the causes?  This condition may be caused by:   Injury.   Slipped (herniated) disk.   Muscle tightness in the neck because of overuse.   Arthritis.   Breakdown or degeneration in the bones and joints of the spine (spondylosis) due to aging.   Bone spurs that may develop near the cervical nerves.  What are the signs or symptoms?  Symptoms of this condition include:   Pain that runs from the neck to the arm and hand. The pain can be severe or irritating. It may be worse when the neck is moved.   Numbness or weakness in the affected arm and hand.  How is this diagnosed?  This condition may be diagnosed based on symptoms, medical history, and a physical exam. You may also have tests, including:   X-rays.   CT scan.   MRI.   Electromyogram (EMG).   Nerve conduction tests.  How is this treated?  In many cases, treatment is not needed for this condition. With rest, the condition usually gets better over time. If treatment is needed, options may include:   Wearing a soft neck collar for short periods of time.   Physical therapy to strengthen your neck muscles.   Medicines, such as NSAIDs, oral corticosteroids, or spinal injections.   Surgery. This may be needed if other treatments do not help. Various types of surgery may be done depending on the cause of your problems.  Follow these instructions at home:  Managing pain   Take over-the-counter and prescription medicines only as told by your health care provider.   If directed, apply ice to the affected  area.  ? Put ice in a plastic bag.  ? Place a towel between your skin and the bag.  ? Leave the ice on for 20 minutes, 2-3 times per day.   If ice does not help, you can try using heat. Take a warm shower or warm bath, or use a heat pack as told by your health care provider.   Try a gentle neck and shoulder massage to help relieve symptoms.  Activity   Rest as needed. Follow instructions from your health care provider about any restrictions on activities.   Do stretching and strengthening exercises as told by your health care provider or physical therapist.  General instructions   If you were given a soft collar, wear it as told by your health care provider.   Use a flat pillow when you sleep.   Keep all follow-up visits as told by your health care provider. This is important.  Contact a health care provider if:   Your condition does not improve with treatment.  Get help right away if:   Your pain gets much worse and cannot be controlled with medicines.   You have weakness or numbness in your hand, arm, face, or leg.   You have a high fever.   You have a stiff, rigid neck.   You lose control of your bowels or your bladder (have incontinence).   You   have trouble with walking, balance, or speaking.  This information is not intended to replace advice given to you by your health care provider. Make sure you discuss any questions you have with your health care provider.  Document Released: 11/27/2000 Document Revised: 08/10/2015 Document Reviewed: 04/28/2014  Elsevier Interactive Patient Education  2019 [employer]

## 2018-03-13 NOTE — Progress Notes (Signed)
HPI:                                                                Melissa GavelKristin Sharp is a 37 y.o. female who [facility]: [facility] today for back pain and ?sinusitis  Sinus Problem  This is a new problem. The current episode started 1 to 4 weeks ago (x 10 days). The problem is unchanged. There has been no fever. Associated symptoms include chills, congestion, neck pain, sinus pressure and swollen glands. Pertinent negatives include no coughing, ear pain or headaches. Past treatments include saline sprays, oral decongestants and acetaminophen. The treatment provided mild relief.  Neck Pain   This is a new problem. The current episode started in the past 7 days (x 5 days). The problem occurs constantly. The problem has been gradually worsening. The pain is associated with a remote injury. The pain is present in the left side. The pain is severe. The pain is same all the time. Associated symptoms include numbness (left arm) and weakness (left grip). Pertinent negatives include no fever, headaches, pain with swallowing or trouble swallowing. She has tried acetaminophen and NSAIDs for the symptoms. The treatment provided no relief.    Past Medical History:  Diagnosis Date  . Anxiety   . Depression   . Hypothyroidism   . Migraine   . Preeclampsia    Past Surgical History:  Procedure Laterality Date  . TONSILECTOMY/ADENOIDECTOMY WITH MYRINGOTOMY     Social History   Tobacco Use  . Smoking status: Never Smoker  . Smokeless tobacco: Never Used  Substance Use Topics  . Alcohol use: Yes    Comment: 5 drinks a week   family history includes Alcohol abuse in her father; Atrial fibrillation in her mother; Hyperlipidemia in an other family member.    ROS: negative except as noted in the HPI  Medications: Current Outpatient Medications  Medication Sig Dispense Refill  . ALPRAZolam (XANAX) 0.25 MG tablet Take 0.25 mg by mouth at bedtime as  needed.    Marland Kitchen. buPROPion (WELLBUTRIN XL) 150 MG 24 hr tablet Take 150 mg by mouth.    Marland Kitchen. FLUoxetine (PROZAC) 20 MG capsule Take 60 mg by mouth daily.   1  . fluticasone (FLONASE) 50 MCG/ACT nasal spray Place 2 sprays into both nostrils daily. 16 g 6  . hyoscyamine (LEVBID) 0.375 MG 12 hr tablet Take 1 tablet (0.375 mg total) by mouth 2 (two) times daily. 60 tablet 0  . Icosapent Ethyl 1 g CAPS Take 2 tablets twice a day. 120 capsule 11  . levothyroxine (SYNTHROID, LEVOTHROID) 50 MCG tablet Take 1 tablet (50 mcg total) by mouth daily before breakfast. 90 tablet 3  . omega-3 acid ethyl esters (LOVAZA) 1 g capsule Take 2 capsules (2 g total) by mouth 2 (two) times daily. 180 capsule 3  . pimecrolimus (ELIDEL) 1 % cream Apply topically 2 (two) times daily. 30 g 0  . doxycycline (VIBRA-TABS) 100 MG tablet Take 1 tablet (100 mg total) by mouth 2 (two) times daily for 7 days. 14 tablet 0  . labetalol (NORMODYNE) 100 MG tablet Take 1 tablet (100 mg total) by mouth 2 (two) times daily. 60 tablet 1  . methocarbamol (ROBAXIN) 500 MG tablet Take  1 tablet (500 mg total) by mouth 3 (three) times daily as needed for muscle spasms. 30 tablet 0  . traMADol (ULTRAM) 50 MG tablet Take 1 tablet (50 mg total) by mouth every 6 (six) hours as needed for up to 5 days for moderate pain or severe pain. Maximum 6 tabs per day. 20 tablet 0   No current facility-administered medications for this visit.    Allergies  Allergen Reactions  . Hydrocodone-Acetaminophen Itching  . Shellfish Allergy Anaphylaxis  . Latex Hives       Objective:  BP (!) 144/101   Pulse 84   Wt 141 lb (64 kg)   LMP 03/05/2018 (Exact Date)   Breastfeeding No   BMI 24.98 kg/m  Gen:  alert, not ill-appearing, no distress, appropriate for age HEENT: head normocephalic without obvious abnormality, conjunctiva and cornea clear, wearing glasses, nasal mucosa edematous, bilateral maxillary sinus tenderness, oropharynx clear, moist mucous membranes,  neck supple, no cervical adenopathy,  trachea midline Pulm: Normal work of breathing, normal phonation, clear to auscultation bilaterally, no wheezes, rales or rhonchi CV: Normal rate, regular rhythm, s1 and s2 distinct, no murmurs, clicks or rubs  Neuro: alert and oriented x 3, no tremor Neck: atraumatic, no midline tenderness, left periscapular tenderness, severely decreased ROM in all directions, left upper extremity strength 4-/5, right upper extremity strength 5/5 MSK: extremities atraumatic, normal gait and station Skin: intact, no rashes on exposed skin, no jaundice, no cyanosis Psych: well-groomed, cooperative, good eye contact, euthymic mood, affect mood-congruent, speech is articulate, and thought processes clear and goal-directed  BP Readings from Last 3 Encounters:  03/13/18 (!) 144/101  01/28/18 (!) 131/92  07/01/17 (!) 138/94     No results found for this or any previous visit (from the past 72 hour(s)). No results found.    Assessment and Plan: 37 y.o. female with   .Melissa Sharp was seen today for back pain and nasal congestion.  Diagnoses and all orders for this visit:  Cervical radiculopathy -     DG Cervical Spine Complete -     MR Cervical Spine Wo Contrast; Future -     traMADol (ULTRAM) 50 MG tablet; Take 1 tablet (50 mg total) by mouth every 6 (six) hours as needed for up to 5 days for moderate pain or severe pain. Maximum 6 tabs per day. -     Ambulatory referral to Physical Therapy -     methocarbamol (ROBAXIN) 500 MG tablet; Take 1 tablet (500 mg total) by mouth 3 (three) times daily as needed for muscle spasms. -     methylPREDNISolone acetate (DEPO-MEDROL) injection 80 mg -     methylPREDNISolone sodium succinate (SOLU-MEDROL) 125 mg/2 mL injection 125 mg  Muscle weakness of left upper extremity -     DG Cervical Spine Complete -     MR Cervical Spine Wo Contrast; Future -     methylPREDNISolone acetate (DEPO-MEDROL) injection 80 mg -      methylPREDNISolone sodium succinate (SOLU-MEDROL) 125 mg/2 mL injection 125 mg  Acute bacterial sinusitis -     doxycycline (VIBRA-TABS) 100 MG tablet; Take 1 tablet (100 mg total) by mouth 2 (two) times daily for 7 days.  Hypertension goal BP (blood pressure) < 130/80 -     labetalol (NORMODYNE) 100 MG tablet; Take 1 tablet (100 mg total) by mouth 2 (two) times daily.  Cervical radiculopathy, left Acute neck pain associated with progressive left upper extremity weakness and radicular symptoms. X-ray and MRI pending Patient  declined Prednisone burst, preferred IM injection. Solumedrol 125 and Depomedrol 80 mg given in office today Counseled to avoid NSAIDs Tramadol and Robaxin prn for pain/spasm Referral placed for PT Follow-up with Sports Medicine in 2 weeks  HTN Following IM steroid injections, patient had a presyncopal episode in the lobby. She became diaphoretic and lightheaded, but did not lose consciousness. Her BP was elevated at 144/101. In reviewing her in office BP's all readings have been out of range. She reports a history of pre-eclampsia. She is planning on achieving pregnancy soon. Starting Labetalol  Acute bacterial sinusitis - afebrile, no tachypnea, no tachycardia, no adventitious lung sounds - persistent symptoms with maxillary sinus tenderness for 10 days - treating empirically for bacterial sinusitis  Patient education and anticipatory guidance given Patient agrees with treatment plan Follow-up as needed if symptoms worsen or fail to improve  Levonne Hubertharley E. Cummings PA-C

## 2018-03-15 ENCOUNTER — Encounter: Payer: Self-pay | Admitting: Physician Assistant

## 2018-03-15 DIAGNOSIS — I10 Essential (primary) hypertension: Secondary | ICD-10-CM | POA: Insufficient documentation

## 2018-03-15 DIAGNOSIS — M5412 Radiculopathy, cervical region: Secondary | ICD-10-CM | POA: Insufficient documentation

## 2018-03-15 DIAGNOSIS — M6281 Muscle weakness (generalized): Secondary | ICD-10-CM | POA: Insufficient documentation

## 2018-03-26 DIAGNOSIS — Z79899 Other long term (current) drug therapy: Secondary | ICD-10-CM | POA: Diagnosis not present

## 2018-03-26 DIAGNOSIS — R479 Unspecified speech disturbances: Secondary | ICD-10-CM | POA: Diagnosis not present

## 2018-03-26 DIAGNOSIS — Z91040 Latex allergy status: Secondary | ICD-10-CM | POA: Diagnosis not present

## 2018-03-26 DIAGNOSIS — R202 Paresthesia of skin: Secondary | ICD-10-CM | POA: Diagnosis not present

## 2018-03-26 DIAGNOSIS — Z885 Allergy status to narcotic agent status: Secondary | ICD-10-CM | POA: Diagnosis not present

## 2018-03-26 DIAGNOSIS — R0789 Other chest pain: Secondary | ICD-10-CM | POA: Diagnosis not present

## 2018-03-26 DIAGNOSIS — F329 Major depressive disorder, single episode, unspecified: Secondary | ICD-10-CM | POA: Diagnosis not present

## 2018-03-26 DIAGNOSIS — I10 Essential (primary) hypertension: Secondary | ICD-10-CM | POA: Diagnosis not present

## 2018-03-26 DIAGNOSIS — R51 Headache: Secondary | ICD-10-CM | POA: Diagnosis not present

## 2018-03-26 DIAGNOSIS — R079 Chest pain, unspecified: Secondary | ICD-10-CM | POA: Diagnosis not present

## 2018-03-26 DIAGNOSIS — E039 Hypothyroidism, unspecified: Secondary | ICD-10-CM | POA: Diagnosis not present

## 2018-03-26 DIAGNOSIS — R200 Anesthesia of skin: Secondary | ICD-10-CM | POA: Diagnosis not present

## 2018-03-31 ENCOUNTER — Ambulatory Visit: Payer: BLUE CROSS/BLUE SHIELD | Admitting: Physician Assistant

## 2018-04-03 ENCOUNTER — Encounter: Payer: Self-pay | Admitting: Physician Assistant

## 2018-04-03 ENCOUNTER — Ambulatory Visit (INDEPENDENT_AMBULATORY_CARE_PROVIDER_SITE_OTHER): Payer: BLUE CROSS/BLUE SHIELD | Admitting: Physician Assistant

## 2018-04-03 VITALS — BP 136/86 | HR 71 | Ht 63.0 in | Wt 138.0 lb

## 2018-04-03 DIAGNOSIS — F418 Other specified anxiety disorders: Secondary | ICD-10-CM

## 2018-04-03 DIAGNOSIS — T887XXA Unspecified adverse effect of drug or medicament, initial encounter: Secondary | ICD-10-CM | POA: Diagnosis not present

## 2018-04-03 DIAGNOSIS — I10 Essential (primary) hypertension: Secondary | ICD-10-CM

## 2018-04-03 MED ORDER — METHYLDOPA 250 MG PO TABS: 250.0000 mg | ORAL_TABLET | Freq: Two times a day (BID) | ORAL | 1 refills | Status: DC

## 2018-04-03 NOTE — Patient Instructions (Addendum)
Methyldopa tablets What is this medicine? METHYLDOPA (meth ill DOE pa) is used to treat high blood pressure. This medicine may be used for other purposes; ask your health care provider or pharmacist if you have questions. COMMON BRAND NAME(S): Aldomet What should I tell my health care provider before I take this medicine? They need to know if you have any of these conditions: -anemia -kidney or liver disease -an unusual or allergic reaction to methyldopa, other medicines, foods, dyes, or preservatives -pregnant or trying to get pregnant -breast-feeding How should I use this medicine? Take this medicine by mouth with a glass of water. Follow the directions on the prescription label. Take your doses at regular intervals. Do not take your medicine more often than directed. Do not stop taking except on the advice of your doctor or health care professional. Talk to your pediatrician regarding the use of this medicine in children. Special care may be needed. Overdosage: If you think you have taken too much of this medicine contact a poison control center or emergency room at once. NOTE: This medicine is only for you. Do not share this medicine with others. What if I miss a dose? If you miss a dose, take it as soon as you can. If it is almost time for your next dose, take only that dose. Do not take double or extra doses. What may interact with this medicine? Do not take this medicine with any of the following medications: -MAOIs like Carbex, Eldepryl, Marplan, Nardil, and Parnate This medicine also may interact with the following medications: -iron salts -lithium -medicines for high blood pressure This list may not describe all possible interactions. Give your health care provider a list of all the medicines, herbs, non-prescription drugs, or dietary supplements you use. Also tell them if you smoke, drink alcohol, or use illegal drugs. Some items may interact with your medicine. What should I  watch for while using this medicine? Visit your doctor or health care professional for regular checks on your progress. Check your heart rate and blood pressure regularly. Ask your doctor or health care professional what your heart rate should be and when you should contact him or her. If you get a fever, especially in the first few months, call your doctor or health care professional. Do not treat yourself. You may get drowsy or dizzy. Do not drive, use machinery, or do anything that needs mental alertness until you know how this medicine affects you. To avoid dizzy or fainting spells, do not stand or sit up quickly, especially if you are an older person. Alcohol can make you more drowsy and dizzy. Avoid alcoholic drinks. Your mouth may get dry. Chewing sugarless gum or sucking hard candy, and drinking plenty of water may help. Contact your doctor if the problem does not go away or is severe. Iron can stop the absorption of this medicine. Do not take this medicine with iron preparations or multiple vitamins containing iron. If you have to take iron, make sure that there are at least 2 hours between iron and methyldopa doses. What side effects may I notice from receiving this medicine? Side effects that you should report to your doctor or health care professional as soon as possible: -allergic reactions like skin rash, itching or hives, swelling of the face, lips, or tongue -black, sore tongue -chest pain -dark yellow or brown urine -depression -difficulty sleeping, nightmares -fever (usually within the first 3 months of treatment) -slow heartbeat -stomach pain -swelling of the feet or  legs -unusually weak or tired -yellowing of the eyes or skin Side effects that usually do not require medical attention (report to your doctor or health care professional if they continue or are bothersome): -abnormal production of milk in females -breast enlargement in both males and females -change in sex drive  or performance -diarrhea -headache -nausea, vomiting -numbness or tingling in hands or feet This list may not describe all possible side effects. Call your doctor for medical advice about side effects. You may report side effects to FDA at 1-800-FDA-1088. Where should I keep my medicine? Keep out of the reach of children. Store at room temperature between 15 and 30 degrees C (59 and 86 degrees F). Keep container tightly closed. Throw away any unused medicine after the expiration date. NOTE: This sheet is a summary. It may not cover all possible information. If you have questions about this medicine, talk to your doctor, pharmacist, or health care provider.  2019 Elsevier/Gold Standard (2007-09-21 14:04:24)   Keep BP readings for 2 weeks. If above 130/90 then start methyldopa. Report back to me readings.

## 2018-04-03 NOTE — Progress Notes (Signed)
Subjective:    Patient ID: Melissa Sharp, female    DOB: 02-Oct-1980, 38 y.o.   MRN: [id number]  HPI Pt is a 38 yo female who is here to follow up after ER visit for neurological symptoms after starting labetalol.   See ER note: 38 year old female comes into the emergency room with multiple complaints of chest pain that woke her at 2 AM this morning along with a severe headache and left-sided numbness and tingling. Patient is also complaining of jerking movements and difficulty speaking. Patient denies visual difficulties she denies weakness. Patient states her headache is still there but much less worse and her chest pain is gone. She still has some subjective left-sided body numbness and tingling. She also finds it difficult to speak feels a little slow. Denies nausea vomiting diarrhea. She has had some sweating since this onset of symptoms. She was recently diagnosed with hypertension 2 weeks ago placed on labetalol by primary care doctor. She states since she started taking that medication she has felt weird and tingly all over. No other new medicines.  Pt had MRA and all labs and imaging were normal.   labetolol was stopped and all symptoms resolved and have not returned. They wanted her to follow up with neurology but she does not want too. She was given xanax to take up to 3 times a day as needed if some anxiety component to neuro symptoms.    She is checking her BP at home and getting btw 120-130/70-80. No CP.   She is trying to get pregnant.   Overall mood is good. She wants to make appt with psychiatry to discuss decreasing prozac dose.   .. Active Ambulatory Problems    Diagnosis Date Noted  . IBS (irritable bowel syndrome) 10/27/2013  . Thyroid activity decreased 10/27/2013  . Migraine without aura, without mention of intractable migraine without mention of status migrainosus 10/29/2013  . Esophageal reflux 10/29/2013  . Depression, major, recurrent (HCC) 10/29/2013  . Mixed  anxiety depressive disorder 10/29/2013  . B12 deficiency 10/29/2013  . Hypertriglyceridemia 10/29/2013  . Elevated liver enzymes 11/02/2013  . Poor venous access 01/24/2015  . Post concussive syndrome 05/21/2015  . Cervical pain (neck) 06/26/2015  . Intractable migraine 06/30/2015  . Migraine with status migrainosus 07/15/2015  . PIH (pregnancy induced hypertension) 07/28/2015  . Diastasis recti 02/20/2016  . Low back pain with radiation, unspecified laterality 02/23/2016  . Overweight (BMI 25.0-29.9) 03/20/2016  . Elevated blood pressure reading 05/03/2016  . Tachycardia with heart rate 100-120 beats per minute 02/25/2017  . Tapeworm infection 02/25/2017  . Non-seasonal allergic rhinitis 05/08/2017  . Anxiety 05/08/2017  . Functional diarrhea 07/01/2017  . Right upper quadrant guarding 07/01/2017  . Bloating 07/01/2017  . Nausea 07/01/2017  . Cervical radiculopathy 03/15/2018  . Muscle weakness of left upper extremity 03/15/2018  . Hypertension goal BP (blood pressure) < 130/80 03/15/2018   Resolved Ambulatory Problems    Diagnosis Date Noted  . Pregnancy 01/24/2015  . Viral gastroenteritis 01/24/2015  . Nausea and vomiting during pregnancy 03/07/2015  . Dizziness 03/07/2015  . Dehydration 03/07/2015  . Musculoskeletal neck pain 06/26/2015   Past Medical History:  Diagnosis Date  . Depression   . Hypothyroidism   . Migraine   . Preeclampsia       Review of Systems See HPI.     Objective:   Physical Exam Vitals signs reviewed.  Constitutional:      Appearance: Normal appearance.  HENT:  Head: Normocephalic and atraumatic.  Cardiovascular:     Rate and Rhythm: Normal rate and regular rhythm.     Pulses: Normal pulses.  Pulmonary:     Effort: Pulmonary effort is normal.     Breath sounds: Normal breath sounds.  Neurological:     General: No focal deficit present.     Mental Status: She is alert and oriented to person, place, and time.  Psychiatric:         Mood and Affect: Mood normal.        Behavior: Behavior normal.           Assessment & Plan:  Marland KitchenMarland KitchenFeyza was seen today for hospitalization follow-up.  Diagnoses and all orders for this visit:  Medication side effect  Hypertension goal BP (blood pressure) < 130/80 -     methyldopa (ALDOMET) 250 MG tablet; Take 1 tablet (250 mg total) by mouth 2 (two) times daily.  Mixed anxiety depressive disorder   All neurological symptoms resolved with D/C of labetolol. It was added to allergies.  No need to follow up with neurological unless becomes symptomatic again.   Pt feels like BP at home are better than office. Keep BP log for 2 weeks. Printed off new rx for BP. If logging greater than 130/90 I think you should start methyldopa. 1 month follow up if start methyldopa.   Follow up with psych via medication changes. I think you are doing well and not change anything.

## 2018-04-08 ENCOUNTER — Ambulatory Visit: Payer: BLUE CROSS/BLUE SHIELD | Admitting: Physician Assistant

## 2018-05-21 ENCOUNTER — Telehealth: Payer: Self-pay | Admitting: Physician Assistant

## 2018-05-21 NOTE — Telephone Encounter (Signed)
Pt called and states insurance will not cover vascepa, requesting alternative. Routing.

## 2018-05-22 ENCOUNTER — Ambulatory Visit: Payer: BLUE CROSS/BLUE SHIELD | Admitting: Sports Medicine

## 2018-05-22 ENCOUNTER — Encounter: Payer: Self-pay | Admitting: Sports Medicine

## 2018-05-22 DIAGNOSIS — R69 Illness, unspecified: Secondary | ICD-10-CM

## 2018-05-22 DIAGNOSIS — J111 Influenza due to unidentified influenza virus with other respiratory manifestations: Secondary | ICD-10-CM | POA: Insufficient documentation

## 2018-05-22 MED ORDER — OSELTAMIVIR PHOSPHATE 75 MG PO CAPS: 75.0000 mg | ORAL_CAPSULE | Freq: Two times a day (BID) | ORAL | 0 refills | Status: DC

## 2018-05-22 MED ORDER — HYDROCOD POLST-CPM POLST ER 10-8 MG/5ML PO SUER: 5.0000 mL | Freq: Two times a day (BID) | ORAL | 0 refills | Status: DC | PRN

## 2018-05-22 NOTE — Assessment & Plan Note (Signed)
Symptoms since Monday but severe symptoms since Thursday, for this reason I am going to add Tamiflu. Tussionex, over-the-counter DayQuil NyQuil. She does have a allergy listed to hydrocodone with itching but she tells me she has done Tussionex in the past without any problems.

## 2018-05-22 NOTE — Telephone Encounter (Signed)
I will submit this to insurance and I am waiting on a response.

## 2018-05-22 NOTE — Progress Notes (Signed)
Subjective:    CC: Feeling sick  HPI: Since Monday this pleasant 38 year old female has had a mild cough, however on Thursday she developed severe muscle aches, body aches, fevers, chills, coughing, sore throat and fatigue.  Symptoms are moderate, worsening.  I reviewed the past medical history, family history, social history, surgical history, and allergies today and no changes were needed.  Please see the problem list section below in epic for further details.  Past Medical History: Past Medical History:  Diagnosis Date  . Anxiety   . Depression   . Hypothyroidism   . Migraine   . Preeclampsia    Past Surgical History: Past Surgical History:  Procedure Laterality Date  . TONSILECTOMY/ADENOIDECTOMY WITH MYRINGOTOMY     Social History: Social History   Socioeconomic History  . Marital status: Married    Spouse name: Ivin Booty  . Number of children: 0  . Years of education: Not on file  . Highest education level: Not on file  Occupational History  . Occupation: Designer, television/film set  Social Needs  . Financial resource strain: Not on file  . Food insecurity:    Worry: Not on file    Inability: Not on file  . Transportation needs:    Medical: Not on file    Non-medical: Not on file  Tobacco Use  . Smoking status: Never Smoker  . Smokeless tobacco: Never Used  Substance and Sexual Activity  . Alcohol use: Yes    Comment: 5 drinks a week  . Drug use: No  . Sexual activity: Yes    Birth control/protection: None  Lifestyle  . Physical activity:    Days per week: Not on file    Minutes per session: Not on file  . Stress: Not on file  Relationships  . Social connections:    Talks on phone: Not on file    Gets together: Not on file    Attends religious service: Not on file    Active member of club or organization: Not on file    Attends meetings of clubs or organizations: Not on file    Relationship status: Not on file  Other Topics Concern  . Not on file  Social  History Narrative   Lives w/ husband   Family History: Family History  Problem Relation Age of Onset  . Alcohol abuse Father   . Atrial fibrillation Mother   . Hyperlipidemia Other   . Migraines Neg Hx    Allergies: Allergies  Allergen Reactions  . Hydrocodone-Acetaminophen Itching  . Labetalol     Numbness and tingling/jerking/aphasia  . Shellfish Allergy Anaphylaxis  . Latex Hives   Medications: See med rec.  Review of Systems: No fevers, chills, night sweats, weight loss, chest pain, or shortness of breath.   Objective:    General: Well Developed, well nourished, appears ill. Neuro: Alert and oriented x3, extra-ocular muscles intact, sensation grossly intact.  HEENT: Normocephalic, atraumatic, pupils equal round reactive to light, neck supple, no masses, no lymphadenopathy, thyroid nonpalpable.  Oropharynx, nasopharynx and ear canals unremarkable. Skin: Warm and dry, no rashes. Cardiac: Regular rate and rhythm, no murmurs rubs or gallops, no lower extremity edema.  Respiratory: Clear to auscultation bilaterally. Not using accessory muscles, speaking in full sentences.  Impression and Recommendations:    Influenza-like illness Symptoms since Monday but severe symptoms since Thursday, for this reason I am going to add Tamiflu. Tussionex, over-the-counter DayQuil NyQuil. She does have a allergy listed to hydrocodone with itching but she tells  me she has done Tussionex in the past without any problems. ___________________________________________ Ihor Austin. Benjamin Stain, M.D., ABFM., CAQSM. Primary Care and Sports Medicine [facility]  Adjunct Professor of Family Medicine  [employer]

## 2018-05-22 NOTE — Telephone Encounter (Signed)
HOlden,   HELP. Will they pay for LOVAZA? We have been around and around with them. She has pretty high TG. For now she can do Fish OIL 4000mg  while we work on coverage. We used to just keep her in samples but now Chris(livalo rep) does not have samples.

## 2018-05-25 NOTE — Telephone Encounter (Signed)
Per [employer] no PA is needed for Vascepa. Pharmacy has been notified and no further questions at that time.

## 2018-06-01 ENCOUNTER — Telehealth: Payer: Self-pay | Admitting: Physician Assistant

## 2018-06-01 DIAGNOSIS — J111 Influenza due to unidentified influenza virus with other respiratory manifestations: Secondary | ICD-10-CM

## 2018-06-01 DIAGNOSIS — R69 Illness, unspecified: Principal | ICD-10-CM

## 2018-06-01 MED ORDER — BENZONATATE 200 MG PO CAPS: 200.0000 mg | ORAL_CAPSULE | Freq: Three times a day (TID) | ORAL | 0 refills | Status: DC | PRN

## 2018-06-01 NOTE — Telephone Encounter (Signed)
Pt called clinic stating she was seen for flu like illness on 05/22/18. Has completed the tamilflu and cough syrup. Reports no fever and body aches, so she does feel better - but still has a bad cough. Questions if she could come in for reevaluation or if there is something that could be sent in for her. Willing to come for chest xray if needed. Will route.

## 2018-06-01 NOTE — Telephone Encounter (Signed)
Sounds like simple postinfectious cough which can last a month.  Adding [employer], she can continue to use Tussionex at bedtime.

## 2018-06-01 NOTE — Telephone Encounter (Signed)
Pt advised. Verbalized understanding. No further questions.  

## 2018-06-08 ENCOUNTER — Telehealth: Payer: Self-pay

## 2018-06-08 DIAGNOSIS — R748 Abnormal levels of other serum enzymes: Secondary | ICD-10-CM

## 2018-06-08 DIAGNOSIS — E039 Hypothyroidism, unspecified: Secondary | ICD-10-CM

## 2018-06-08 DIAGNOSIS — E781 Pure hyperglyceridemia: Secondary | ICD-10-CM

## 2018-06-08 NOTE — Telephone Encounter (Signed)
Pt called, states she is due for refill on her thyroid meds but also needs labs done/physical.   OK to go ahead and order labs and have pt schedule an appt at a later time?

## 2018-06-09 DIAGNOSIS — E039 Hypothyroidism, unspecified: Secondary | ICD-10-CM | POA: Diagnosis not present

## 2018-06-09 DIAGNOSIS — E781 Pure hyperglyceridemia: Secondary | ICD-10-CM | POA: Diagnosis not present

## 2018-06-09 DIAGNOSIS — R748 Abnormal levels of other serum enzymes: Secondary | ICD-10-CM | POA: Diagnosis not present

## 2018-06-09 NOTE — Addendum Note (Signed)
Addended by: Jed Limerick on: 06/09/2018 08:10 AM   Modules accepted: Orders

## 2018-06-09 NOTE — Telephone Encounter (Signed)
Yes ok to order lipid, cmp, TSH make sure patient is fasting!

## 2018-06-09 NOTE — Telephone Encounter (Signed)
Labs ordered,pt advised. Will come have labs drawn and will schedule physical once "things calm down" as far as COVID scare.

## 2018-06-10 ENCOUNTER — Other Ambulatory Visit: Payer: Self-pay

## 2018-06-10 ENCOUNTER — Other Ambulatory Visit: Payer: Self-pay | Admitting: Physician Assistant

## 2018-06-10 DIAGNOSIS — E039 Hypothyroidism, unspecified: Secondary | ICD-10-CM

## 2018-06-10 LAB — COMPLETE METABOLIC PANEL WITH GFR
AG Ratio: 2.1 (calc) (ref 1.0–2.5)
ALT: 61 U/L — AB (ref 6–29)
AST: 115 U/L — ABNORMAL HIGH (ref 10–30)
Albumin: 4.5 g/dL (ref 3.6–5.1)
Alkaline phosphatase (APISO): 54 U/L (ref 31–125)
BUN: 15 mg/dL (ref 7–25)
CO2: 29 mmol/L (ref 20–32)
Calcium: 9.9 mg/dL (ref 8.6–10.2)
Chloride: 100 mmol/L (ref 98–110)
Creat: 0.92 mg/dL (ref 0.50–1.10)
GFR, EST AFRICAN AMERICAN: 92 mL/min/{1.73_m2} (ref >=60)
GFR, Est Non African American: 80 mL/min/{1.73_m2} (ref >=60)
Globulin: 2.1 g/dL (calc) (ref 1.9–3.7)
Glucose, Bld: 81 mg/dL (ref 65–99)
Potassium: 4.6 mmol/L (ref 3.5–5.3)
Sodium: 137 mmol/L (ref 135–146)
TOTAL PROTEIN: 6.6 g/dL (ref 6.1–8.1)
Total Bilirubin: 0.4 mg/dL (ref 0.2–1.2)

## 2018-06-10 LAB — TSH: TSH: 0.86 mIU/L

## 2018-06-10 LAB — LIPID PANEL W/REFLEX DIRECT LDL
Cholesterol: 239 mg/dL — ABNORMAL HIGH (ref <200)
HDL: 94 mg/dL (ref >=50)
LDL Cholesterol (Calc): 118 mg/dL (calc) — ABNORMAL HIGH
Non-HDL Cholesterol (Calc): 145 mg/dL (calc) — ABNORMAL HIGH (ref <130)
TRIGLYCERIDES: 156 mg/dL — AB (ref <150)
Total CHOL/HDL Ratio: 2.5 (calc) (ref <5.0)

## 2018-06-10 MED ORDER — LEVOTHYROXINE SODIUM 50 MCG PO TABS: 50.0000 ug | ORAL_TABLET | Freq: Every day | ORAL | 1 refills | Status: DC

## 2018-06-10 NOTE — Addendum Note (Signed)
Addended by: Jomarie Longs on: 06/10/2018 09:31 AM   Modules accepted: Orders

## 2018-06-10 NOTE — Telephone Encounter (Signed)
That's perfect.

## 2018-06-10 NOTE — Telephone Encounter (Signed)
Labs look good per provider. Sending in 2 months of thyroid medication to hold pt over until she is able toamke physical appt

## 2018-06-10 NOTE — Telephone Encounter (Signed)
Call pt: kidney, sugar and thyroid look great.  TG look the best they have in a while. Keep doing what you are doing.   Your liver enzyme are up some. More alcohol? More tylenol? Taking any supplements?

## 2018-06-10 NOTE — Telephone Encounter (Signed)
Ok in next month or so I would like to recheck liver enzymes. Will send refills.

## 2018-06-11 NOTE — Telephone Encounter (Addendum)
Approvedtoday ([employer]) Effective from 06/11/2018 through 06/09/2021. Pharmacy aware.

## 2018-06-17 DIAGNOSIS — F3342 Major depressive disorder, recurrent, in full remission: Secondary | ICD-10-CM | POA: Diagnosis not present

## 2018-06-17 DIAGNOSIS — F4322 Adjustment disorder with anxiety: Secondary | ICD-10-CM | POA: Diagnosis not present

## 2018-06-25 ENCOUNTER — Telehealth: Payer: Self-pay | Admitting: Neurology

## 2018-06-25 ENCOUNTER — Ambulatory Visit (INDEPENDENT_AMBULATORY_CARE_PROVIDER_SITE_OTHER): Payer: BLUE CROSS/BLUE SHIELD | Admitting: Sports Medicine

## 2018-06-25 ENCOUNTER — Encounter: Payer: Self-pay | Admitting: Sports Medicine

## 2018-06-25 DIAGNOSIS — L989 Disorder of the skin and subcutaneous tissue, unspecified: Secondary | ICD-10-CM

## 2018-06-25 MED ORDER — CLOTRIMAZOLE-BETAMETHASONE 1-0.05 % EX CREA: 1.0000 "application " | TOPICAL_CREAM | Freq: Two times a day (BID) | CUTANEOUS | 0 refills | Status: DC

## 2018-06-25 NOTE — Telephone Encounter (Signed)
Patient left a vm stating she believes she has ringworm. She works closely with animals. Spoke with patient and she is agreeable to a virtual visit to discuss. Message sent to front desk to schedule virtual visit with Dr. Linford Arnold for today.  Dr. Linford Arnold - FYI.

## 2018-06-25 NOTE — Progress Notes (Signed)
Subjective:    CC: Skin rash  HPI: For 2 weeks now this pleasant 38 year old female has noted a pruritic rash on her right forearm, she had more of an extensive macular rash, also pruritic which is resolving.  She does work with animals daily, symptoms are moderate, persistent, localized without radiation.  I reviewed the past medical history, family history, social history, surgical history, and allergies today and no changes were needed.  Please see the problem list section below in epic for further details.  Past Medical History: Past Medical History:  Diagnosis Date  . Anxiety   . Depression   . Hypothyroidism   . Migraine   . Preeclampsia    Past Surgical History: Past Surgical History:  Procedure Laterality Date  . TONSILECTOMY/ADENOIDECTOMY WITH MYRINGOTOMY     Social History: Social History   Socioeconomic History  . Marital status: Married    Spouse name: Ivin Booty  . Number of children: 0  . Years of education: Not on file  . Highest education level: Not on file  Occupational History  . Occupation: Designer, television/film set  Social Needs  . Financial resource strain: Not on file  . Food insecurity:    Worry: Not on file    Inability: Not on file  . Transportation needs:    Medical: Not on file    Non-medical: Not on file  Tobacco Use  . Smoking status: Never Smoker  . Smokeless tobacco: Never Used  Substance and Sexual Activity  . Alcohol use: Yes    Comment: 5 drinks a week  . Drug use: No  . Sexual activity: Yes    Birth control/protection: None  Lifestyle  . Physical activity:    Days per week: Not on file    Minutes per session: Not on file  . Stress: Not on file  Relationships  . Social connections:    Talks on phone: Not on file    Gets together: Not on file    Attends religious service: Not on file    Active member of club or organization: Not on file    Attends meetings of clubs or organizations: Not on file    Relationship status: Not on file   Other Topics Concern  . Not on file  Social History Narrative   Lives w/ husband   Family History: Family History  Problem Relation Age of Onset  . Alcohol abuse Father   . Atrial fibrillation Mother   . Hyperlipidemia Other   . Migraines Neg Hx    Allergies: Allergies  Allergen Reactions  . Labetalol     Numbness and tingling/jerking/aphasia  . Latex Hives  . Shellfish Allergy Anaphylaxis   Medications: See med rec.  Review of Systems: No fevers, chills, night sweats, weight loss, chest pain, or shortness of breath.   Objective:    General: Well Developed, well nourished, and in no acute distress.  Neuro: Alert and oriented x3, extra-ocular muscles intact, sensation grossly intact.  HEENT: Normocephalic, atraumatic, pupils equal round reactive to light, neck supple, no masses, no lymphadenopathy, thyroid nonpalpable.  Skin: Warm and dry, no rashes. Cardiac: Regular rate and rhythm, no murmurs rubs or gallops, no lower extremity edema.  Respiratory: Clear to auscultation bilaterally. Not using accessory muscles, speaking in full sentences.    Impression and Recommendations:    Annular skin lesion Multiple differentials here, guttate psoriasis, granuloma annulare, nummular eczema, and ringworm. Lotrisone twice a day for 2 weeks, if persistence of symptoms we will do biopsy  here in the office.   ___________________________________________ Ihor Austinhomas J. Benjamin Stainhekkekandam, M.D., ABFM., CAQSM. Primary Care and Sports Medicine [facility]  Adjunct Professor of Family Medicine  [employer]

## 2018-06-25 NOTE — Assessment & Plan Note (Signed)
Multiple differentials here, guttate psoriasis, granuloma annulare, nummular eczema, and ringworm. Lotrisone twice a day for 2 weeks, if persistence of symptoms we will do biopsy here in the office.

## 2018-07-08 ENCOUNTER — Ambulatory Visit: Payer: BLUE CROSS/BLUE SHIELD | Admitting: Sports Medicine

## 2018-07-22 ENCOUNTER — Encounter: Payer: Self-pay | Admitting: Physician Assistant

## 2018-07-22 ENCOUNTER — Ambulatory Visit (INDEPENDENT_AMBULATORY_CARE_PROVIDER_SITE_OTHER): Payer: BLUE CROSS/BLUE SHIELD | Admitting: Physician Assistant

## 2018-07-22 VITALS — BP 128/87 | HR 73 | Temp 97.8°F | Ht 63.0 in | Wt 136.0 lb

## 2018-07-22 DIAGNOSIS — M5412 Radiculopathy, cervical region: Secondary | ICD-10-CM | POA: Diagnosis not present

## 2018-07-22 DIAGNOSIS — Z0000 Encounter for general adult medical examination without abnormal findings: Secondary | ICD-10-CM

## 2018-07-22 DIAGNOSIS — F418 Other specified anxiety disorders: Secondary | ICD-10-CM

## 2018-07-22 DIAGNOSIS — E781 Pure hyperglyceridemia: Secondary | ICD-10-CM | POA: Diagnosis not present

## 2018-07-22 DIAGNOSIS — R748 Abnormal levels of other serum enzymes: Secondary | ICD-10-CM

## 2018-07-22 MED ORDER — METHOCARBAMOL 500 MG PO TABS: 500.0000 mg | ORAL_TABLET | Freq: Three times a day (TID) | ORAL | 1 refills | Status: DC | PRN

## 2018-07-22 MED ORDER — FISH OIL 1000 MG PO CAPS: 4000.0000 mg | ORAL_CAPSULE | Freq: Every day | ORAL | 11 refills | Status: DC

## 2018-07-22 NOTE — Patient Instructions (Signed)

## 2018-07-22 NOTE — Progress Notes (Signed)
Subjective:     Melissa Sharp is a 38 y.o. female and is here for a comprehensive physical exam. The patient reports problems - her mood is down and feels uncertain during the times of COVID pandemic. no SI/HC. Marland Kitchen  Social History   Socioeconomic History  . Marital status: Married    Spouse name: Melissa Sharp  . Number of children: 0  . Years of education: Not on file  . Highest education level: Not on file  Occupational History  . Occupation: Designer, television/film set  Social Needs  . Financial resource strain: Not on file  . Food insecurity:    Worry: Not on file    Inability: Not on file  . Transportation needs:    Medical: Not on file    Non-medical: Not on file  Tobacco Use  . Smoking status: Never Smoker  . Smokeless tobacco: Never Used  Substance and Sexual Activity  . Alcohol use: Yes    Comment: 5 drinks a week  . Drug use: No  . Sexual activity: Yes    Birth control/protection: None  Lifestyle  . Physical activity:    Days per week: Not on file    Minutes per session: Not on file  . Stress: Not on file  Relationships  . Social connections:    Talks on phone: Not on file    Gets together: Not on file    Attends religious service: Not on file    Active member of club or organization: Not on file    Attends meetings of clubs or organizations: Not on file    Relationship status: Not on file  . Intimate partner violence:    Fear of current or ex partner: Not on file    Emotionally abused: Not on file    Physically abused: Not on file    Forced sexual activity: Not on file  Other Topics Concern  . Not on file  Social History Narrative   Lives w/ husband   Health Maintenance  Topic Date Due  . INFLUENZA VACCINE  10/17/2018  . PAP SMEAR-Modifier  03/26/2021  . TETANUS/TDAP  10/28/2023  . HIV Screening  Completed    The following portions of the patient's history were reviewed and updated as appropriate: allergies, current medications, past family history, past medical  history, past social history, past surgical history and problem list.  Review of Systems A comprehensive review of systems was negative.   Objective:    BP 128/87   Pulse 73   Temp 97.8 F (36.6 C) (Oral)   Ht 5\' 3"  (1.6 m)   Wt 136 lb (61.7 kg)   SpO2 99%   BMI 24.09 kg/m  General appearance: alert, cooperative and appears stated age Head: Normocephalic, without obvious abnormality, atraumatic Eyes: conjunctivae/corneas clear. PERRL, EOM's intact. Fundi benign. Ears: normal TM's and external ear canals both ears Nose: Nares normal. Septum midline. Mucosa normal. No drainage or sinus tenderness. Throat: lips, mucosa, and tongue normal; teeth and gums normal Neck: no adenopathy, no carotid bruit, no JVD, supple, symmetrical, trachea midline and thyroid not enlarged, symmetric, no tenderness/mass/nodules Back: symmetric, no curvature. ROM normal. No CVA tenderness. Lungs: clear to auscultation bilaterally Heart: regular rate and rhythm, S1, S2 normal, no murmur, click, rub or gallop Abdomen: soft, non-tender; bowel sounds normal; no masses,  no organomegaly Extremities: extremities normal, atraumatic, no cyanosis or edema Pulses: 2+ and symmetric Skin: Skin color, texture, turgor normal. No rashes or lesions Lymph nodes: Cervical, supraclavicular, and axillary  nodes normal. Neurologic: Alert and oriented X 3, normal strength and tone. Normal symmetric reflexes. Normal coordination and gait    Assessment:    Healthy female exam.      Plan:     Marland Kitchen.Marland Kitchen.Melissa Sharp was seen today for annual exam.  Diagnoses and all orders for this visit:  Routine physical examination  Cervical radiculopathy -     methocarbamol (ROBAXIN) 500 MG tablet; Take 1 tablet (500 mg total) by mouth 3 (three) times daily as needed for muscle spasms.  Elevated liver enzymes -     Hepatic function panel  Hypertriglyceridemia -     Omega-3 Fatty Acids (FISH OIL) 1000 MG CAPS; Take 4 capsules (4,000 mg total)  by mouth daily.  Mixed anxiety depressive disorder   .Marland Kitchen. [facility] 07/22/2018 04/03/2018 05/07/2017  Decreased Interest 2 0 0  Down, Depressed, Hopeless 2 0 0  PHQ - 2 Score 4 0 0  Altered sleeping 2 2 -  Tired, decreased energy 2 2 -  Change in appetite 0 0 -  Feeling bad or failure about yourself  0 0 -  Trouble concentrating 0 0 -  Moving slowly or fidgety/restless 0 0 -  Suicidal thoughts 0 0 -  PHQ-9 Score 8 4 -  Difficult doing work/chores Somewhat difficult Somewhat difficult -    .Marland Kitchen. [facility] 07/22/2018 04/03/2018 05/07/2017  Decreased Interest 2 0 0  Down, Depressed, Hopeless 2 0 0  PHQ - 2 Score 4 0 0  Altered sleeping 2 2 -  Tired, decreased energy 2 2 -  Change in appetite 0 0 -  Feeling bad or failure about yourself  0 0 -  Trouble concentrating 0 0 -  Moving slowly or fidgety/restless 0 0 -  Suicidal thoughts 0 0 -  PHQ-9 Score 8 4 -  Difficult doing work/chores Somewhat difficult Somewhat difficult -   .. GAD 7 : Generalized Anxiety Score 07/22/2018 04/03/2018  Nervous, Anxious, on Edge 0 0  Control/stop worrying 1 0  Worry too much - different things 1 0  Trouble relaxing 1 0  Restless 0 0  Easily annoyed or irritable 0 0  Afraid - awful might happen 0 0  Total GAD 7 Score 3 0  Anxiety Difficulty Somewhat difficult Not difficult at all    .Marland Kitchen. Discussed 150 minutes of exercise a week.  Encouraged vitamin D 1000 units and Calcium 1300mg  or 4 servings of dairy a day.  Pap up to date with GYN.  Fasting labs done and reviewed.  Will recheck elevated liver enzymes after stopping tylenol and alcohol.  TG much better. Continue fish oil.  Weight looks great.   [facility] during these uncertain time. Discussed taking time for herself. Date night with take out at the park etc. Make sure to be exercising.   See After Visit Summary for Counseling Recommendations

## 2018-07-23 LAB — HEPATIC FUNCTION PANEL
AG Ratio: 2.2 (calc) (ref 1.0–2.5)
ALT: 31 U/L — ABNORMAL HIGH (ref 6–29)
AST: 37 U/L — ABNORMAL HIGH (ref 10–30)
Albumin: 4.6 g/dL (ref 3.6–5.1)
Alkaline phosphatase (APISO): 42 U/L (ref 31–125)
Bilirubin, Direct: 0.1 mg/dL (ref 0.0–0.2)
Globulin: 2.1 g/dL (calc) (ref 1.9–3.7)
Indirect Bilirubin: 0.4 mg/dL (calc) (ref 0.2–1.2)
Total Bilirubin: 0.5 mg/dL (ref 0.2–1.2)
Total Protein: 6.7 g/dL (ref 6.1–8.1)

## 2018-07-24 NOTE — Progress Notes (Signed)
Call pt: liver enzymes MUCH better. Just a hair elevated. Not concerning.

## 2018-08-03 DIAGNOSIS — I10 Essential (primary) hypertension: Secondary | ICD-10-CM | POA: Diagnosis not present

## 2018-08-26 ENCOUNTER — Encounter: Payer: Self-pay | Admitting: Sports Medicine

## 2018-08-26 ENCOUNTER — Ambulatory Visit: Payer: BLUE CROSS/BLUE SHIELD | Admitting: Sports Medicine

## 2018-08-26 DIAGNOSIS — H0014 Chalazion left upper eyelid: Secondary | ICD-10-CM | POA: Diagnosis not present

## 2018-08-26 HISTORY — DX: Chalazion left upper eyelid: H00.14

## 2018-08-26 MED ORDER — PREDNISONE 50 MG PO TABS: ORAL_TABLET | ORAL | 0 refills | Status: DC

## 2018-08-26 NOTE — Patient Instructions (Signed)
Chalazion  A chalazion is a swelling or lump on the eyelid. It can affect the upper eyelid or the lower eyelid. What are the causes? This condition may be caused by:  Long-lasting (chronic) inflammation of the eyelid glands.  A blocked oil gland in the eyelid. What are the signs or symptoms? Symptoms of this condition include:  Swelling of the eyelid. The swelling may spread to areas around the eye.  A hard lump on the eyelid.  Blurry vision. The lump on the eyelid may make it hard to see out of the eye. How is this diagnosed? This condition is diagnosed with an examination of the eye. How is this treated? This condition is treated by applying a warm compress to the eyelid. If the condition does not improve, it may be treated with:  Medicine that is injected into the chalazion by a health care provider.  Surgery.  Medicine that is applied to the eye. Follow these instructions at home: Managing pain and swelling  Apply a warm, moist compress to the eyelid 4-6 times a day for 10-15 minutes at a time. This will help to open any blocked glands and to reduce redness and swelling.  Apply over-the-counter and prescription medicines only as told by your health care provider. General instructions  Do not touch the chalazion.  Do not try to remove the pus. Do not squeeze the chalazion or stick it with a pin or needle.  Do not rub your eyes.  Wash your hands often. Dry your hands with a clean towel.  Keep your face, scalp, and eyebrows clean.  Avoid wearing eye makeup.  If the chalazion does not break open (rupture) on its own, return to your health care provider.  Keep all follow-up appointments as told by your health care provider. This is important. Contact a health care provider if:  Your eyelid has not improved in 4 weeks.  Your eyelid is getting worse.  You have a fever.  The chalazion does not rupture on its own after a month of home treatment. Get help right  away if:  You have pain in your eye.  Your vision changes.  The chalazion becomes painful or red.  The chalazion gets bigger. Summary  A chalazion is a swelling or lump on the upper or lower eyelid.  It may be caused by chronic inflammation or a blocked oil gland.  Apply a warm, moist compress to the eyelid 4-6 times a day for 10-15 minutes at a time.  Keep your face, scalp, and eyebrows clean. This information is not intended to replace advice given to you by your health care provider. Make sure you discuss any questions you have with your health care provider. Document Released: 03/01/2000 Document Revised: 08/21/2017 Document Reviewed: 08/21/2017 Elsevier Interactive Patient Education  2019 Elsevier Inc.  

## 2018-08-26 NOTE — Assessment & Plan Note (Signed)
Adding prednisone for 5 days. She will massage frequently. If insufficient improvement we will refer for surgical drainage.

## 2018-08-26 NOTE — Progress Notes (Signed)
Subjective:    CC: Left eyelid swelling  HPI: For the past couple of days this pleasant 38 year old female has had a nonspecific swelling in her left upper eyelid, not really painful, mild itchy sensations, no visual changes, no redness in the conjunctiva.  Symptoms are mild, persistent.  I reviewed the past medical history, family history, social history, surgical history, and allergies today and no changes were needed.  Please see the problem list section below in epic for further details.  Past Medical History: Past Medical History:  Diagnosis Date  . Anxiety   . Depression   . Hypothyroidism   . Migraine   . Preeclampsia    Past Surgical History: Past Surgical History:  Procedure Laterality Date  . TONSILECTOMY/ADENOIDECTOMY WITH MYRINGOTOMY     Social History: Social History   Socioeconomic History  . Marital status: Married    Spouse name: Vonna Kotyk  . Number of children: 0  . Years of education: Not on file  . Highest education level: Not on file  Occupational History  . Occupation: Designer, fashion/clothing  Social Needs  . Financial resource strain: Not on file  . Food insecurity:    Worry: Not on file    Inability: Not on file  . Transportation needs:    Medical: Not on file    Non-medical: Not on file  Tobacco Use  . Smoking status: Never Smoker  . Smokeless tobacco: Never Used  Substance and Sexual Activity  . Alcohol use: Yes    Comment: 5 drinks a week  . Drug use: No  . Sexual activity: Yes    Birth control/protection: None  Lifestyle  . Physical activity:    Days per week: Not on file    Minutes per session: Not on file  . Stress: Not on file  Relationships  . Social connections:    Talks on phone: Not on file    Gets together: Not on file    Attends religious service: Not on file    Active member of club or organization: Not on file    Attends meetings of clubs or organizations: Not on file    Relationship status: Not on file  Other Topics Concern   . Not on file  Social History Narrative   Lives w/ husband   Family History: Family History  Problem Relation Age of Onset  . Alcohol abuse Father   . Atrial fibrillation Mother   . Hyperlipidemia Other   . Migraines Neg Hx    Allergies: Allergies  Allergen Reactions  . Labetalol     Numbness and tingling/jerking/aphasia  . Latex Hives  . Shellfish Allergy Anaphylaxis   Medications: See med rec.  Review of Systems: No fevers, chills, night sweats, weight loss, chest pain, or shortness of breath.   Objective:    General: Well Developed, well nourished, and in no acute distress.  Neuro: Alert and oriented x3, extra-ocular muscles intact, sensation grossly intact.  HEENT: Normocephalic, atraumatic, pupils equal round reactive to light, neck supple, no masses, no lymphadenopathy, thyroid nonpalpable.  There is some redness and fullness in the left upper eyelid, on eversion of the eyelid I do see a chalazion.  No obvious styes.  No conjunctival injection, no anterior chamber chemosis, no hyphema. Skin: Warm and dry, no rashes. Cardiac: Regular rate and rhythm, no murmurs rubs or gallops, no lower extremity edema.  Respiratory: Clear to auscultation bilaterally. Not using accessory muscles, speaking in full sentences.  Impression and Recommendations:  Chalazion left upper eyelid Adding prednisone for 5 days. She will massage frequently. If insufficient improvement we will refer for surgical drainage.    ___________________________________________ Ihor Austinhomas J. Benjamin Stainhekkekandam, M.D., ABFM., CAQSM. Primary Care and Sports Medicine [facility]  Adjunct Professor of Family Medicine  [employer]

## 2018-09-07 ENCOUNTER — Other Ambulatory Visit: Payer: Self-pay | Admitting: Physician Assistant

## 2018-09-07 DIAGNOSIS — E039 Hypothyroidism, unspecified: Secondary | ICD-10-CM

## 2018-09-09 ENCOUNTER — Ambulatory Visit: Payer: BLUE CROSS/BLUE SHIELD | Admitting: Sports Medicine

## 2018-09-16 DIAGNOSIS — I10 Essential (primary) hypertension: Secondary | ICD-10-CM | POA: Diagnosis not present

## 2018-10-06 DIAGNOSIS — M5136 Other intervertebral disc degeneration, lumbar region: Secondary | ICD-10-CM | POA: Diagnosis not present

## 2018-10-06 DIAGNOSIS — L814 Other melanin hyperpigmentation: Secondary | ICD-10-CM | POA: Diagnosis not present

## 2018-10-06 DIAGNOSIS — D225 Melanocytic nevi of trunk: Secondary | ICD-10-CM | POA: Diagnosis not present

## 2018-10-06 DIAGNOSIS — L821 Other seborrheic keratosis: Secondary | ICD-10-CM | POA: Diagnosis not present

## 2018-10-06 DIAGNOSIS — L570 Actinic keratosis: Secondary | ICD-10-CM | POA: Diagnosis not present

## 2018-10-06 DIAGNOSIS — M9903 Segmental and somatic dysfunction of lumbar region: Secondary | ICD-10-CM | POA: Diagnosis not present

## 2018-10-06 DIAGNOSIS — M5137 Other intervertebral disc degeneration, lumbosacral region: Secondary | ICD-10-CM | POA: Diagnosis not present

## 2018-10-06 DIAGNOSIS — D485 Neoplasm of uncertain behavior of skin: Secondary | ICD-10-CM | POA: Diagnosis not present

## 2018-10-06 DIAGNOSIS — M9902 Segmental and somatic dysfunction of thoracic region: Secondary | ICD-10-CM | POA: Diagnosis not present

## 2018-11-03 NOTE — Telephone Encounter (Signed)
Patient stopped by the office to pick up 12 sample boxes of Vacepa that a drug representative had dropped off.   Lot: [id number] [place]: [phone] EXP: 08/14/2021.

## 2018-12-16 DIAGNOSIS — L905 Scar conditions and fibrosis of skin: Secondary | ICD-10-CM | POA: Diagnosis not present

## 2018-12-16 DIAGNOSIS — L719 Rosacea, unspecified: Secondary | ICD-10-CM | POA: Diagnosis not present

## 2018-12-16 DIAGNOSIS — S90852A Superficial foreign body, left foot, initial encounter: Secondary | ICD-10-CM | POA: Diagnosis not present

## 2018-12-17 ENCOUNTER — Encounter: Payer: Self-pay | Admitting: Physician Assistant

## 2018-12-17 ENCOUNTER — Ambulatory Visit (INDEPENDENT_AMBULATORY_CARE_PROVIDER_SITE_OTHER): Payer: BLUE CROSS/BLUE SHIELD | Admitting: Physician Assistant

## 2018-12-17 VITALS — Temp 98.7°F | Wt 131.0 lb

## 2018-12-17 DIAGNOSIS — J0101 Acute recurrent maxillary sinusitis: Secondary | ICD-10-CM | POA: Diagnosis not present

## 2018-12-17 DIAGNOSIS — J3089 Other allergic rhinitis: Secondary | ICD-10-CM

## 2018-12-17 MED ORDER — AZITHROMYCIN 250 MG PO TABS: ORAL_TABLET | ORAL | 0 refills | Status: DC

## 2018-12-17 MED ORDER — PREDNISONE 50 MG PO TABS: ORAL_TABLET | ORAL | 0 refills | Status: DC

## 2018-12-17 MED ORDER — ALBUTEROL SULFATE HFA 108 (90 BASE) MCG/ACT IN AERS: 2.0000 | INHALATION_SPRAY | Freq: Four times a day (QID) | RESPIRATORY_TRACT | 0 refills | Status: DC | PRN

## 2018-12-17 NOTE — Progress Notes (Signed)
Patient ID: Melissa Sharp, female   DOB: [birth year], 38 y.o.   MRN: [id number] .Marland KitchenVirtual Visit via Video Note  I connected with Melissa Sharp on 12/17/18 at  1:40 PM EDT by a video enabled telemedicine application and verified that I am speaking with the correct person using two identifiers.  Location: Patient: home Provider: clinic   I discussed the limitations of evaluation and management by telemedicine and the availability of in person appointments. The patient expressed understanding and agreed to proceed.  History of Present Illness: Pt is a 38 yo female with perineal allergies and recurrent sinus infections who calls in with sinus pressure, headache for over a week. She has been struggling with allergies for the last month or so. Sinus symptoms just recently got bad. Pt denies any direct covid contacts. No sick contacts. No loss of smell or taste, body aches, fatigue, chills, myalgias, GI symptoms.    .. Active Ambulatory Problems    Diagnosis Date Noted  . IBS (irritable bowel syndrome) 10/27/2013  . Thyroid activity decreased 10/27/2013  . Migraine without aura, without mention of intractable migraine without mention of status migrainosus 10/29/2013  . Esophageal reflux 10/29/2013  . Depression, major, recurrent (HCC) 10/29/2013  . Mixed anxiety depressive disorder 10/29/2013  . B12 deficiency 10/29/2013  . Hypertriglyceridemia 10/29/2013  . Elevated liver enzymes 11/02/2013  . Poor venous access 01/24/2015  . Post concussive syndrome 05/21/2015  . Cervical pain (neck) 06/26/2015  . Intractable migraine 06/30/2015  . Migraine with status migrainosus 07/15/2015  . PIH (pregnancy induced hypertension) 07/28/2015  . Diastasis recti 02/20/2016  . Low back pain with radiation, unspecified laterality 02/23/2016  . Overweight (BMI 25.0-29.9) 03/20/2016  . Elevated blood pressure reading 05/03/2016  . Tachycardia with heart rate 100-120 beats per minute 02/25/2017  . Tapeworm  infection 02/25/2017  . Non-seasonal allergic rhinitis 05/08/2017  . Anxiety 05/08/2017  . Functional diarrhea 07/01/2017  . Right upper quadrant guarding 07/01/2017  . Bloating 07/01/2017  . Nausea 07/01/2017  . Cervical radiculopathy 03/15/2018  . Muscle weakness of left upper extremity 03/15/2018  . Hypertension goal BP (blood pressure) < 130/80 03/15/2018  . Influenza-like illness 05/22/2018  . Annular skin lesion 06/25/2018  . Chalazion left upper eyelid 08/26/2018   Resolved Ambulatory Problems    Diagnosis Date Noted  . Pregnancy 01/24/2015  . Viral gastroenteritis 01/24/2015  . Nausea and vomiting during pregnancy 03/07/2015  . Dizziness 03/07/2015  . Dehydration 03/07/2015  . Musculoskeletal neck pain 06/26/2015   Past Medical History:  Diagnosis Date  . Depression   . Hypothyroidism   . Migraine   . Preeclampsia    Reviewed med, allergies, problem list.     Observations/Objective: No acute distress.  Normal appearance and mood.  Flushed cheeks.  Congested sounds.  No cough, normal breathing and respirations.   .. Today's Vitals   12/17/18 1252  Temp: 98.7 F (37.1 C)  TempSrc: Oral  Weight: 131 lb (59.4 kg)   Body mass index is 23.21 kg/m.     Assessment and Plan:  Marland KitchenMarland KitchenAadvika was seen today for sinus problem.  Diagnoses and all orders for this visit:  Acute recurrent maxillary sinusitis -     azithromycin (ZITHROMAX) 250 MG tablet; 2 tablets now and one tablet for 4 days. -     predniSONE (DELTASONE) 50 MG tablet; Take one tablet for 5 days. -     albuterol (VENTOLIN HFA) 108 (90 Base) MCG/ACT inhaler; Inhale 2 puffs into the lungs every 6 (six)  hours as needed for wheezing or shortness of breath.  Environmental and seasonal allergies   Appears like allergies likely induced sinus infection. No signs of COVID. Treated with augmentin, flonase, prednisone, albuterol. Encouraged daily antihistamine and sinus rinses. Follow up as needed.     Follow Up Instructions:    I discussed the assessment and treatment plan with the patient. The patient was provided an opportunity to ask questions and all were answered. The patient agreed with the plan and demonstrated an understanding of the instructions.   The patient was advised to call back or seek an in-person evaluation if the symptoms worsen or if the condition fails to improve as anticipated.    Iran Planas, PA-C

## 2018-12-17 NOTE — Progress Notes (Deleted)
Sinus issues- same every year. Cough, congestions, itchy and watery eyes. OTC allergy meds- not much help. No fevers. Raspy voice, but no sore throat

## 2018-12-24 DIAGNOSIS — N941 Unspecified dyspareunia: Secondary | ICD-10-CM | POA: Diagnosis not present

## 2018-12-24 DIAGNOSIS — Z6823 Body mass index (BMI) 23.0-23.9, adult: Secondary | ICD-10-CM | POA: Diagnosis not present

## 2018-12-24 DIAGNOSIS — Z01419 Encounter for gynecological examination (general) (routine) without abnormal findings: Secondary | ICD-10-CM | POA: Diagnosis not present

## 2018-12-31 DIAGNOSIS — F411 Generalized anxiety disorder: Secondary | ICD-10-CM | POA: Diagnosis not present

## 2018-12-31 DIAGNOSIS — F3342 Major depressive disorder, recurrent, in full remission: Secondary | ICD-10-CM | POA: Diagnosis not present

## 2019-01-15 ENCOUNTER — Telehealth: Payer: Self-pay | Admitting: Neurology

## 2019-01-15 NOTE — Telephone Encounter (Signed)
Patient left vm. She wanted a call back from [place], no other details left.   I called patient and she states she was calling to apologize. She said her Dermatologist had reached out to [facility], and patient was taking new medication during Dermatology visit. She states she had a bad reaction, got confused, and had a panic attack. She isn't sure why Luvenia Starch was contacted, but wanted to let her know. She is unsure what medication she was taking so we could add to allergy list. [place].

## 2019-01-15 NOTE — Telephone Encounter (Signed)
Ok let her know I never got contacted from dermatology. Who put her on the new medication?

## 2019-01-18 NOTE — Telephone Encounter (Signed)
Patient made aware. She states medication was from psychiatrist. She was taking cold medication with it and didn't know she shouldn't. Will call back if needed.

## 2019-02-20 DIAGNOSIS — Z20828 Contact with and (suspected) exposure to other viral communicable diseases: Secondary | ICD-10-CM | POA: Diagnosis not present

## 2019-02-24 ENCOUNTER — Telehealth: Payer: Self-pay | Admitting: Physician Assistant

## 2019-02-24 ENCOUNTER — Ambulatory Visit (INDEPENDENT_AMBULATORY_CARE_PROVIDER_SITE_OTHER): Payer: BLUE CROSS/BLUE SHIELD | Admitting: Physician Assistant

## 2019-02-24 VITALS — Temp 98.9°F | Ht 63.0 in | Wt 131.0 lb

## 2019-02-24 DIAGNOSIS — R059 Cough, unspecified: Secondary | ICD-10-CM

## 2019-02-24 DIAGNOSIS — Z20822 Contact with and (suspected) exposure to covid-19: Secondary | ICD-10-CM

## 2019-02-24 DIAGNOSIS — R05 Cough: Secondary | ICD-10-CM | POA: Diagnosis not present

## 2019-02-24 DIAGNOSIS — Z20828 Contact with and (suspected) exposure to other viral communicable diseases: Secondary | ICD-10-CM | POA: Diagnosis not present

## 2019-02-24 MED ORDER — PREDNISONE 50 MG PO TABS: ORAL_TABLET | ORAL | 0 refills | Status: DC

## 2019-02-24 MED ORDER — HYDROCOD POLST-CPM POLST ER 10-8 MG/5ML PO SUER: 5.0000 mL | Freq: Two times a day (BID) | ORAL | 0 refills | Status: DC | PRN

## 2019-02-24 NOTE — Telephone Encounter (Signed)
Ill see her today if she needs to be.

## 2019-02-24 NOTE — Telephone Encounter (Signed)
Appt made with patient.  

## 2019-02-24 NOTE — Telephone Encounter (Signed)
What time could you do a virtual? Please advise.

## 2019-02-24 NOTE — Telephone Encounter (Signed)
Patient called and was tested on 02/20/2019 negative. She around family recently and they tested positive as of Monday.  She was having a fever, cough, rash on her lower arm but it has gone away. She has lost her voice back. She is also having some fatigue. Patient is wanting to see is other doctors in the office are in her network and call the office back for a virtual follow up. She was advised to go to [facility] or ED if symptoms worse. She did not have any questions.   Does she need a repeat covid test or wait for a virtual visit. Please advise.

## 2019-02-24 NOTE — Progress Notes (Signed)
From phone note: Patient called and was tested on 02/20/2019 negative. She around family recently and they tested positive as of Monday.  She was having a fever, cough, rash on her lower arm but it has gone away. She has lost her voice back. She is also having some fatigue. Patient is wanting to see is other doctors in the office are in her network and call the office back for a virtual follow up. She was advised to go to [facility] or ED if symptoms worse. She did not have any questions.   Does she need a repeat covid test or wait for a virtual visit. Please advise.

## 2019-02-25 LAB — NOVEL CORONAVIRUS, NAA: SARS-CoV-2, NAA: NOT DETECTED

## 2019-02-26 ENCOUNTER — Encounter: Payer: Self-pay | Admitting: Physician Assistant

## 2019-02-26 NOTE — Progress Notes (Signed)
Call patient with negative covid results.

## 2019-02-26 NOTE — Progress Notes (Signed)
Patient ID: Melissa Sharp, female   DOB: [birth year], 38 y.o.   MRN: [id number] .Marland KitchenVirtual Visit via Telephone Note  I connected with Melissa Sharp on 02/24/2019 at  3:20 PM EST by telephone and verified that I am speaking with the correct person using two identifiers.  Location: Patient: home Provider: clinic   I discussed the limitations, risks, security and privacy concerns of performing an evaluation and management service by telephone and the availability of in person appointments. I also discussed with the patient that there may be a patient responsible charge related to this service. The patient expressed understanding and agreed to proceed.   History of Present Illness: Patient is a 38 year old female with a history of allergies but no asthma who presents to the clinic with Covid concerns and direct exposure.  On 02/20/2019 she was tested for Covid and negative after being around a positive family member.  She is concerned that she tested too soon.  She is symptomatic with a intermittent fever, dry cough, unusual rash on her lower arm that has since resolved, sinus congestion, laryngitis, loss of taste. She is very fatigued. Cough is all day and keeps her up at night. She has multiple sick contacts. She is using albuterol inhaler and OtC cough preparations.   .. Active Ambulatory Problems    Diagnosis Date Noted  . IBS (irritable bowel syndrome) 10/27/2013  . Thyroid activity decreased 10/27/2013  . Migraine without aura, without mention of intractable migraine without mention of status migrainosus 10/29/2013  . Esophageal reflux 10/29/2013  . Depression, major, recurrent ([facility]) 10/29/2013  . Mixed anxiety depressive disorder 10/29/2013  . B12 deficiency 10/29/2013  . Hypertriglyceridemia 10/29/2013  . Elevated liver enzymes 11/02/2013  . Poor venous access 01/24/2015  . Post concussive syndrome 05/21/2015  . Cervical pain (neck) 06/26/2015  . Intractable migraine 06/30/2015  .  Migraine with status migrainosus 07/15/2015  . PIH (pregnancy induced hypertension) 07/28/2015  . Diastasis recti 02/20/2016  . Low back pain with radiation, unspecified laterality 02/23/2016  . Overweight (BMI 25.0-29.9) 03/20/2016  . Elevated blood pressure reading 05/03/2016  . Tachycardia with heart rate 100-120 beats per minute 02/25/2017  . Tapeworm infection 02/25/2017  . Non-seasonal allergic rhinitis 05/08/2017  . Anxiety 05/08/2017  . Functional diarrhea 07/01/2017  . Right upper quadrant guarding 07/01/2017  . Bloating 07/01/2017  . Nausea 07/01/2017  . Cervical radiculopathy 03/15/2018  . Muscle weakness of left upper extremity 03/15/2018  . Hypertension goal BP (blood pressure) < 130/80 03/15/2018  . Influenza-like illness 05/22/2018  . Annular skin lesion 06/25/2018  . Chalazion left upper eyelid 08/26/2018   Resolved Ambulatory Problems    Diagnosis Date Noted  . Pregnancy 01/24/2015  . Viral gastroenteritis 01/24/2015  . Nausea and vomiting during pregnancy 03/07/2015  . Dizziness 03/07/2015  . Dehydration 03/07/2015  . Musculoskeletal neck pain 06/26/2015   Past Medical History:  Diagnosis Date  . Depression   . Hypothyroidism   . Migraine   . Preeclampsia    Reviewed med, allergy, problem list.     Observations/Objective: No acute distress. Dry cough and sneezing over the phone.  Congested sound to voice.  No labored breathing.   .. Today's Vitals   02/24/19 1507  Temp: 98.9 F (37.2 C)  TempSrc: Oral  Weight: 131 lb (59.4 kg)  Height: 5\' 3"  (1.6 m)   Body mass index is 23.21 kg/m.    Assessment and Plan: Marland KitchenMarland KitchenVivien was seen today for cough.  Diagnoses and all orders for this  visit:  Suspected COVID-19 virus infection -     Novel Coronavirus, NAA ([facility])  Close exposure to 2019 novel coronavirus -     Novel Coronavirus, NAA ([facility])  Cough -     predniSONE (DELTASONE) 50 MG tablet; One tab PO daily for 5 days. -      chlorpheniramine-HYDROcodone (TUSSIONEX) 10-8 MG/5ML SUER; Take 5 mLs by mouth every 12 (twelve) hours as needed.   Patient does have Covid symptoms and direct exposure.  Will have her come for a drive-through self swab here at [facility].  Certainly with her direct exposure I would self isolate for the next 7 to 10 days.  Rest and hydrate.  Consider over-the-counter medications to treat symptoms.  In good to go ahead and send over a burst of prednisone.  Patient has a tendency with her allergies to have some reactive airway components.  Tussionex given for her to use at bedtime for cough suppression.  Patient has an albuterol inhaler to use as needed.  Follow Up Instructions:    I discussed the assessment and treatment plan with the patient. The patient was provided an opportunity to ask questions and all were answered. The patient agreed with the plan and demonstrated an understanding of the instructions.   The patient was advised to call back or seek an in-person evaluation if the symptoms worsen or if the condition fails to improve as anticipated.   Tandy Gaw, PA-C

## 2019-03-17 ENCOUNTER — Telehealth: Payer: Self-pay | Admitting: Physician Assistant

## 2019-03-17 DIAGNOSIS — E039 Hypothyroidism, unspecified: Secondary | ICD-10-CM

## 2019-03-17 NOTE — Telephone Encounter (Signed)
Ok to send down for labs.

## 2019-03-17 NOTE — Telephone Encounter (Signed)
TSH ordered.  LMOM letting patient know.

## 2019-03-17 NOTE — Telephone Encounter (Signed)
Pt called and states that she needs to have her Thyroid levels checked so she can have her Thyroid meds called in. Please call pt and let her know when lab order is ready

## 2019-03-18 DIAGNOSIS — E039 Hypothyroidism, unspecified: Secondary | ICD-10-CM | POA: Diagnosis not present

## 2019-03-19 LAB — TSH: TSH: 2.19 mIU/L

## 2019-03-21 MED ORDER — LEVOTHYROXINE SODIUM 50 MCG PO TABS: ORAL_TABLET | ORAL | 3 refills | Status: DC

## 2019-03-21 NOTE — Telephone Encounter (Signed)
Thyroid looks great. Refills sent.

## 2019-03-21 NOTE — Addendum Note (Signed)
Addended by: Jomarie Longs on: 03/21/2019 05:57 PM   Modules accepted: Orders

## 2019-04-09 DIAGNOSIS — F411 Generalized anxiety disorder: Secondary | ICD-10-CM | POA: Diagnosis not present

## 2019-04-09 DIAGNOSIS — F331 Major depressive disorder, recurrent, moderate: Secondary | ICD-10-CM | POA: Diagnosis not present

## 2019-04-20 DIAGNOSIS — F332 Major depressive disorder, recurrent severe without psychotic features: Secondary | ICD-10-CM | POA: Diagnosis not present

## 2019-05-07 DIAGNOSIS — F4322 Adjustment disorder with anxiety: Secondary | ICD-10-CM | POA: Diagnosis not present

## 2019-05-11 DIAGNOSIS — F4322 Adjustment disorder with anxiety: Secondary | ICD-10-CM | POA: Diagnosis not present

## 2019-05-20 DIAGNOSIS — F4323 Adjustment disorder with mixed anxiety and depressed mood: Secondary | ICD-10-CM | POA: Diagnosis not present

## 2019-05-21 DIAGNOSIS — F3342 Major depressive disorder, recurrent, in full remission: Secondary | ICD-10-CM | POA: Diagnosis not present

## 2019-05-21 DIAGNOSIS — F411 Generalized anxiety disorder: Secondary | ICD-10-CM | POA: Diagnosis not present

## 2019-05-21 DIAGNOSIS — F900 Attention-deficit hyperactivity disorder, predominantly inattentive type: Secondary | ICD-10-CM | POA: Diagnosis not present

## 2019-06-04 DIAGNOSIS — F4323 Adjustment disorder with mixed anxiety and depressed mood: Secondary | ICD-10-CM | POA: Diagnosis not present

## 2019-06-23 DIAGNOSIS — F4323 Adjustment disorder with mixed anxiety and depressed mood: Secondary | ICD-10-CM | POA: Diagnosis not present

## 2019-07-02 DIAGNOSIS — F4323 Adjustment disorder with mixed anxiety and depressed mood: Secondary | ICD-10-CM | POA: Diagnosis not present

## 2019-07-09 DIAGNOSIS — F4323 Adjustment disorder with mixed anxiety and depressed mood: Secondary | ICD-10-CM | POA: Diagnosis not present

## 2019-07-10 DIAGNOSIS — F410 Panic disorder [episodic paroxysmal anxiety] without agoraphobia: Secondary | ICD-10-CM | POA: Diagnosis not present

## 2019-07-10 DIAGNOSIS — F3342 Major depressive disorder, recurrent, in full remission: Secondary | ICD-10-CM | POA: Diagnosis not present

## 2019-07-10 DIAGNOSIS — F411 Generalized anxiety disorder: Secondary | ICD-10-CM | POA: Diagnosis not present

## 2019-07-29 ENCOUNTER — Ambulatory Visit: Payer: BLUE CROSS/BLUE SHIELD | Admitting: Family Medicine

## 2019-07-30 ENCOUNTER — Ambulatory Visit: Payer: BC Managed Care – PPO | Admitting: Nurse Practitioner

## 2019-08-03 DIAGNOSIS — F3342 Major depressive disorder, recurrent, in full remission: Secondary | ICD-10-CM | POA: Diagnosis not present

## 2019-08-03 DIAGNOSIS — F900 Attention-deficit hyperactivity disorder, predominantly inattentive type: Secondary | ICD-10-CM | POA: Diagnosis not present

## 2019-08-03 DIAGNOSIS — F410 Panic disorder [episodic paroxysmal anxiety] without agoraphobia: Secondary | ICD-10-CM | POA: Diagnosis not present

## 2019-08-13 DIAGNOSIS — F4323 Adjustment disorder with mixed anxiety and depressed mood: Secondary | ICD-10-CM | POA: Diagnosis not present

## 2019-08-27 DIAGNOSIS — F4323 Adjustment disorder with mixed anxiety and depressed mood: Secondary | ICD-10-CM | POA: Diagnosis not present

## 2019-08-31 DIAGNOSIS — F4322 Adjustment disorder with anxiety: Secondary | ICD-10-CM | POA: Diagnosis not present

## 2019-08-31 DIAGNOSIS — F3342 Major depressive disorder, recurrent, in full remission: Secondary | ICD-10-CM | POA: Diagnosis not present

## 2019-08-31 DIAGNOSIS — F900 Attention-deficit hyperactivity disorder, predominantly inattentive type: Secondary | ICD-10-CM | POA: Diagnosis not present

## 2019-08-31 DIAGNOSIS — F411 Generalized anxiety disorder: Secondary | ICD-10-CM | POA: Diagnosis not present

## 2019-09-03 DIAGNOSIS — F4323 Adjustment disorder with mixed anxiety and depressed mood: Secondary | ICD-10-CM | POA: Diagnosis not present

## 2019-09-24 ENCOUNTER — Telehealth (INDEPENDENT_AMBULATORY_CARE_PROVIDER_SITE_OTHER): Payer: BLUE CROSS/BLUE SHIELD | Admitting: Physician Assistant

## 2019-09-24 ENCOUNTER — Telehealth: Payer: BLUE CROSS/BLUE SHIELD | Admitting: Medical-Surgical

## 2019-09-24 ENCOUNTER — Encounter: Payer: Self-pay | Admitting: Physician Assistant

## 2019-09-24 ENCOUNTER — Other Ambulatory Visit: Payer: Self-pay

## 2019-09-24 VITALS — Temp 100.7°F | Ht 63.0 in | Wt 131.0 lb

## 2019-09-24 DIAGNOSIS — J0101 Acute recurrent maxillary sinusitis: Secondary | ICD-10-CM

## 2019-09-24 DIAGNOSIS — J40 Bronchitis, not specified as acute or chronic: Secondary | ICD-10-CM | POA: Diagnosis not present

## 2019-09-24 DIAGNOSIS — J329 Chronic sinusitis, unspecified: Secondary | ICD-10-CM | POA: Diagnosis not present

## 2019-09-24 HISTORY — DX: Chronic sinusitis, unspecified: J32.9

## 2019-09-24 MED ORDER — ALBUTEROL SULFATE HFA 108 (90 BASE) MCG/ACT IN AERS: 2.0000 | INHALATION_SPRAY | Freq: Four times a day (QID) | RESPIRATORY_TRACT | 0 refills | Status: DC | PRN

## 2019-09-24 MED ORDER — PREDNISONE 50 MG PO TABS: ORAL_TABLET | ORAL | 0 refills | Status: DC

## 2019-09-24 MED ORDER — HYDROCOD POLST-CPM POLST ER 10-8 MG/5ML PO SUER: 5.0000 mL | Freq: Two times a day (BID) | ORAL | 0 refills | Status: DC | PRN

## 2019-09-24 MED ORDER — AMOXICILLIN-POT CLAVULANATE 875-125 MG PO TABS: 1.0000 | ORAL_TABLET | Freq: Two times a day (BID) | ORAL | 0 refills | Status: AC

## 2019-09-24 NOTE — Progress Notes (Deleted)
Started 2 days ago  Drainage  Sinus congestion Chest congestion Cough No loss of taste/smell  Has tried netty pot, dayquil, allergy meds - no help

## 2019-09-24 NOTE — Progress Notes (Signed)
Patient ID: Melissa Sharp, female   DOB: 01-07-1981, 39 y.o.   MRN: [id number] .Marland KitchenVirtual Visit via Telephone Note  I connected with Melissa Sharp on 09/24/19 at  2:40 PM EDT by telephone and verified that I am speaking with the correct person using two identifiers.  Location: Patient: home Provider: clinic   I discussed the limitations, risks, security and privacy concerns of performing an evaluation and management service by telephone and the availability of in person appointments. I also discussed with the patient that there may be a patient responsible charge related to this service. The patient expressed understanding and agreed to proceed.   History of Present Illness: Patient is a 39 year old female with history of recurrent sinusitis about twice a year who calls into the clinic with sinus congestion, chest congestion, cough, sinus pressure, fatigue for the last 3 days.  She has tried over-the-counter Tylenol Cold sinus severe, Flonase, allergy medication, DayQuil with little relief.  Patient denies any loss of smell or taste or GI symptoms.  She is having a lot of wheezing and shortness of breath. Her cough is becoming more productive. She has been using her nettie pot. She had 2 sick nieces over last weekend.   .. Active Ambulatory Problems    Diagnosis Date Noted  . IBS (irritable bowel syndrome) 10/27/2013  . Thyroid activity decreased 10/27/2013  . Migraine without aura, without mention of intractable migraine without mention of status migrainosus 10/29/2013  . Esophageal reflux 10/29/2013  . Depression, major, recurrent (HCC) 10/29/2013  . Mixed anxiety depressive disorder 10/29/2013  . B12 deficiency 10/29/2013  . Hypertriglyceridemia 10/29/2013  . Elevated liver enzymes 11/02/2013  . Poor venous access 01/24/2015  . Post concussive syndrome 05/21/2015  . Cervical pain (neck) 06/26/2015  . Intractable migraine 06/30/2015  . Migraine with status migrainosus 07/15/2015  .  PIH (pregnancy induced hypertension) 07/28/2015  . Diastasis recti 02/20/2016  . Low back pain with radiation, unspecified laterality 02/23/2016  . Overweight (BMI 25.0-29.9) 03/20/2016  . Elevated blood pressure reading 05/03/2016  . Tachycardia with heart rate 100-120 beats per minute 02/25/2017  . Tapeworm infection 02/25/2017  . Non-seasonal allergic rhinitis 05/08/2017  . Anxiety 05/08/2017  . Functional diarrhea 07/01/2017  . Right upper quadrant guarding 07/01/2017  . Bloating 07/01/2017  . Nausea 07/01/2017  . Cervical radiculopathy 03/15/2018  . Muscle weakness of left upper extremity 03/15/2018  . Hypertension goal BP (blood pressure) < 130/80 03/15/2018  . Influenza-like illness 05/22/2018  . Annular skin lesion 06/25/2018  . Chalazion left upper eyelid 08/26/2018  . Sinobronchitis 09/24/2019   Resolved Ambulatory Problems    Diagnosis Date Noted  . Pregnancy 01/24/2015  . Viral gastroenteritis 01/24/2015  . Nausea and vomiting during pregnancy 03/07/2015  . Dizziness 03/07/2015  . Dehydration 03/07/2015  . Musculoskeletal neck pain 06/26/2015   Past Medical History:  Diagnosis Date  . Depression   . Hypothyroidism   . Migraine   . Preeclampsia    Reviewed med, allergy, problem list.   Observations/Objective: No acute distress.  Productive cough with no labored breathing.  Congested sound to voice.   .. Today's Vitals   09/24/19 1331  Temp: (!) 100.7 F (38.2 C)  TempSrc: Oral  Weight: 131 lb (59.4 kg)  Height: 5\' 3"  (1.6 m)   Body mass index is 23.21 kg/m.    Assessment and Plan: Marland KitchenShaundrea was seen today for sore throat and cough.  Diagnoses and all orders for this visit:  Sinobronchitis -  chlorpheniramine-HYDROcodone (TUSSIONEX) 10-8 MG/5ML SUER; Take 5 mLs by mouth every 12 (twelve) hours as needed. -     predniSONE (DELTASONE) 50 MG tablet; One tab PO daily for 5 days. -     amoxicillin-clavulanate (AUGMENTIN) 875-125 MG tablet;  Take 1 tablet by mouth 2 (two) times daily for 10 days.  Acute recurrent maxillary sinusitis -     albuterol (VENTOLIN HFA) 108 (90 Base) MCG/ACT inhaler; Inhale 2 puffs into the lungs every 6 (six) hours as needed for wheezing or shortness of breath. -     predniSONE (DELTASONE) 50 MG tablet; One tab PO daily for 5 days. -     amoxicillin-clavulanate (AUGMENTIN) 875-125 MG tablet; Take 1 tablet by mouth 2 (two) times daily for 10 days.  Patient has a recurrent history of sinusitis.  Seems like this 1 is coming to more bronchitis as well.  Refilled her albuterol inhaler.  Treated with Augmentin and prednisone.  Continue conservative symptomatic treatment.  Rest and hydrate.  Follow-up if symptoms worsening or not improving.    Follow Up Instructions:    I discussed the assessment and treatment plan with the patient. The patient was provided an opportunity to ask questions and all were answered. The patient agreed with the plan and demonstrated an understanding of the instructions.   The patient was advised to call back or seek an in-person evaluation if the symptoms worsen or if the condition fails to improve as anticipated.  I provided 9 minutes of non-face-to-face time during this encounter.   Tandy Gaw, PA-C

## 2019-11-09 DIAGNOSIS — N979 Female infertility, unspecified: Secondary | ICD-10-CM | POA: Diagnosis not present

## 2019-11-19 ENCOUNTER — Encounter: Payer: Self-pay | Admitting: Nurse Practitioner

## 2019-11-19 ENCOUNTER — Ambulatory Visit (INDEPENDENT_AMBULATORY_CARE_PROVIDER_SITE_OTHER): Payer: BLUE CROSS/BLUE SHIELD | Admitting: Nurse Practitioner

## 2019-11-19 ENCOUNTER — Other Ambulatory Visit: Payer: Self-pay

## 2019-11-19 VITALS — BP 122/85 | HR 102 | Ht 63.0 in | Wt 127.0 lb

## 2019-11-19 DIAGNOSIS — B354 Tinea corporis: Secondary | ICD-10-CM | POA: Diagnosis not present

## 2019-11-19 MED ORDER — KETOCONAZOLE 2 % EX CREA: 1.0000 "application " | TOPICAL_CREAM | Freq: Every day | CUTANEOUS | 0 refills | Status: DC

## 2019-11-19 NOTE — Progress Notes (Signed)
Acute Office Visit  Subjective:    Patient ID: Melissa Sharp, female    DOB: 1980-12-06, 39 y.o.   MRN: [id number]  Chief Complaint  Patient presents with  . Skin Problem    HPI Patient is in today for concerns for possible ringworm infection on her skin.  She reports that she had 1 area on her right calf which she used over-the-counter fungal spray and this eventually cleared up.  However shortly thereafter she noticed another location just medial to that.  She now has 3-4 scattered spots of irritation and she was concerned she might need something more than the over-the-counter spray available.  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  She denies breaks in the skin, pain, itching, red streaks.  She reports the areas are circular/oval in nature with this scaling across the entire surface and redness around the border.  Past Medical History:  Diagnosis Date  . Anxiety   . Depression   . Hypothyroidism   . Migraine   . Preeclampsia     Past Surgical History:  Procedure Laterality Date  . TONSILECTOMY/ADENOIDECTOMY WITH MYRINGOTOMY      Family History  Problem Relation Age of Onset  . Alcohol abuse Father   . Atrial fibrillation Mother   . Hyperlipidemia Other   . Migraines Neg Hx     Social History   Socioeconomic History  . Marital status: Married    Spouse name: Ivin Booty  . Number of children: 0  . Years of education: Not on file  . Highest education level: Not on file  Occupational History  . Occupation: Designer, television/film set  Tobacco Use  . Smoking status: Never Smoker  . Smokeless tobacco: Never Used  Substance and Sexual Activity  . Alcohol use: Yes    Comment: 5 drinks a week  . Drug use: No  . Sexual activity: Yes    Birth control/protection: None  Other Topics Concern  . Not on file  Social History Narrative   Lives w/ husband   Social Determinants of Health   Financial Resource  Strain:   . Difficulty of Paying Living Expenses: Not on file  Food Insecurity:   . Worried About Programme researcher, broadcasting/film/video in the Last Year: Not on file  . Ran Out of Food in the Last Year: Not on file  Transportation Needs:   . Lack of Transportation (Medical): Not on file  . Lack of Transportation (Non-Medical): Not on file  Physical Activity:   . Days of Exercise per Week: Not on file  . Minutes of Exercise per Session: Not on file  Stress:   . Feeling of Stress : Not on file  Social Connections:   . Frequency of Communication with Friends and Family: Not on file  . Frequency of Social Gatherings with Friends and Family: Not on file  . Attends Religious Services: Not on file  . Active Member of Clubs or Organizations: Not on file  . Attends Banker Meetings: Not on file  . Marital Status: Not on file  Intimate Partner Violence:   . Fear of Current or Ex-Partner: Not on file  . Emotionally Abused: Not on file  . Physically Abused: Not on file  . Sexually Abused: Not on file    Outpatient Medications Prior to Visit  Medication Sig Dispense Refill  . ADDERALL XR 20 MG 24 hr capsule Take 20 mg by mouth  every morning.    Marland Kitchen ALPRAZolam (XANAX) 0.25 MG tablet Take 0.25 mg by mouth at bedtime as needed.    Marland Kitchen buPROPion (WELLBUTRIN XL) 150 MG 24 hr tablet Take 150 mg by mouth.    Marland Kitchen FLUoxetine (PROZAC) 20 MG capsule Take 60 mg by mouth daily.   1  . hydrochlorothiazide (HYDRODIURIL) 25 MG tablet hydrochlorothiazide 25 mg tablet  TK 1 T PO QD    . lamoTRIgine (LAMICTAL) 200 MG tablet lamotrigine 200 mg tablet  TAKE 1 TABLET BY MOUTH EVERY DAY    . levothyroxine (SYNTHROID) 50 MCG tablet TAKE 1 TABLET(50 MCG) BY MOUTH DAILY BEFORE BREAKFAST 90 tablet 3  . Omega-3 Fatty Acids (FISH OIL) 1000 MG CAPS Take 4 capsules (4,000 mg total) by mouth daily. 120 capsule 11  . albuterol (VENTOLIN HFA) 108 (90 Base) MCG/ACT inhaler Inhale 2 puffs into the lungs every 6 (six) hours as needed for  wheezing or shortness of breath. 16 g 0  . chlorpheniramine-HYDROcodone (TUSSIONEX) 10-8 MG/5ML SUER Take 5 mLs by mouth every 12 (twelve) hours as needed. 60 mL 0  . fluticasone (FLONASE) 50 MCG/ACT nasal spray Place 2 sprays into both nostrils daily. 16 g 6  . predniSONE (DELTASONE) 50 MG tablet One tab PO daily for 5 days. 5 tablet 0   No facility-administered medications prior to visit.    Allergies  Allergen Reactions  . Labetalol     Numbness and tingling/jerking/aphasia  . Latex Hives  . Shellfish Allergy Anaphylaxis      Objective:    Physical Exam Vitals and nursing note reviewed.  Constitutional:      Appearance: Normal appearance.  HENT:     Head: Normocephalic.  Eyes:     Extraocular Movements: Extraocular movements intact.     Conjunctiva/sclera: Conjunctivae normal.     Pupils: Pupils are equal, round, and reactive to light.  Neck:     Vascular: No carotid bruit.  Cardiovascular:     Rate and Rhythm: Normal rate and regular rhythm.     Pulses: Normal pulses.     Heart sounds: Normal heart sounds.  Pulmonary:     Effort: Pulmonary effort is normal.     Breath sounds: Normal breath sounds.  Abdominal:     General: Abdomen is flat. Bowel sounds are normal.     Palpations: Abdomen is soft.  Musculoskeletal:        General: Normal range of motion.     Cervical back: Normal range of motion and neck supple.     Right lower leg: No edema.     Left lower leg: No edema.  Skin:    General: Skin is warm and dry.     Capillary Refill: Capillary refill takes less than 2 seconds.     Findings: Lesion present.       Neurological:     General: No focal deficit present.     Mental Status: She is alert and oriented to person, place, and time.  Psychiatric:        Mood and Affect: Mood normal.        Behavior: Behavior normal.        Thought Content: Thought content normal.        Judgment: Judgment normal.     BP 122/85   Pulse (!) 102   Ht 5\' 3"  (1.6 m)    Wt 127 lb (57.6 kg)   SpO2 100%   BMI 22.50 kg/m  Wt Readings from Last 3  Encounters:  11/19/19 127 lb (57.6 kg)  09/24/19 131 lb (59.4 kg)  02/24/19 131 lb (59.4 kg)    Health Maintenance Due  Topic Date Due  . Hepatitis C Screening  Never done    There are no preventive care reminders to display for this patient.   Lab Results  Component Value Date   TSH 2.19 03/18/2019   Lab Results  Component Value Date   WBC 3.6 (L) 06/18/2016   HGB 13.7 06/18/2016   HCT 41.7 06/18/2016   MCV 92.1 06/18/2016   PLT 355 06/18/2016   Lab Results  Component Value Date   NA 137 06/09/2018   K 4.6 06/09/2018   CO2 29 06/09/2018   GLUCOSE 81 06/09/2018   BUN 15 06/09/2018   CREATININE 0.92 06/09/2018   BILITOT 0.5 07/22/2018   ALKPHOS 78 02/20/2016   AST 37 (H) 07/22/2018   ALT 31 (H) 07/22/2018   PROT 6.7 07/22/2018   ALBUMIN 4.8 02/20/2016   CALCIUM 9.9 06/09/2018   ANIONGAP 10 07/28/2015   Lab Results  Component Value Date   CHOL 239 (H) 06/09/2018   Lab Results  Component Value Date   HDL 94 06/09/2018   Lab Results  Component Value Date   LDLCALC 118 (H) 06/09/2018   Lab Results  Component Value Date   TRIG 156 (H) 06/09/2018   Lab Results  Component Value Date   CHOLHDL 2.5 06/09/2018   No results found for: HGBA1C     Assessment & Plan:   Problem List Items Addressed This Visit      Musculoskeletal and Integument   Tinea corporis - Primary    Symptoms and presentation consistent with tinea corporis with 4-5 annular lesions noted in various locations on the body.  The lesions are relatively small and do not appear to have a systemic effect to them therefore will defer treatment with oral antifungals at this time. Recommend daily showering with over-the-counter ketoconazole 1% shampoo to hair and body followed by application of ketoconazole 2% cream to affected areas. Did discuss with the patient if the 2% ketoconazole cream is expensive even with  insurance she can opt to try over-the-counter clotrimazole to see if this will work with the ketoconazole shampoo.   If topical treatments are not effective oral treatments may be necessary. Follow-up if symptoms worsen or fail to improve.      Relevant Medications   ketoconazole (NIZORAL) 2 % cream       Meds ordered this encounter  Medications  . ketoconazole (NIZORAL) 2 % cream    Sig: Apply 1 application topically daily.    Dispense:  30 g    Refill:  0    Follow-up if symptoms worsen or fail to improve. Tollie Eth, NP

## 2019-11-19 NOTE — Patient Instructions (Addendum)
I recommend you use Nizoral Shampoo on your body every day for 7 days then once to twice a week for 2 weeks.   If the ketoconazole cream is expensive you can try Clotrimazole cream over the counter to see if this helps.   Body Ringworm Body ringworm is an infection of the skin that often causes a ring-shaped rash. Body ringworm is also called tinea corporis. Body ringworm can affect any part of your skin. This condition is easily spread from person to person (is very contagious). What are the causes? This condition is caused by fungi called dermatophytes. The condition develops when these fungi grow out of control on the skin. You can get this condition if you touch a person or animal that has it. You can also get it if you share any items with an infected person or pet. These include:  Clothing, bedding, and towels.  Brushes or combs.  Gym equipment.  Any other object that has the fungus on it. What increases the risk? You are more likely to develop this condition if you:  Play sports that involve close physical contact, such as wrestling.  Sweat a lot.  Live in areas that are hot and humid.  Use public showers.  Have a weakened immune system. What are the signs or symptoms? Symptoms of this condition include:  Itchy, raised red spots and bumps.  Red scaly patches.  A ring-shaped rash. The rash may have: ? A clear center. ? Scales or red bumps at its center. ? Redness near its borders. ? Dry and scaly skin on or around it. How is this diagnosed? This condition can usually be diagnosed with a skin exam. A skin scraping may be taken from the affected area and examined under a microscope to see if the fungus is present. How is this treated? This condition may be treated with:  An antifungal cream or ointment.  An antifungal shampoo.  Antifungal medicines. These may be prescribed if your ringworm: ? Is severe. ? Keeps coming back. ? Lasts a long time. Follow these  instructions at home:  Take over-the-counter and prescription medicines only as told by your health care provider.  If you were given an antifungal cream or ointment: ? Use it as told by your health care provider. ? Wash the infected area and dry it completely before applying the cream or ointment.  If you were given an antifungal shampoo: ? Use it as told by your health care provider. ? Leave the shampoo on your body for 3-5 minutes before rinsing.  While you have a rash: ? Wear loose clothing to stop clothes from rubbing and irritating it. ? Wash or change your bed sheets every night. ? Disinfect or throw out items that may be infected. ? Wash clothes and bed sheets in hot water. ? Wash your hands often with soap and water. If soap and water are not available, use hand sanitizer.  If your pet has the same infection, take your pet to see a veterinarian for treatment. How is this prevented?  Take a bath or shower every day and after every time you work out or play sports.  Dry your skin completely after bathing.  Wear sandals or shoes in public places and showers.  Change your clothes every day.  Wash athletic clothes after each use.  Do not share personal items with others.  Avoid touching red patches of skin on other people.  Avoid touching pets that have bald spots.  If you  touch an animal that has a bald spot, wash your hands. Contact a health care provider if:  Your rash continues to spread after 7 days of treatment.  Your rash is not gone in 4 weeks.  The area around your rash gets red, warm, tender, and swollen. Summary  Body ringworm is an infection of the skin that often causes a ring-shaped rash.  This condition is easily spread from person to person (is very contagious).  This condition may be treated with antifungal cream or ointment, antifungal shampoo, or antifungal medicines.  Take over-the-counter and prescription medicines only as told by your  health care provider. This information is not intended to replace advice given to you by your health care provider. Make sure you discuss any questions you have with your health care provider. Document Revised: 10/31/2017 Document Reviewed: 10/31/2017 Elsevier Patient Education  2020 [employer].

## 2019-11-19 NOTE — Assessment & Plan Note (Signed)
Symptoms and presentation consistent with tinea corporis with 4-5 annular lesions noted in various locations on the body.  The lesions are relatively small and do not appear to have a systemic effect to them therefore will defer treatment with oral antifungals at this time. Recommend daily showering with over-the-counter ketoconazole 1% shampoo to hair and body followed by application of ketoconazole 2% cream to affected areas. Did discuss with the patient if the 2% ketoconazole cream is expensive even with insurance she can opt to try over-the-counter clotrimazole to see if this will work with the ketoconazole shampoo.   If topical treatments are not effective oral treatments may be necessary. Follow-up if symptoms worsen or fail to improve.

## 2019-11-23 DIAGNOSIS — M62830 Muscle spasm of back: Secondary | ICD-10-CM | POA: Diagnosis not present

## 2019-11-23 DIAGNOSIS — M545 Low back pain: Secondary | ICD-10-CM | POA: Diagnosis not present

## 2019-11-23 DIAGNOSIS — F4322 Adjustment disorder with anxiety: Secondary | ICD-10-CM | POA: Diagnosis not present

## 2019-11-23 DIAGNOSIS — M542 Cervicalgia: Secondary | ICD-10-CM | POA: Diagnosis not present

## 2019-11-26 DIAGNOSIS — M62830 Muscle spasm of back: Secondary | ICD-10-CM | POA: Diagnosis not present

## 2019-11-26 DIAGNOSIS — M545 Low back pain: Secondary | ICD-10-CM | POA: Diagnosis not present

## 2019-11-26 DIAGNOSIS — M542 Cervicalgia: Secondary | ICD-10-CM | POA: Diagnosis not present

## 2019-11-30 DIAGNOSIS — F4322 Adjustment disorder with anxiety: Secondary | ICD-10-CM | POA: Diagnosis not present

## 2019-12-01 DIAGNOSIS — M542 Cervicalgia: Secondary | ICD-10-CM | POA: Diagnosis not present

## 2019-12-01 DIAGNOSIS — F331 Major depressive disorder, recurrent, moderate: Secondary | ICD-10-CM | POA: Diagnosis not present

## 2019-12-01 DIAGNOSIS — F411 Generalized anxiety disorder: Secondary | ICD-10-CM | POA: Diagnosis not present

## 2019-12-01 DIAGNOSIS — M62830 Muscle spasm of back: Secondary | ICD-10-CM | POA: Diagnosis not present

## 2019-12-01 DIAGNOSIS — M545 Low back pain: Secondary | ICD-10-CM | POA: Diagnosis not present

## 2019-12-06 ENCOUNTER — Encounter: Payer: Self-pay | Admitting: Physician Assistant

## 2019-12-06 ENCOUNTER — Telehealth (INDEPENDENT_AMBULATORY_CARE_PROVIDER_SITE_OTHER): Payer: BLUE CROSS/BLUE SHIELD | Admitting: Physician Assistant

## 2019-12-06 VITALS — Ht 63.0 in | Wt 127.0 lb

## 2019-12-06 DIAGNOSIS — Z7189 Other specified counseling: Secondary | ICD-10-CM

## 2019-12-06 DIAGNOSIS — J329 Chronic sinusitis, unspecified: Secondary | ICD-10-CM | POA: Diagnosis not present

## 2019-12-06 DIAGNOSIS — J40 Bronchitis, not specified as acute or chronic: Secondary | ICD-10-CM | POA: Diagnosis not present

## 2019-12-06 DIAGNOSIS — J0101 Acute recurrent maxillary sinusitis: Secondary | ICD-10-CM | POA: Diagnosis not present

## 2019-12-06 DIAGNOSIS — Z7185 Encounter for immunization safety counseling: Secondary | ICD-10-CM

## 2019-12-06 MED ORDER — AZITHROMYCIN 250 MG PO TABS: ORAL_TABLET | ORAL | 0 refills | Status: DC

## 2019-12-06 NOTE — Progress Notes (Signed)
Started Wednesday last week Productive cough  No sore throat, no fevers, no other symptoms  Taking Tessalon pearls, no other meds (no mucinex or allergy meds)

## 2019-12-06 NOTE — Progress Notes (Signed)
Patient ID: Melissa Sharp, female   DOB: 1980-08-27, 39 y.o.   MRN: [id number] .Marland KitchenVirtual Visit via Telephone Note  I connected with Melissa Sharp on 12/06/19 at  1:00 PM EDT by telephone and verified that I am speaking with the correct person using two identifiers.  Location: Patient: home Provider: clinic   I discussed the limitations, risks, security and privacy concerns of performing an evaluation and management service by telephone and the availability of in person appointments. I also discussed with the patient that there may be a patient responsible charge related to this service. The patient expressed understanding and agreed to proceed.   History of Present Illness: Patient is a 39 year old female with history of recurrent sinobronchitis and maxillary sinusitis who presents to the clinic with chest congestion, cough, sinus pressure for the last 6 days.  She is taking Tessalon Perles, Tylenol Cold sinus severe, Nettie pot with little relief.  She does not have any Covid contacts.  She works from home.  She denies any sick contacts.  She denies any shortness of breath, loss of smell or taste, GI symptoms.  She does not have the vaccine.  She does wonder if she should get the vaccine.   .. Active Ambulatory Problems    Diagnosis Date Noted  . IBS (irritable bowel syndrome) 10/27/2013  . Thyroid activity decreased 10/27/2013  . Migraine without aura, without mention of intractable migraine without mention of status migrainosus 10/29/2013  . Esophageal reflux 10/29/2013  . Depression, major, recurrent (HCC) 10/29/2013  . Mixed anxiety depressive disorder 10/29/2013  . B12 deficiency 10/29/2013  . Hypertriglyceridemia 10/29/2013  . Elevated liver enzymes 11/02/2013  . Poor venous access 01/24/2015  . Post concussive syndrome 05/21/2015  . Cervical pain (neck) 06/26/2015  . Intractable migraine 06/30/2015  . Migraine with status migrainosus 07/15/2015  . PIH (pregnancy induced  hypertension) 07/28/2015  . Diastasis recti 02/20/2016  . Low back pain with radiation, unspecified laterality 02/23/2016  . Overweight (BMI 25.0-29.9) 03/20/2016  . Elevated blood pressure reading 05/03/2016  . Tachycardia with heart rate 100-120 beats per minute 02/25/2017  . Tapeworm infection 02/25/2017  . Non-seasonal allergic rhinitis 05/08/2017  . Anxiety 05/08/2017  . Functional diarrhea 07/01/2017  . Right upper quadrant guarding 07/01/2017  . Bloating 07/01/2017  . Nausea 07/01/2017  . Cervical radiculopathy 03/15/2018  . Muscle weakness of left upper extremity 03/15/2018  . Hypertension goal BP (blood pressure) < 130/80 03/15/2018  . Influenza-like illness 05/22/2018  . Annular skin lesion 06/25/2018  . Chalazion left upper eyelid 08/26/2018  . Sinobronchitis 09/24/2019  . Tinea corporis 11/19/2019   Resolved Ambulatory Problems    Diagnosis Date Noted  . Pregnancy 01/24/2015  . Viral gastroenteritis 01/24/2015  . Nausea and vomiting during pregnancy 03/07/2015  . Dizziness 03/07/2015  . Dehydration 03/07/2015  . Musculoskeletal neck pain 06/26/2015   Past Medical History:  Diagnosis Date  . Depression   . Hypothyroidism   . Migraine   . Preeclampsia    Reviewed med, allergy, problem list.    Observations/Objective: No acute distress Productive cough  .Marland Kitchen Today's Vitals   12/06/19 1301  Weight: 127 lb (57.6 kg)  Height: 5\' 3"  (1.6 m)   Body mass index is 22.5 kg/m.    Assessment and Plan: Marland KitchenDiagnoses and all orders for this visit:  Sinobronchitis -     azithromycin (ZITHROMAX Z-PAK) 250 MG tablet; Take 2 tablets (500 mg) on  Day 1,  followed by 1 tablet (250 mg) once daily on  Days 2 through 5.  Vaccine counseling  Acute recurrent maxillary sinusitis   Patient does not have any overwhelming Covid symptoms.  She has been present with symptoms for 6 days.  She says these feel like her seasonal sinobronchitis.  She does have some albuterol at  home.  Instructed her to use this.  Added Z-Pak.  Rest and hydrate.  Consider adding Flonase.  Strongly urged patient after finishing this antibiotic to get the Covid vaccination.  She questions if she is going to get pregnant.  I would recommend get the Covid vaccine before pregnancy since less studies have been done on this.  However if she were to become pregnant I would recommend to get the vaccine as I have seen very bad outcomes with patient to get Covid and pregnant without being vaccinated.    Follow Up Instructions:    I discussed the assessment and treatment plan with the patient. The patient was provided an opportunity to ask questions and all were answered. The patient agreed with the plan and demonstrated an understanding of the instructions.   The patient was advised to call back or seek an in-person evaluation if the symptoms worsen or if the condition fails to improve as anticipated.  I provided 15 minutes of non-face-to-face time during this encounter.   Tandy Gaw, PA-C

## 2019-12-09 DIAGNOSIS — M62830 Muscle spasm of back: Secondary | ICD-10-CM | POA: Diagnosis not present

## 2019-12-09 DIAGNOSIS — M542 Cervicalgia: Secondary | ICD-10-CM | POA: Diagnosis not present

## 2019-12-09 DIAGNOSIS — N979 Female infertility, unspecified: Secondary | ICD-10-CM | POA: Diagnosis not present

## 2019-12-09 DIAGNOSIS — N941 Unspecified dyspareunia: Secondary | ICD-10-CM | POA: Diagnosis not present

## 2019-12-09 DIAGNOSIS — M545 Low back pain: Secondary | ICD-10-CM | POA: Diagnosis not present

## 2019-12-16 ENCOUNTER — Other Ambulatory Visit: Payer: Self-pay | Admitting: Obstetrics and Gynecology

## 2019-12-16 DIAGNOSIS — N979 Female infertility, unspecified: Secondary | ICD-10-CM

## 2019-12-17 ENCOUNTER — Telehealth: Payer: Self-pay

## 2019-12-17 MED ORDER — PRAZIQUANTEL 600 MG PO TABS: ORAL_TABLET | ORAL | 0 refills | Status: DC

## 2019-12-17 NOTE — Telephone Encounter (Signed)
Melissa Sharp called and states she has tapeworms. She states her whole family is being treated for tapeworms. She would like treatment. Please advise.

## 2019-12-17 NOTE — Telephone Encounter (Signed)
Sent to the pharmacy once dosing.

## 2019-12-20 NOTE — Telephone Encounter (Signed)
Patient advised.

## 2019-12-23 ENCOUNTER — Ambulatory Visit (INDEPENDENT_AMBULATORY_CARE_PROVIDER_SITE_OTHER): Payer: BLUE CROSS/BLUE SHIELD | Admitting: Nurse Practitioner

## 2019-12-23 ENCOUNTER — Other Ambulatory Visit: Payer: Self-pay

## 2019-12-23 ENCOUNTER — Encounter: Payer: Self-pay | Admitting: Nurse Practitioner

## 2019-12-23 VITALS — BP 132/86 | HR 78 | Ht 63.0 in | Wt 127.0 lb

## 2019-12-23 DIAGNOSIS — R404 Transient alteration of awareness: Secondary | ICD-10-CM

## 2019-12-23 DIAGNOSIS — G4753 Recurrent isolated sleep paralysis: Secondary | ICD-10-CM

## 2019-12-23 DIAGNOSIS — F418 Other specified anxiety disorders: Secondary | ICD-10-CM | POA: Diagnosis not present

## 2019-12-23 DIAGNOSIS — R5382 Chronic fatigue, unspecified: Secondary | ICD-10-CM | POA: Insufficient documentation

## 2019-12-23 DIAGNOSIS — F339 Major depressive disorder, recurrent, unspecified: Secondary | ICD-10-CM
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
## 2019-12-23 NOTE — Assessment & Plan Note (Signed)
Chronic severe fatigue of unknown etiology.  Her symptoms do have me question the possibility of narcolepsy, however, it is also unclear if her fatigue could be brought on by post-ictal states.  I will request a copy of the labwork to be provided.  Referral to neurology for further evaluation and recommendations.  EEG ordered to hopefully be performed by the time she has the neurology appointment so they have time to evaluate.  Recommend contacting the office with any questions or concerns in the meantime.

## 2019-12-23 NOTE — Assessment & Plan Note (Addendum)
History significant for mixed anxiety depressive disorder. She is currently treated with Prozac 20 mg nightly and Lamictal 200 mg during the day. It is unclear if her anxiety and depressive disorder could be exacerbating her recent neurological episodes or if they are completely independent.  The long term use of bupropion does strike a concern that the seizure threshold could have been decreased causing the onset of symptoms, although this is not clear at this time.  Recommend discussing the current symptoms with psychiatry and appreciate their input and perspective into the possibility of overlapping disorders.

## 2019-12-23 NOTE — Assessment & Plan Note (Signed)
Staring episodes coupled with chronic fatigue and unexplained neurological symptoms.  I question the possibility of seizure disorder vs. Basilar migraine vs. Narcolepsy.  We will plan for referral to neurology for further evaluation.  Have ordered EEG to hopefully be performed prior to her visit so that they can evaluate and make recommendation and decisions based on findings.  Information provided to patient on all conditions and encourage contact with any concerns or questions.  Recommend journal of foods, activities, stress level, and alcohol consumption that occur within 24 hours of symptom onset in the future.

## 2019-12-23 NOTE — Patient Instructions (Addendum)
[email or web address]    Narcolepsy Narcolepsy is a neurological disorder that causes people to fall asleep suddenly and without control (have sleep attacks) during the daytime. It is a lifelong disorder. Narcolepsy disrupts the sleep cycle at night, which then causes daytime sleepiness. What are the causes? The cause of narcolepsy is not fully understood, but it may be related to:  Low levels of hypocretin, a chemical (neurotransmitter) in the brain that controls sleep and wake cycles. Hypocretin imbalance may be caused by: ? Abnormal genes that are passed from parent to child (inherited). ? An autoimmune disease in which the body's defense system (immune system) attacks the brain cells that make hypocretin.  Infection, tumor, or injury in the area of the brain that controls sleep.  Exposure to poisons (toxins), such as heavy metals, pesticides, and secondhand smoke. What are the signs or symptoms? Symptoms of this condition include:  Excessive daytime sleepiness. This is the most common symptom and is usually the first symptom you will notice. This may affect your performance at work or school.  Sleep attacks. You may fall asleep in the middle of an activity, especially low-energy activities like reading or watching TV.  Feeling like you cannot think clearly and trouble focusing or remembering things. You may also feel depressed.  Sudden muscle weakness (cataplexy). When this occurs, your speech may become slurred, or your knees may buckle. Cataplexy is usually triggered by surprise, anger, fear, or laughter.  Losing the ability to speak or move (sleep paralysis). This may occur just as you start to fall asleep or wake up. You will be aware of the paralysis. It usually lasts for just a few seconds or minutes.  Seeing, hearing, tasting, smelling, or feeling things that are not real (hallucinations).  Hallucinations may occur with sleep paralysis. They can happen when you are falling asleep, waking up, or dozing.  Trouble staying asleep at night (insomnia) and restless sleep. How is this diagnosed? This condition may be diagnosed based on:  A physical exam to rule out any other problems that may be causing your symptoms. You may be asked to write down your sleeping patterns for several weeks in a sleep diary. This will help your health care provider make a diagnosis.  Sleep studies that measure how well your REM sleep is regulated. These tests also measure your heart rate, breathing, movement, and brain waves. These tests include: ? An overnight sleep study (polysomnogram). ? A daytime sleep study that is done while you take several naps during the day (multiple sleep latency test, MSLT). This test measures how quickly you fall asleep and how quickly you enter REM sleep.  Removal of spinal fluid to measure hypocretin levels. How is this treated? There is no cure for this condition, but treatment can help relieve symptoms. Treatment may include:  Lifestyle and sleeping strategies to help you cope with the condition, such as: ? Exercising regularly. ? Maintaining a regular sleep schedule. ? Avoiding caffeine and large meals before bed.  Medicines. These may include: ? Medicines that help keep you awake and alert (stimulants) to fight daytime sleepiness. ? Medicines that treat depression (antidepressants). These may be used to treat cataplexy. ? Sodium oxybate. This is a strong medicine to help you relax (sedative) that you may take at night. It can help control daytime sleepiness and cataplexy.  Other treatments may include mental health counseling or joining a support group. Follow these instructions at home: Sleeping habits   Get about 8 hours of sleep every  night.  Go to sleep and get up at about the same time every day.  Keep your bedroom dark, quiet, and comfortable.  When  you feel very tired, take short naps. Schedule naps so that you take them at about the same time every day.  Before bedtime: ? Avoid bright lights and screens. ? Relax. Try activities like reading or taking a warm bath. Activity  Get at least 20 minutes of exercise every day. This will help you sleep better at night and reduce daytime sleepiness.  Avoid exercising within 3 hours of bedtime.  Do not drive or use heavy machinery if you are sleepy. If possible, take a nap before driving.  Do not swim or go out on the water without a life jacket. Eating and drinking  Do not drink alcohol or caffeinated beverages within 4-5 hours of bedtime.  Do not eat a large meal before bedtime.  Eat meals at about the same times every day. General instructions   Take over-the-counter and prescription medicines only as told by your health care provider.  Keep a sleep diary as told by your health care provider.  Tell your employer or teachers that you have narcolepsy. You may be able to adjust your schedule to include time for naps.  Do not use any products that contain nicotine or tobacco, such as cigarettes, e-cigarettes, and chewing tobacco. If you need help quitting, ask your health care provider.  Keep all follow-up visits as told by your health care provider. This is important. Where to find more information  [employer]: [employer].co.ukwww.ninds.nih.gov Contact a health care provider if:  Your symptoms are not getting better.  You have increasingly high blood pressure (hypertension).  You have changes in your heart rhythm.  You are having a hard time determining what is real and what is not (psychosis). Get help right away if you:  Hurt yourself during a sleep attack or an attack of cataplexy.  Have chest pain.  Have trouble breathing. These symptoms may represent a serious problem that is an emergency. Do not wait to see if the symptoms will go away. Get  medical help right away. Call your local emergency services (911 in the [place] Do not drive yourself to the hospital. Summary  Narcolepsy is a neurological disorder that causes people to fall asleep suddenly, and without control, during the daytime (sleep attacks). It is a lifelong disorder.  There is no cure for this condition, but treatment can help relieve symptoms.  Go to sleep and get up at about the same time every day. Follow instructions about sleep and activities as told by your health care provider.  Take over-the-counter and prescription medicines only as told by your health care provider. This information is not intended to replace advice given to you by your health care provider. Make sure you discuss any questions you have with your health care provider. Document Revised: 10/14/2018 Document Reviewed: 10/14/2018 Elsevier Patient Education  2020 [employer]    Seizure, Adult A seizure is a sudden burst of abnormal electrical activity in the brain. Seizures usually last from 30 seconds to 2 minutes. The abnormal activity temporarily interrupts normal brain function. A seizure can cause many different symptoms depending on where in the brain it starts. What are the causes? Common causes of this condition include:  Fever or infection.  Brain abnormality, injury, bleeding, or tumor.  Low blood sugar.  Metabolic disorders or other conditions that are passed from parent to child (  are inherited).  Reaction to a substance, such as a drug or a medicine, or suddenly stopping the use of a substance (withdrawal).  Stroke.  Developmental disorders such as autism or cerebral palsy. In some cases, the cause of this condition may not be known. Some people who have a seizure never have another one. Seizures usually do not cause brain damage or permanent problems unless they are prolonged. A person who has repeated seizures over time without a clear cause has a condition called  epilepsy. What increases the risk? You are more likely to develop this condition if you have:  A family history of epilepsy.  Had a tonic-clonic seizure in the past. This is a type of seizure that involves whole-body contraction of muscles and a loss of consciousness.  Autism, cerebral palsy, or other brain disorders.  A history of head trauma, lack of oxygen at birth, or strokes. What are the signs or symptoms? There are many different types of seizures. The symptoms of a seizure vary depending on the type of seizure you have. Examples of symptoms during a seizure include:  Uncontrollable shaking (convulsions).  Stiffening of the body.  Loss of consciousness.  Head nodding.  Staring.  Not responding to sound or touch.  Loss of bladder or bowel control. Some people have symptoms right before a seizure happens (aura) and right after a seizure happens (postictal). Symptoms before a seizure may include:  Fear or anxiety.  Nausea.  Feeling like the room is spinning (vertigo).  A feeling of having seen or heard something before (dj vu).  Odd tastes or smells.  Changes in vision, such as seeing flashing lights or spots. Symptoms after a seizure may include:  Confusion.  Sleepiness.  Headache.  Weakness on one side of the body. How is this diagnosed? This condition may be diagnosed based on:  A description of your symptoms. Video of your seizures can be helpful.  Your medical history.  A physical exam. You may also have tests, including:  Blood tests.  CT scan.  MRI.  Electroencephalogram (EEG). This test measures electrical activity in the brain. An EEG can predict whether seizures will return (recur).  A spinal tap (also called a lumbar puncture). This is the removal and testing of fluid that surrounds the brain and spinal cord. How is this treated? Most seizures will stop on their own in under 5 minutes, and no treatment is needed. Seizures that  last longer than 5 minutes will usually need treatment. Treatment can include:  Medicines given through an IV.  Avoiding known triggers, such as medicines that you take for another condition.  Medicines to treat epilepsy (antiepileptics), if epilepsy caused your seizures.  Surgery to stop seizures, if you have epilepsy that does not respond to medicines. Follow these instructions at home: Medicines  Take over-the-counter and prescription medicines only as told by your health care provider.  Avoid any substances that may prevent your medicine from working properly, such as alcohol. Activity  Do not drive, swim, or do any other activities that would be dangerous if you had another seizure. Wait until your health care provider says it is safe to do them.  If you live in the [place] check with your local DMV (department of motor vehicles) to find out about local driving laws. Each state has specific rules about when you can legally return to driving.  Get enough rest. Lack of sleep can make seizures more likely to occur. Educating others Teach friends and family  what to do if you have a seizure. They should:  Lay you on the ground to prevent a fall.  Cushion your head and body.  Loosen any tight clothing around your neck.  Turn you on your side. If vomiting occurs, this helps keep your airway clear.  Not hold you down. Holding you down will not stop the seizure.  Not put anything into your mouth.  Know whether or not you need emergency care. For example, they should get help right away if you have a seizure that lasts longer than 5 minutes or have several seizures in a row.  Stay with you until you recover.  General instructions  Contact your health care provider each time you have a seizure.  Avoid anything that has ever triggered a seizure for you.  Keep a seizure diary. Record what you remember about each seizure, especially anything that might have triggered the  seizure.  Keep all follow-up visits as told by your health care provider. This is important. Contact a health care provider if:  You have another seizure.  You have seizures more often.  Your seizure symptoms change.  You continue to have seizures with treatment.  You have symptoms of an infection or illness. This might increase your risk of having a seizure. Get help right away if:  You have a seizure that: ? Lasts longer than 5 minutes. ? Is different than previous seizures. ? Leaves you unable to speak or use a part of your body. ? Makes it harder to breathe.  You have: ? A seizure after a head injury. ? Multiple seizures in a row. ? Confusion or a severe headache right after a seizure.  You do not wake up immediately after a seizure.  You injure yourself during a seizure. These symptoms may represent a serious problem that is an emergency. Do not wait to see if the symptoms will go away. Get medical help right away. Call your local emergency services (911 in the [place] Do not drive yourself to the hospital. Summary  Seizures are caused by abnormal electrical activity in the brain. The activity disrupts normal brain function and can cause various symptoms, such as convulsions, abnormal movements, or a change in consciousness.  There are many causes of seizures, including illnesses, medicines, genetic conditions, head injuries, strokes, tumors, substance abuse, or substance withdrawal.  Most seizures will stop on their own in under 5 minutes. Seizures that last longer than 5 minutes are a medical emergency and require immediate treatment.  Many medicines are used to treat seizures. Take over-the-counter and prescription medicines only as told by your health care provider. This information is not intended to replace advice given to you by your health care provider. Make sure you discuss any questions you have with your health care provider. Document Revised: 05/22/2018  Document Reviewed: 05/22/2018 Elsevier Patient Education  2020 Elsevier Inc.    Non-Epileptic Seizures, Adult A seizure can cause:  Involuntary movements, like falling or shaking.  Changes in awareness or consciousness.  Convulsions. These are episodes of uncontrollable, jerking movement caused by sudden, intense tightening (contraction) of the muscles. Epileptic seizures are caused by abnormal electrical activity in the brain. Non-epileptic seizures are different. They are not caused by abnormal electrical signals in your brain. These seizures may look like epileptic seizures, but they are not caused by epilepsy. There are two types of non-epileptic seizures:  Physiologic non-epileptic seizure. This type results from an underlying problem that causes a disruption in your brain's  electrical activity.  Psychogenic non-epileptic seizure. This type results from emotional stress. These seizures are sometimes called pseudoseizures. What are the causes? Causes of physiologic non-epileptic seizures can include:  Sudden drop in blood pressure.  Low blood sugar (glucose).  Low levels of salt (sodium) in your blood.  Low levels of calcium in your blood.  Migraine.  Heart rhythm problems.  Sleep disorders, such as narcolepsy.  Movement disorders, such as Tourette syndrome.  Infection.  Certain medicines.  Drug and alcohol abuse.  Fever. Common causes of psychogenic non-epileptic seizures can include:  Stress.  Emotional trauma.  Sexual or physical abuse.  Major life events, such as divorce or death of a loved one.  Mental health disorders, including anxiety and depression. What are the signs or symptoms? Symptoms of a non-epileptic seizure can be similar to those of an epileptic seizure, which may include:  A change in attention or behavior (altered mental status).  Loss of consciousness or fainting.  Convulsions with rhythmic jerking  movements.  Drooling.  Rapid eye movements.  Grunting.  Loss of bladder control and bowel control.  Bitter taste in the mouth.  Tongue biting. Some people experience unusual sensations (aura) before having a seizure. These can include:  "Butterflies" in the stomach.  Abnormal smells or tastes.  A feeling of having had a new experience before (dj vu). After a non-epileptic seizure, you may have a headache or sore muscles or feel confused and sleepy. Non-epileptic seizures usually:  Do not cause physical injuries.  Start slowly.  Include crying or shrieking.  Last longer than 2 minutes.  Include pelvic thrusting. How is this diagnosed? Non-epileptic seizures may be diagnosed by:  Your medical history.  A physical exam.  Your symptoms. ? Your health care provider may want to talk with your friends or relatives who have seen you have a seizure. ? If possible, it is helpful if you write down your seizure activity, including what led up to the seizure, and share that information with your health care provider. You may also need to have tests to look for causes of physiologic non-epileptic seizures. These may include:  An electroencephalogram (EEG). This test measures electrical activity in your brain. If you have had a non-epileptic seizure, the results of your EEG will likely be normal.  Video EEG. This test takes place in the hospital over the course of 2-7 days. The test uses a video camera and an EEG to monitor your symptoms and the electrical activity in your brain.  Blood tests.  Lumbar puncture. This test involves pulling fluid from your spine to check for infection.  Electrocardiogram (ECG or EKG). This test checks for an abnormal heart rhythm.  CT scan. If your health care provider thinks you have had a psychogenic non-epileptic seizure, you may need to see a mental health specialist for an evaluation. How is this treated? The treatment for your seizures  will depend on what is causing them. When the underlying condition is treated, your seizures should stop. If your seizures are being caused by emotional trauma or stress, your health care provider may recommend that you see a mental health professional. Treatment may include:  Relaxation therapy or cognitive behavioral therapy.  Medicines to treat depression or anxiety.  Individual or family counseling. In some cases, you may have psychogenic seizures in addition to epileptic seizures. If this is the case, you may be prescribed medicine to help with the epileptic seizures. Follow these instructions at home: Home care will depend  on the type of non-epileptic seizures that you have. In general:  Follow all instructions from your health care provider. These may include ways to prevent seizures and what to do if you have a seizure.  Take over-the-counter and prescription medicines only as told by your health care provider.  Keep all follow-up visits as told by your health care provider. This is important.  Make sure family members, friends, and coworkers are trained on how to help you if you have a seizure. If you have a seizure, they should: ? Lay you on the ground to prevent a fall. ? Place a pillow or piece of clothing under your head. ? Loosen any clothing around your neck. ? Turn you onto your side. If vomiting occurs, this helps keep your airway clear.  Avoid any substances that may prevent your medicine from working properly. If you are prescribed medicine for seizures: ? Do not use recreational drugs. ? Limit or avoid alcoholic beverages. Contact a health care provider if:  Your seizures change or become more frequent.  You continue to have seizures after treatment. Get help right away if:  You injure yourself during a seizure.  You have one seizure after another.  You have trouble recovering from a seizure.  You have chest pain or trouble breathing.  You have a seizure  that lasts longer than 5 minutes. Summary  Non-epileptic seizures may look like epileptic seizures, but they are not caused by epilepsy.  The treatment for your seizures will depend on what is causing them. When the underlying condition is treated, your seizures should stop.  Make sure family members, friends, and coworkers are trained on how to help you if you have a seizure. If you have a seizure, they should lay you on the ground to prevent a fall, protect your head and neck, and turn you onto your side. This information is not intended to replace advice given to you by your health care provider. Make sure you discuss any questions you have with your health care provider. Document Revised: 02/14/2017 Document Reviewed: 12/15/2015 Elsevier Patient Education  2020 [employer].

## 2019-12-27 ENCOUNTER — Other Ambulatory Visit: Payer: Self-pay | Admitting: Neurology

## 2019-12-27 DIAGNOSIS — E039 Hypothyroidism, unspecified: Secondary | ICD-10-CM

## 2019-12-27 MED ORDER — LEVOTHYROXINE SODIUM 50 MCG PO TABS: ORAL_TABLET | ORAL | 0 refills | Status: DC

## 2019-12-27 NOTE — Progress Notes (Signed)
Patient tried to transfer Levothyroxine from [facility] to [facility] and having no luck. New RX for #90 sent to [facility].

## 2019-12-30 DIAGNOSIS — Z01419 Encounter for gynecological examination (general) (routine) without abnormal findings: Secondary | ICD-10-CM | POA: Diagnosis not present

## 2019-12-30 DIAGNOSIS — Z6823 Body mass index (BMI) 23.0-23.9, adult: Secondary | ICD-10-CM | POA: Diagnosis not present

## 2020-01-03 DIAGNOSIS — M542 Cervicalgia: Secondary | ICD-10-CM | POA: Diagnosis not present

## 2020-01-03 DIAGNOSIS — M62830 Muscle spasm of back: Secondary | ICD-10-CM | POA: Diagnosis not present

## 2020-01-05 ENCOUNTER — Ambulatory Visit: Payer: BLUE CROSS/BLUE SHIELD | Admitting: Physician Assistant

## 2020-01-11 ENCOUNTER — Ambulatory Visit
Admission: RE | Admit: 2020-01-11 | Discharge: 2020-01-11 | Disposition: A | Discharge status: Home / Self Care | Payer: BLUE CROSS/BLUE SHIELD | Source: Ambulatory Visit | Attending: Obstetrics and Gynecology | Admitting: Obstetrics and Gynecology

## 2020-01-11 DIAGNOSIS — N972 Female infertility of uterine origin: Secondary | ICD-10-CM | POA: Diagnosis not present

## 2020-01-11 DIAGNOSIS — N979 Female infertility, unspecified: Secondary | ICD-10-CM

## 2020-01-19 DIAGNOSIS — F410 Panic disorder [episodic paroxysmal anxiety] without agoraphobia: Secondary | ICD-10-CM | POA: Diagnosis not present

## 2020-01-19 DIAGNOSIS — F900 Attention-deficit hyperactivity disorder, predominantly inattentive type: Secondary | ICD-10-CM | POA: Diagnosis not present

## 2020-01-19 DIAGNOSIS — F4310 Post-traumatic stress disorder, unspecified: Secondary | ICD-10-CM | POA: Diagnosis not present

## 2020-01-19 DIAGNOSIS — F3341 Major depressive disorder, recurrent, in partial remission: Secondary | ICD-10-CM | POA: Diagnosis not present

## 2020-02-01 DIAGNOSIS — M62830 Muscle spasm of back: Secondary | ICD-10-CM | POA: Diagnosis not present

## 2020-02-01 DIAGNOSIS — M542 Cervicalgia: Secondary | ICD-10-CM | POA: Diagnosis not present

## 2020-02-08 DIAGNOSIS — M542 Cervicalgia: Secondary | ICD-10-CM | POA: Diagnosis not present

## 2020-02-08 DIAGNOSIS — M62830 Muscle spasm of back: Secondary | ICD-10-CM | POA: Diagnosis not present

## 2020-03-02 DIAGNOSIS — M542 Cervicalgia: Secondary | ICD-10-CM | POA: Diagnosis not present

## 2020-03-02 DIAGNOSIS — M62830 Muscle spasm of back: Secondary | ICD-10-CM | POA: Diagnosis not present

## 2020-03-14 ENCOUNTER — Ambulatory Visit: Payer: BLUE CROSS/BLUE SHIELD | Admitting: Physician Assistant

## 2020-03-15 ENCOUNTER — Ambulatory Visit: Payer: BLUE CROSS/BLUE SHIELD | Admitting: Physician Assistant

## 2020-03-15 ENCOUNTER — Telehealth: Payer: Self-pay | Admitting: Physician Assistant

## 2020-03-15 NOTE — Telephone Encounter (Signed)
Ok to cancel as so she is not charged

## 2020-03-15 NOTE — Telephone Encounter (Signed)
Pt called at 215 to cancel thinking her appt was 240. Daughter had just been tested for covid.

## 2020-03-20 ENCOUNTER — Ambulatory Visit: Payer: BLUE CROSS/BLUE SHIELD | Admitting: Physician Assistant

## 2020-03-22 ENCOUNTER — Telehealth (INDEPENDENT_AMBULATORY_CARE_PROVIDER_SITE_OTHER): Payer: Self-pay | Admitting: Sports Medicine

## 2020-03-22 DIAGNOSIS — R109 Unspecified abdominal pain: Secondary | ICD-10-CM

## 2020-03-22 MED ORDER — ONDANSETRON 8 MG PO TBDP: 8.0000 mg | ORAL_TABLET | Freq: Three times a day (TID) | ORAL | 3 refills | Status: DC | PRN

## 2020-03-22 NOTE — Progress Notes (Signed)
   Virtual Visit via Telephone   I connected with  Melissa Sharp  on 03/22/20 by telephone/telehealth and verified that I am speaking with the correct person using two identifiers.   I discussed the limitations, risks, security and privacy concerns of performing an evaluation and management service by telephone, including the higher likelihood of inaccurate diagnosis and treatment, and the availability of in person appointments.  We also discussed the likely need of an additional face to face encounter for complete and high quality delivery of care.  I also discussed with the patient that there may be a patient responsible charge related to this service. The patient expressed understanding and wishes to proceed.  Provider location is in medical facility. Patient location is at their home, different from provider location. People involved in care of the patient during this telehealth encounter were myself, my nurse/medical assistant, and my front office/scheduling team member.  Review of Systems: No fevers, chills, night sweats, weight loss, chest pain, or shortness of breath.   Objective Findings:    General: Speaking full sentences, no audible heavy breathing.  Sounds alert and appropriately interactive.    Independent interpretation of tests performed by another provider:   None.  Brief History, Exam, Impression, and Recommendations:    Acute abdominal pain Melissa Sharp is a 40 year old female, she is having acute abdominal pain, fevers, chills, muscle aches, body aches, nausea, vomiting, and diarrhea. Very few respiratory symptoms, no obvious or known Covid exposures, she is Covid vaccinated. Unfortunately this was placed in a telephone visit, we cannot diagnose or manage acute abdominal pain over the phone. Adding some Zofran to help her symptoms, she will get a home Covid test and if negative would like her to come back sometime this morning or this early afternoon for a physical exam and  potential IV fluids and medication. If she cannot get a home Covid test then I would advise her to go to the emergency department.   I discussed the above assessment and treatment plan with the patient. The patient was provided an opportunity to ask questions and all were answered. The patient agreed with the plan and demonstrated an understanding of the instructions.   The patient was advised to call back or seek an in-person evaluation if the symptoms worsen or if the condition fails to improve as anticipated.   I provided 30 minutes of face to face and non-face-to-face time during this encounter date, time was needed to gather information, review chart, records, communicate/coordinate with staff remotely, as well as complete documentation.   ___________________________________________ Ihor Austin. Benjamin Stain, M.D., ABFM., CAQSM. Primary Care and Sports Medicine [facility]  Adjunct Professor of Family Medicine  [employer]

## 2020-03-22 NOTE — Assessment & Plan Note (Addendum)
Melissa Sharp is a 40 year old female, she is having acute abdominal pain, fevers, chills, muscle aches, body aches, nausea, vomiting, and diarrhea. Very few respiratory symptoms, no obvious or known Covid exposures, she is Covid vaccinated. Unfortunately this was placed in a telephone visit, we cannot diagnose or manage acute abdominal pain over the phone. Adding some Zofran to help her symptoms, she will get a home Covid test and if negative would like her to come back sometime this morning or this early afternoon for a physical exam and potential IV fluids and medication. If she cannot get a home Covid test then I would advise her to go to the emergency department.

## 2020-03-22 NOTE — Progress Notes (Signed)
Vomiting and diarrhea since last night. Got pfizer COVID vaccine x 2. But no flu. Not able to keep any fluids in at all. Body aches, chills, sore on tongue.

## 2020-03-28 ENCOUNTER — Other Ambulatory Visit: Payer: Self-pay | Admitting: Physician Assistant

## 2020-03-28 DIAGNOSIS — E039 Hypothyroidism, unspecified: Secondary | ICD-10-CM

## 2020-03-28 MED ORDER — LEVOTHYROXINE SODIUM 50 MCG PO TABS: ORAL_TABLET | ORAL | 0 refills | Status: DC

## 2020-03-28 NOTE — Progress Notes (Signed)
Sent refill

## 2020-04-25 ENCOUNTER — Encounter: Payer: BLUE CROSS/BLUE SHIELD | Admitting: Physician Assistant

## 2020-05-02 ENCOUNTER — Encounter: Payer: Self-pay | Admitting: Physician Assistant

## 2020-05-08 ENCOUNTER — Telehealth: Payer: Self-pay | Admitting: *Deleted

## 2020-05-08 DIAGNOSIS — R5382 Chronic fatigue, unspecified: Secondary | ICD-10-CM

## 2020-05-08 DIAGNOSIS — E039 Hypothyroidism, unspecified: Secondary | ICD-10-CM

## 2020-05-08 DIAGNOSIS — E781 Pure hyperglyceridemia: Secondary | ICD-10-CM

## 2020-05-08 DIAGNOSIS — Z131 Encounter for screening for diabetes mellitus: Secondary | ICD-10-CM

## 2020-05-08 DIAGNOSIS — Z13220 Encounter for screening for lipoid disorders: Secondary | ICD-10-CM

## 2020-05-08 NOTE — Telephone Encounter (Signed)
Pt has CPE on Wed and wanted to know if you'd go ahead and place orders for her ahead of time.  Some labs pended for provider review.

## 2020-05-09 NOTE — Telephone Encounter (Signed)
LMOM for patient letting her know lab orders placed.

## 2020-05-09 NOTE — Telephone Encounter (Signed)
Labs ordered and signed. Ok to have drawn.

## 2020-05-10 ENCOUNTER — Encounter: Payer: Self-pay | Admitting: Physician Assistant

## 2020-05-11 LAB — CBC WITH DIFFERENTIAL/PLATELET
Absolute Monocytes: 326 cells/uL (ref 200–950)
Basophils Absolute: 41 cells/uL (ref 0–200)
Basophils Relative: 0.8 %
Eosinophils Absolute: 107 cells/uL (ref 15–500)
Eosinophils Relative: 2.1 %
HCT: 38.4 % (ref 35.0–45.0)
Hemoglobin: 13.2 g/dL (ref 11.7–15.5)
Lymphs Abs: 1301 cells/uL (ref 850–3900)
MCH: 30.6 pg (ref 27.0–33.0)
MCHC: 34.4 g/dL (ref 32.0–36.0)
MCV: 89.1 fL (ref 80.0–100.0)
MPV: 10.3 fL (ref 7.5–12.5)
Monocytes Relative: 6.4 %
Neutro Abs: 3325 cells/uL (ref 1500–7800)
Neutrophils Relative %: 65.2 %
Platelets: 375 10*3/uL (ref 140–400)
RBC: 4.31 10*6/uL (ref 3.80–5.10)
RDW: 13.3 % (ref 11.0–15.0)
Total Lymphocyte: 25.5 %
WBC: 5.1 10*3/uL (ref 3.8–10.8)

## 2020-05-11 LAB — COMPLETE METABOLIC PANEL WITH GFR
AG Ratio: 2 (calc) (ref 1.0–2.5)
ALT: 22 U/L (ref 6–29)
AST: 21 U/L (ref 10–30)
Albumin: 4.8 g/dL (ref 3.6–5.1)
Alkaline phosphatase (APISO): 39 U/L (ref 31–125)
BUN: 18 mg/dL (ref 7–25)
CO2: 28 mmol/L (ref 20–32)
Calcium: 10 mg/dL (ref 8.6–10.2)
Chloride: 101 mmol/L (ref 98–110)
Creat: 0.92 mg/dL (ref 0.50–1.10)
GFR, Est African American: 91 mL/min/{1.73_m2} (ref >=60)
GFR, Est Non African American: 78 mL/min/{1.73_m2} (ref >=60)
Globulin: 2.4 g/dL (calc) (ref 1.9–3.7)
Glucose, Bld: 90 mg/dL (ref 65–99)
Potassium: 4.4 mmol/L (ref 3.5–5.3)
Sodium: 137 mmol/L (ref 135–146)
Total Bilirubin: 0.4 mg/dL (ref 0.2–1.2)
Total Protein: 7.2 g/dL (ref 6.1–8.1)

## 2020-05-11 LAB — LIPID PANEL W/REFLEX DIRECT LDL
Cholesterol: 300 mg/dL — ABNORMAL HIGH (ref <200)
HDL: 112 mg/dL (ref >=50)
LDL Cholesterol (Calc): 133 mg/dL (calc) — ABNORMAL HIGH
Non-HDL Cholesterol (Calc): 188 mg/dL (calc) — ABNORMAL HIGH (ref <130)
Total CHOL/HDL Ratio: 2.7 (calc) (ref <5.0)
Triglycerides: 384 mg/dL — ABNORMAL HIGH (ref <150)

## 2020-05-11 LAB — TSH: TSH: 1.52 mIU/L

## 2020-05-12 ENCOUNTER — Other Ambulatory Visit: Payer: Self-pay | Admitting: Physician Assistant

## 2020-05-12 DIAGNOSIS — E039 Hypothyroidism, unspecified: Secondary | ICD-10-CM

## 2020-05-12 MED ORDER — LEVOTHYROXINE SODIUM 50 MCG PO TABS: ORAL_TABLET | ORAL | 3 refills | Status: DC

## 2020-05-12 NOTE — Telephone Encounter (Signed)
Melissa Sharp,   Your kidney, liver, glucose look great.  Thyroid perfect.  No anemia.  LDL up from 1 year ago.  HDL amazing.  Your TG are up as well. Are you taking fish oil? Work on diet changes for TG levels. We could consider after done having kids starting medications to help lower this.

## 2020-05-15 ENCOUNTER — Ambulatory Visit: Payer: Self-pay | Admitting: Physician Assistant

## 2020-05-15 DIAGNOSIS — Z5329 Procedure and treatment not carried out because of patient's decision for other reasons: Secondary | ICD-10-CM

## 2020-05-23 ENCOUNTER — Ambulatory Visit: Payer: Self-pay | Admitting: Physician Assistant

## 2020-05-23 ENCOUNTER — Encounter: Payer: Self-pay | Admitting: Physician Assistant

## 2020-05-23 DIAGNOSIS — Z5329 Procedure and treatment not carried out because of patient's decision for other reasons: Secondary | ICD-10-CM

## 2020-05-23 NOTE — Progress Notes (Signed)
No show

## 2020-05-29 ENCOUNTER — Other Ambulatory Visit: Payer: Self-pay

## 2020-05-29 ENCOUNTER — Encounter: Payer: Self-pay | Admitting: Physician Assistant

## 2020-05-29 ENCOUNTER — Ambulatory Visit (INDEPENDENT_AMBULATORY_CARE_PROVIDER_SITE_OTHER): Payer: 59 | Admitting: Physician Assistant

## 2020-05-29 VITALS — BP 137/88 | HR 96 | Ht 63.0 in | Wt 135.0 lb

## 2020-05-29 DIAGNOSIS — N979 Female infertility, unspecified: Secondary | ICD-10-CM

## 2020-05-29 DIAGNOSIS — F418 Other specified anxiety disorders: Secondary | ICD-10-CM

## 2020-05-29 DIAGNOSIS — R030 Elevated blood-pressure reading, without diagnosis of hypertension: Secondary | ICD-10-CM | POA: Diagnosis not present

## 2020-05-29 DIAGNOSIS — F902 Attention-deficit hyperactivity disorder, combined type: Secondary | ICD-10-CM

## 2020-05-29 DIAGNOSIS — F419 Anxiety disorder, unspecified: Secondary | ICD-10-CM | POA: Diagnosis not present

## 2020-05-29 DIAGNOSIS — Z0000 Encounter for general adult medical examination without abnormal findings: Secondary | ICD-10-CM

## 2020-05-29 NOTE — Progress Notes (Signed)
Subjective:     Melissa Sharp is a 39 y.o. female and is here for a comprehensive physical exam. The patient reports problems - continues to have problems with fertility and her anxiety/mood/focus.    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 She struggles with time constraints and decision making and social interaction.      Social History   Socioeconomic History  . Marital status: Married    Spouse name: Melissa Sharp  . Number of children: 0  . Years of education: Not on file  . Highest education level: Not on file  Occupational History  . Occupation: Designer, television/film set  Tobacco Use  . Smoking status: Never Smoker  . Smokeless tobacco: Never Used  Substance and Sexual Activity  . Alcohol use: Yes    Comment: 5 drinks a week  . Drug use: No  . Sexual activity: Yes    Birth control/protection: None  Other Topics Concern  . Not on file  Social History Narrative   Lives w/ husband   Social Determinants of Health   Financial Resource Strain: Not on file  Food Insecurity: Not on file  Transportation Needs: Not on file  Physical Activity: Not on file  Stress: Not on file  Social Connections: Not on file  Intimate Partner Violence: Not on file   Health Maintenance  Topic Date Due  . INFLUENZA VACCINE  06/15/2020 (Originally 10/17/2019)  . Hepatitis C Screening  05/29/2021 (Originally 26-Sep-1980)  . COVID-19 Vaccine (3 - Booster for Pfizer series) 06/29/2020  . PAP SMEAR-Modifier  03/26/2021  . TETANUS/TDAP  10/28/2023  . HPV VACCINES  Completed  . HIV Screening  Completed    The following portions of the patient's history were reviewed and updated as appropriate: allergies, current medications, past family history, past medical history, past social history, past surgical history and problem list.  Review of Systems Pertinent items noted in HPI and remainder of comprehensive ROS otherwise negative.   Objective:    BP 137/88   Pulse 96   Ht  5\' 3"  (1.6 m)   Wt 135 lb (61.2 kg)   SpO2 100%   BMI 23.91 kg/m  General appearance: alert, cooperative and appears stated age Head: Normocephalic, without obvious abnormality, atraumatic Eyes: conjunctivae/corneas clear. PERRL, EOM's intact. Fundi benign. Ears: normal TM's and external ear canals both ears Nose: Nares normal. Septum midline. Mucosa normal. No drainage or sinus tenderness. Throat: lips, mucosa, and tongue normal; teeth and gums normal Neck: no adenopathy, no carotid bruit, no JVD, supple, symmetrical, trachea midline and thyroid not enlarged, symmetric, no tenderness/mass/nodules Back: symmetric, no curvature. ROM normal. No CVA tenderness. Lungs: clear to auscultation bilaterally Heart: regular rate and rhythm, S1, S2 normal, no murmur, click, rub or gallop Abdomen: soft, non-tender; bowel sounds normal; no masses,  no organomegaly Extremities: extremities normal, atraumatic, no cyanosis or edema Pulses: 2+ and symmetric Skin: Skin color, texture, turgor normal. No rashes or lesions Lymph nodes: Cervical, supraclavicular, and axillary nodes normal. Neurologic: Alert and oriented X 3, normal strength and tone. Normal symmetric reflexes. Normal coordination and gait   . Depression screen [facility] 2/9 05/29/2020 07/22/2018 04/03/2018 05/07/2017  Decreased Interest 2 2 0 0  Down, Depressed, Hopeless 0 2 0 0  PHQ - 2 Score 2 4 0 0  Altered sleeping 2 2 2  -  Tired, decreased energy 3 2 2  -  Change in appetite 0 0 0 -  Feeling bad or failure about yourself  3 0 0 -  Trouble concentrating 2 0 0 -  Moving slowly or fidgety/restless 1 0 0 -  Suicidal thoughts 0 0 0 -  PHQ-9 Score 13 8 4  -  Difficult doing work/chores Extremely dIfficult Somewhat difficult Somewhat difficult -   .. GAD 7 : Generalized Anxiety Score 05/29/2020 07/22/2018 04/03/2018  Nervous, Anxious, on Edge 3 0 0  Control/stop worrying 2 1 0  Worry too much - different things 2 1 0  Trouble relaxing 2 1 0   Restless 2 0 0  Easily annoyed or irritable 2 0 0  Afraid - awful might happen 0 0 0  Total GAD 7 Score 13 3 0  Anxiety Difficulty Very difficult Somewhat difficult Not difficult at all     Assessment:    Healthy female exam.      Plan:      1/19/2020Marland KitchenMauriana was seen today for annual exam.  Diagnoses and all orders for this visit:  Routine physical examination  Elevated blood pressure reading  Mixed anxiety depressive disorder  Anxiety  Attention deficit hyperactivity disorder (ADHD), combined type  Female fertility problems   .Melissa Sharp Discussed 150 minutes of exercise a week.  Encouraged vitamin D 1000 units and Calcium 1300mg  or 4 servings of dairy a day.  Fasting labs reviewed.  TG elevated. Start fish oil. Discussed diet changes. No medication indicated until finished having children.  Pap UTD. Mammogram after may when 40.  Declined flu and covid vaccines.   Pt's mood is not controlled currently. PHQ/GAD numbers are elevated. Follow up with [facility]. Letter of support for [facility] and disability case. Certainly could be effecting her BP and HR. Please check at home. Certainly could be effecting her fertility.  Follow up in 6 months.   .  See After Visit Summary for Counseling Recommendations

## 2020-05-29 NOTE — Patient Instructions (Addendum)
Health Maintenance, Female Adopting a healthy lifestyle and getting preventive care are important in promoting health and wellness. Ask your health care provider about:  The right schedule for you to have regular tests and exams.  Things you can do on your own to prevent diseases and keep yourself healthy. What should I know about diet, weight, and exercise? Eat a healthy diet  Eat a diet that includes plenty of vegetables, fruits, low-fat dairy products, and lean protein.  Do not eat a lot of foods that are high in solid fats, added sugars, or sodium.   Maintain a healthy weight Body mass index (BMI) is used to identify weight problems. It estimates body fat based on height and weight. Your health care provider can help determine your BMI and help you achieve or maintain a healthy weight. Get regular exercise Get regular exercise. This is one of the most important things you can do for your health. Most adults should:  Exercise for at least 150 minutes each week. The exercise should increase your heart rate and make you sweat (moderate-intensity exercise).  Do strengthening exercises at least twice a week. This is in addition to the moderate-intensity exercise.  Spend less time sitting. Even light physical activity can be beneficial. Watch cholesterol and blood lipids Have your blood tested for lipids and cholesterol at 40 years of age, then have this test every 5 years. Have your cholesterol levels checked more often if:  Your lipid or cholesterol levels are high.  You are older than 40 years of age.  You are at high risk for heart disease. What should I know about cancer screening? Depending on your health history and family history, you may need to have cancer screening at various ages. This may include screening for:  Breast cancer.  Cervical cancer.  Colorectal cancer.  Skin cancer.  Lung cancer. What should I know about heart disease, diabetes, and high blood  pressure? Blood pressure and heart disease  High blood pressure causes heart disease and increases the risk of stroke. This is more likely to develop in people who have high blood pressure readings, are of African descent, or are overweight.  Have your blood pressure checked: ? Every 3-5 years if you are 18-39 years of age. ? Every year if you are 40 years old or older. Diabetes Have regular diabetes screenings. This checks your fasting blood sugar level. Have the screening done:  Once every three years after age 40 if you are at a normal weight and have a low risk for diabetes.  More often and at a younger age if you are overweight or have a high risk for diabetes. What should I know about preventing infection? Hepatitis B If you have a higher risk for hepatitis B, you should be screened for this virus. Talk with your health care provider to find out if you are at risk for hepatitis B infection. Hepatitis C Testing is recommended for:  Everyone born from 1945 through 1965.  Anyone with known risk factors for hepatitis C. Sexually transmitted infections (STIs)  Get screened for STIs, including gonorrhea and chlamydia, if: ? You are sexually active and are younger than 40 years of age. ? You are older than 40 years of age and your health care provider tells you that you are at risk for this type of infection. ? Your sexual activity has changed since you were last screened, and you are at increased risk for chlamydia or gonorrhea. Ask your health care provider   if you are at risk.  Ask your health care provider about whether you are at high risk for HIV. Your health care provider may recommend a prescription medicine to help prevent HIV infection. If you choose to take medicine to prevent HIV, you should first get tested for HIV. You should then be tested every 3 months for as long as you are taking the medicine. Pregnancy  If you are about to stop having your period (premenopausal) and  you may become pregnant, seek counseling before you get pregnant.  Take 400 to 800 micrograms (mcg) of folic acid every day if you become pregnant.  Ask for birth control (contraception) if you want to prevent pregnancy. Osteoporosis and menopause Osteoporosis is a disease in which the bones lose minerals and strength with aging. This can result in bone fractures. If you are 84 years old or older, or if you are at risk for osteoporosis and fractures, ask your health care provider if you should:  Be screened for bone loss.  Take a calcium or vitamin D supplement to lower your risk of fractures.  Be given hormone replacement therapy (HRT) to treat symptoms of menopause. Follow these instructions at home: Lifestyle  Do not use any products that contain nicotine or tobacco, such as cigarettes, e-cigarettes, and chewing tobacco. If you need help quitting, ask your health care provider.  Do not use street drugs.  Do not share needles.  Ask your health care provider for help if you need support or information about quitting drugs. Alcohol use  Do not drink alcohol if: ? Your health care provider tells you not to drink. ? You are pregnant, may be pregnant, or are planning to become pregnant.  If you drink alcohol: ? Limit how much you use to 0-1 drink a day. ? Limit intake if you are breastfeeding.  Be aware of how much alcohol is in your drink. In the [place] one drink equals one 12 oz bottle of beer (355 mL), one 5 oz glass of wine (148 mL), or one 1 oz glass of hard liquor (44 mL). General instructions  Schedule regular health, dental, and eye exams.  Stay current with your vaccines.  Tell your health care provider if: ? You often feel depressed. ? You have ever been abused or do not feel safe at home. Summary  Adopting a healthy lifestyle and getting preventive care are important in promoting health and wellness.  Follow your health care provider's instructions about healthy  diet, exercising, and getting tested or screened for diseases.  Follow your health care provider's instructions on monitoring your cholesterol and blood pressure. This information is not intended to replace advice given to you by your health care provider. Make sure you discuss any questions you have with your health care provider. Document Revised: 02/25/2018 Document Reviewed: 02/25/2018 Elsevier Patient Education  2021 Elsevier Inc.    High Triglycerides Eating Plan Triglycerides are a type of fat in the blood. High levels of triglycerides can increase your risk of heart disease and stroke. If your triglyceride levels are high, choosing the right foods can help lower your triglycerides and keep your heart healthy. Work with your health care provider or a diet and nutrition specialist (dietitian) to develop an eating plan that is right for you. What are tips for following this plan? General guidelines  Lose weight, if you are overweight. For most people, losing 5-10 lbs (2-5 kg) helps lower triglyceride levels. A weight-loss plan may include. ? 30 minutes  exercise at least 5 days a week. ? Reducing the amount of calories, sugar, and fat you eat.  Eat a wide variety of fresh fruits, vegetables, and whole grains. These foods are high in fiber.  Eat foods that contain healthy fats, such as fatty fish, nuts, seeds, and olive oil.  Avoid foods that are high in added sugar, added salt (sodium), saturated fat, and trans fat.  Avoid low-fiber, refined carbohydrates such as white bread, crackers, noodles, and white rice.  Avoid foods with partially hydrogenated oils (trans fats), such as fried foods or stick margarine.  Limit alcohol intake to no more than 1 drink a day for nonpregnant women and 2 drinks a day for men. One drink equals 12 oz of beer, 5 oz of wine, or 1 oz of hard liquor. Your health care provider may recommend that you drink less depending on your overall health.   Reading  food labels  Check food labels for the amount of saturated fat. Choose foods with no or very little saturated fat.  Check food labels for the amount of trans fat. Choose foods with no trans fat.  Check food labels for the amount of cholesterol. Choose foods low in cholesterol. Ask your dietitian how much cholesterol you should have each day.  Check food labels for the amount of sodium. Choose foods with less than 140 milligrams (mg) per serving. Shopping  Buy dairy products labeled as nonfat (skim) or low-fat (1%).  Avoid buying processed or prepackaged foods. These are often high in added sugar, sodium, and fat. Cooking  Choose healthy fats when cooking, such as olive oil or canola oil.  Cook foods using lower fat methods, such as baking, broiling, boiling, or grilling.  Make your own sauces, dressings, and marinades when possible, instead of buying them. Store-bought sauces, dressings, and marinades are often high in sodium and sugar. Meal planning  Eat more home-cooked food and less restaurant, buffet, and fast food.  Eat fatty fish at least 2 times each week. Examples of fatty fish include salmon, trout, mackerel, tuna, and herring.  If you eat whole eggs, do not eat more than 3 egg yolks per week. What foods are recommended? The items listed may not be a complete list. Talk with your dietitian about what dietary choices are best for you. Grains Whole wheat or whole grain breads, crackers, cereals, and pasta. Unsweetened oatmeal. Bulgur. Barley. Quinoa. Brown rice. Whole wheat flour tortillas. Vegetables Fresh or frozen vegetables. Low-sodium canned vegetables. Fruits All fresh, canned (in natural juice), or frozen fruits. Meats and other protein foods Skinless chicken or turkey. Ground chicken or turkey. Lean cuts of pork, trimmed of fat. Fish and seafood, especially salmon, trout, and herring. Egg whites. Dried beans, peas, or lentils. Unsalted nuts or seeds. Unsalted  canned beans. Natural peanut or almond butter. Dairy Low-fat dairy products. Skim or low-fat (1%) milk. Reduced fat (2%) and low-sodium cheese. Low-fat ricotta cheese. Low-fat cottage cheese. Plain, low-fat yogurt. Fats and oils Tub margarine without trans fats. Light or reduced-fat mayonnaise. Light or reduced-fat salad dressings. Avocado. Safflower, olive, sunflower, soybean, and canola oils. What foods are not recommended? The items listed may not be a complete list. Talk with your dietitian about what dietary choices are best for you. Grains White bread. White (regular) pasta. White rice. Cornbread. Bagels. Pastries. Crackers that contain trans fat. Vegetables Creamed or fried vegetables. Vegetables in a cheese sauce. Fruits Sweetened dried fruit. Canned fruit in syrup. Fruit juice. Meats and other protein   protein foods Fatty cuts of meat. Ribs. Chicken wings. Tomasa Blase. Sausage. Bologna. Salami. Chitterlings. Fatback. Hot dogs. Bratwurst. Packaged lunch meats. Dairy Whole or reduced-fat (2%) milk. Half-and-half. Cream cheese. Full-fat or sweetened yogurt. Full-fat cheese. Nondairy creamers. Whipped toppings. Processed cheese or cheese spreads. Cheese curds. Beverages Alcohol. Sweetened drinks, such as soda, lemonade, fruit drinks, or punches. Fats and oils Butter. Stick margarine. Lard. Shortening. Ghee. Bacon fat. Tropical oils, such as coconut, palm kernel, or palm oils. Sweets and desserts Corn syrup. Sugars. Honey. Molasses. Candy. Jam and jelly. Syrup. Sweetened cereals. Cookies. Pies. Cakes. Donuts. Muffins. Ice cream. Condiments Store-bought sauces, dressings, and marinades that are high in sugar, such as ketchup and barbecue sauce. Summary  High levels of triglycerides can increase the risk of heart disease and stroke. Choosing the right foods can help lower your triglycerides.  Eat plenty of fresh fruits, vegetables, and whole grains. Choose low-fat dairy and lean meats. Eat fatty fish  at least twice a week.  Avoid processed and prepackaged foods with added sugar, sodium, saturated fat, and trans fat.  If you need suggestions or have questions about what types of food are good for you, talk with your health care provider or a dietitian. This information is not intended to replace advice given to you by your health care provider. Make sure you discuss any questions you have with your health care provider. Document Revised: 07/07/2019 Document Reviewed: 07/07/2019 Elsevier Patient Education  2021 [employer].

## 2020-05-30 DIAGNOSIS — F902 Attention-deficit hyperactivity disorder, combined type: Secondary | ICD-10-CM | POA: Insufficient documentation

## 2020-05-30 DIAGNOSIS — N979 Female infertility, unspecified: Secondary | ICD-10-CM | POA: Insufficient documentation

## 2020-06-22 ENCOUNTER — Telehealth: Payer: Self-pay | Admitting: Neurology

## 2020-06-22 NOTE — Telephone Encounter (Signed)
Patient left vm stating she can not find email with the letter Lesly Rubenstein wrote. Asked that we resend to [email or web address]. Given to front desk for them to resend.

## 2020-08-03 ENCOUNTER — Ambulatory Visit (INDEPENDENT_AMBULATORY_CARE_PROVIDER_SITE_OTHER): Payer: 59 | Admitting: Family Medicine

## 2020-08-03 DIAGNOSIS — Z5329 Procedure and treatment not carried out because of patient's decision for other reasons: Secondary | ICD-10-CM

## 2020-08-03 NOTE — Progress Notes (Signed)
Late cancellation

## 2020-10-13 ENCOUNTER — Telehealth (INDEPENDENT_AMBULATORY_CARE_PROVIDER_SITE_OTHER): Payer: 59 | Admitting: Physician Assistant

## 2020-10-13 ENCOUNTER — Encounter: Payer: Self-pay | Admitting: Physician Assistant

## 2020-10-13 VITALS — Temp 101.5°F

## 2020-10-13 DIAGNOSIS — J111 Influenza due to unidentified influenza virus with other respiratory manifestations: Secondary | ICD-10-CM | POA: Diagnosis not present

## 2020-10-13 DIAGNOSIS — R531 Weakness: Secondary | ICD-10-CM

## 2020-10-13 DIAGNOSIS — R0602 Shortness of breath: Secondary | ICD-10-CM | POA: Diagnosis not present

## 2020-10-13 DIAGNOSIS — R059 Cough, unspecified: Secondary | ICD-10-CM

## 2020-10-13 DIAGNOSIS — M79661 Pain in right lower leg: Secondary | ICD-10-CM

## 2020-10-13 DIAGNOSIS — R52 Pain, unspecified: Secondary | ICD-10-CM | POA: Insufficient documentation

## 2020-10-13 DIAGNOSIS — M79662 Pain in left lower leg: Secondary | ICD-10-CM

## 2020-10-13 NOTE — Progress Notes (Signed)
..Virtual Visit via Video Note  I connected with Melissa Sharp on 10/13/20 at 11:10 AM EDT by a video enabled telemedicine application and verified that I am speaking with the correct person using two identifiers.  Location: Patient: car Provider: clinic  .Marland KitchenParticipating in visit:  Patient: Melissa Sharp Provider: Tandy Gaw PA-C   I discussed the limitations of evaluation and management by telemedicine and the availability of in person appointments. The patient expressed understanding and agreed to proceed.  History of Present Illness: Patient is a 40 year old female who calls into the clinic after diagnosis of influenza on Monday who does not feel like she is getting better.  She did finish Tamiflu today.  She has a dry hacking cough.  She is having fever, chills, dizziness, congestion, severe headache, bilateral ear pain, green sinus mucus, shortness of breath.  No other sick contacts in the home.  She is taking over-the-counter medications which are not helping.  Her temperature today was 101.5.  She is on her way home from the beach.  She feels very dehydrated.  Her COVID test in urgent care was negative.  She does admit to extreme weakness and bilateral leg pain and tingling.  She is most concerned about her shortness of breath. She does have asthma that flares when she is sick. She was at the beach and did not have any of her inhalers.    .. Active Ambulatory Problems    Diagnosis Date Noted   IBS (irritable bowel syndrome) 10/27/2013   Migraine without aura, without mention of intractable migraine without mention of status migrainosus 10/29/2013   Esophageal reflux 10/29/2013   Depression, major, recurrent (HCC) 10/29/2013   Mixed anxiety depressive disorder 10/29/2013   B12 deficiency 10/29/2013   Hypertriglyceridemia 10/29/2013   Elevated liver enzymes 11/02/2013   Poor venous access 01/24/2015   Post concussive syndrome 05/21/2015   Cervical pain (neck) 06/26/2015    Intractable migraine 06/30/2015   Migraine with status migrainosus 07/15/2015   Diastasis recti 02/20/2016   Low back pain with radiation, unspecified laterality 02/23/2016   Overweight (BMI 25.0-29.9) 03/20/2016   Elevated blood pressure reading 05/03/2016   Tachycardia with heart rate 100-120 beats per minute 02/25/2017   Tapeworm infection 02/25/2017   Non-seasonal allergic rhinitis 05/08/2017   Anxiety 05/08/2017   Functional diarrhea 07/01/2017   Right upper quadrant guarding 07/01/2017   Bloating 07/01/2017   Nausea 07/01/2017   Cervical radiculopathy 03/15/2018   Muscle weakness of left upper extremity 03/15/2018   Hypertension goal BP (blood pressure) < 130/80 03/15/2018   Influenza-like illness 05/22/2018   Annular skin lesion 06/25/2018   Tinea corporis 11/19/2019   Recurrent isolated sleep paralysis 12/23/2019   Staring episodes 12/23/2019   Chronic fatigue 12/23/2019   Acute abdominal pain 03/22/2020   Attention deficit hyperactivity disorder (ADHD), combined type 05/30/2020   Female fertility problems 05/30/2020   Bilateral calf pain 10/13/2020   Body aches 10/13/2020   SOB (shortness of breath) on exertion 10/13/2020   Weakness 10/13/2020   Cough 10/13/2020   Resolved Ambulatory Problems    Diagnosis Date Noted   Thyroid activity decreased 10/27/2013   Pregnancy 01/24/2015   Viral gastroenteritis 01/24/2015   Nausea and vomiting during pregnancy 03/07/2015   Dizziness 03/07/2015   Dehydration 03/07/2015   Musculoskeletal neck pain 06/26/2015   PIH (pregnancy induced hypertension) 07/28/2015   Chalazion left upper eyelid 08/26/2018   Sinobronchitis 09/24/2019   Past Medical History:  Diagnosis Date   Depression    Hypothyroidism  Migraine    Preeclampsia     Observations/Objective: Pt appears to have flushed cheeks with overall pale appearance. Dry hacking cough.  Fatigued appearance.  No wheezing or labored breathing  Today's Vitals    10/13/20 1104  Temp: (!) 101.5 F (38.6 C)   There is no height or weight on file to calculate BMI.   Assessment and Plan: Marland KitchenMarland KitchenMaryrose was seen today for influenza.  Diagnoses and all orders for this visit:  Influenza -     CBC w/Diff/Platelet -     COMPLETE METABOLIC PANEL WITH GFR -     D-Dimer, Quantitative -     DG Chest 2 View; Future  Cough -     CBC w/Diff/Platelet -     COMPLETE METABOLIC PANEL WITH GFR -     D-Dimer, Quantitative -     DG Chest 2 View; Future  Weakness -     CBC w/Diff/Platelet -     COMPLETE METABOLIC PANEL WITH GFR -     D-Dimer, Quantitative -     DG Chest 2 View; Future  SOB (shortness of breath) on exertion -     CBC w/Diff/Platelet -     COMPLETE METABOLIC PANEL WITH GFR -     D-Dimer, Quantitative -     DG Chest 2 View; Future  Body aches -     CBC w/Diff/Platelet -     COMPLETE METABOLIC PANEL WITH GFR -     D-Dimer, Quantitative -     DG Chest 2 View; Future  Bilateral calf pain -     CBC w/Diff/Platelet -     COMPLETE METABOLIC PANEL WITH GFR -     D-Dimer, Quantitative -     DG Chest 2 View; Future  Pt tested positive for influenza 5 days ago and completed tamiflu.  Negative for covid. Her symptoms are concerning for covid as well.  She is on her way home from the beach.  Asked her to get here by 3:30 to get IV fluids. She was not able to.  Discussed concerned for blood clots with breathing and tingling/achy legs.  Needs labs and CXR. Ordered today.  If she cannot get into clinic today suggest she go to UC/ED for full work up since she is not feeling better.     Follow Up Instructions:    I discussed the assessment and treatment plan with the patient. The patient was provided an opportunity to ask questions and all were answered. The patient agreed with the plan and demonstrated an understanding of the instructions.   The patient was advised to call back or seek an in-person evaluation if the symptoms worsen or if the  condition fails to improve as anticipated.  Spent 30 minutes with a patient discussing symptoms and treatment plan.   Tandy Gaw, PA-C

## 2020-10-24 ENCOUNTER — Ambulatory Visit (INDEPENDENT_AMBULATORY_CARE_PROVIDER_SITE_OTHER): Payer: 59 | Admitting: Physician Assistant

## 2020-10-24 ENCOUNTER — Other Ambulatory Visit: Payer: Self-pay

## 2020-10-24 VITALS — BP 117/80 | HR 90 | Temp 97.5°F | Ht 63.0 in | Wt 133.0 lb

## 2020-10-24 DIAGNOSIS — R002 Palpitations: Secondary | ICD-10-CM

## 2020-10-24 DIAGNOSIS — R519 Headache, unspecified: Secondary | ICD-10-CM

## 2020-10-24 DIAGNOSIS — R0602 Shortness of breath: Secondary | ICD-10-CM | POA: Diagnosis not present

## 2020-10-24 DIAGNOSIS — U071 COVID-19: Secondary | ICD-10-CM | POA: Diagnosis not present

## 2020-10-24 DIAGNOSIS — J1282 Pneumonia due to coronavirus disease 2019: Secondary | ICD-10-CM

## 2020-10-24 DIAGNOSIS — R7989 Other specified abnormal findings of blood chemistry: Secondary | ICD-10-CM | POA: Diagnosis not present

## 2020-10-24 DIAGNOSIS — D649 Anemia, unspecified: Secondary | ICD-10-CM

## 2020-10-24 NOTE — Progress Notes (Signed)
Subjective:    Patient ID: Melissa Sharp, female    DOB: 05/24/1980, 40 y.o.   MRN: [id number]  HPI Patient is a 40 year old female who presents to the clinic with her husband to follow up from hospital after covid 19 pneumonia. She went to ED on 10/13/2020. Elevated D-dimer-CTA negative for PE, BNP very elevated at over 400, HgB 10.2. pt was observed and then released.   She continues to have headaches intermittently. She is weak and fatigued. She has more shortness of breath than she is used too. No wheezing. No calf swelling.    .. Active Ambulatory Problems    Diagnosis Date Noted   IBS (irritable bowel syndrome) 10/27/2013   Migraine without aura, without mention of intractable migraine without mention of status migrainosus 10/29/2013   Esophageal reflux 10/29/2013   Depression, major, recurrent (HCC) 10/29/2013   Mixed anxiety depressive disorder 10/29/2013   B12 deficiency 10/29/2013   Hypertriglyceridemia 10/29/2013   Elevated liver enzymes 11/02/2013   Poor venous access 01/24/2015   Post concussive syndrome 05/21/2015   Cervical pain (neck) 06/26/2015   Intractable migraine 06/30/2015   Migraine with status migrainosus 07/15/2015   Diastasis recti 02/20/2016   Low back pain with radiation, unspecified laterality 02/23/2016   Overweight (BMI 25.0-29.9) 03/20/2016   Elevated blood pressure reading 05/03/2016   Tachycardia with heart rate 100-120 beats per minute 02/25/2017   Tapeworm infection 02/25/2017   Non-seasonal allergic rhinitis 05/08/2017   Anxiety 05/08/2017   Functional diarrhea 07/01/2017   Right upper quadrant guarding 07/01/2017   Bloating 07/01/2017   Nausea 07/01/2017   Cervical radiculopathy 03/15/2018   Muscle weakness of left upper extremity 03/15/2018   Hypertension goal BP (blood pressure) < 130/80 03/15/2018   Influenza-like illness 05/22/2018   Annular skin lesion 06/25/2018   Tinea corporis 11/19/2019   Recurrent isolated sleep paralysis  12/23/2019   Staring episodes 12/23/2019   Chronic fatigue 12/23/2019   Acute abdominal pain 03/22/2020   Attention deficit hyperactivity disorder (ADHD), combined type 05/30/2020   Female fertility problems 05/30/2020   Bilateral calf pain 10/13/2020   Body aches 10/13/2020   SOB (shortness of breath) on exertion 10/13/2020   Weakness 10/13/2020   Cough 10/13/2020   Pneumonia due to COVID-19 virus 10/25/2020   Elevated brain natriuretic peptide (BNP) level 10/25/2020   Low hemoglobin 10/25/2020   SOB (shortness of breath) 10/25/2020   Nonintractable headache 10/25/2020   Palpitations 10/25/2020   Resolved Ambulatory Problems    Diagnosis Date Noted   Thyroid activity decreased 10/27/2013   Pregnancy 01/24/2015   Viral gastroenteritis 01/24/2015   Nausea and vomiting during pregnancy 03/07/2015   Dizziness 03/07/2015   Dehydration 03/07/2015   Musculoskeletal neck pain 06/26/2015   PIH (pregnancy induced hypertension) 07/28/2015   Chalazion left upper eyelid 08/26/2018   Sinobronchitis 09/24/2019   Past Medical History:  Diagnosis Date   Depression    Hypothyroidism    Migraine    Preeclampsia      Review of Systems See HPI.     Objective:   Physical Exam Vitals reviewed.  Constitutional:      Appearance: Normal appearance.  HENT:     Head: Normocephalic.  Cardiovascular:     Rate and Rhythm: Normal rate and regular rhythm.     Pulses: Normal pulses.     Heart sounds: Normal heart sounds.  Pulmonary:     Effort: Pulmonary effort is normal.     Breath sounds: Normal breath sounds.  Musculoskeletal:  Comments: No calf swelling or tenderness.   Neurological:     General: No focal deficit present.     Mental Status: She is alert and oriented to person, place, and time.  Psychiatric:        Mood and Affect: Mood normal.          Assessment & Plan:  Marland KitchenMarland KitchenLeontyne was seen today for hospitalization follow-up.  Diagnoses and all orders for this  visit:  Pneumonia due to COVID-19 virus -     Brain natriuretic peptide -     CBC with Differential/Platelet -     COMPLETE METABOLIC PANEL WITH GFR  Elevated brain natriuretic peptide (BNP) level -     Brain natriuretic peptide  Low hemoglobin -     CBC with Differential/Platelet  SOB (shortness of breath)  Nonintractable headache, unspecified chronicity pattern, unspecified headache type  Palpitations  Pt appears to be doing much better. Vitals look great today. She continues to have very low energy and be short of breath. O2 stats staying up though. Discussed with patient post covid symptoms may last for weeks to months. Should not be getting worse though. Anything concerning follow up. Lungs sound good today. Discussed good deep breathing and good nutrition to heal appropriately. HA ok to take ibuprofen or tylenol. Need to recheck hemoglobin and BNP to make sure headed in right directions. Having some palpitations. Get labs first but if continues needs cardiology follow up or at least zio patch.

## 2020-10-25 ENCOUNTER — Other Ambulatory Visit: Payer: Self-pay | Admitting: Neurology

## 2020-10-25 ENCOUNTER — Encounter: Payer: Self-pay | Admitting: Physician Assistant

## 2020-10-25 DIAGNOSIS — J1282 Pneumonia due to coronavirus disease 2019: Secondary | ICD-10-CM

## 2020-10-25 DIAGNOSIS — U071 COVID-19: Secondary | ICD-10-CM

## 2020-10-25 DIAGNOSIS — R748 Abnormal levels of other serum enzymes: Secondary | ICD-10-CM

## 2020-10-25 DIAGNOSIS — R002 Palpitations: Secondary | ICD-10-CM | POA: Insufficient documentation

## 2020-10-25 DIAGNOSIS — R519 Headache, unspecified: Secondary | ICD-10-CM | POA: Insufficient documentation

## 2020-10-25 DIAGNOSIS — R7989 Other specified abnormal findings of blood chemistry: Secondary | ICD-10-CM | POA: Insufficient documentation

## 2020-10-25 DIAGNOSIS — R0602 Shortness of breath: Secondary | ICD-10-CM | POA: Insufficient documentation

## 2020-10-25 DIAGNOSIS — D649 Anemia, unspecified: Secondary | ICD-10-CM | POA: Insufficient documentation

## 2020-10-25 HISTORY — DX: Pneumonia due to coronavirus disease 2019: J12.82

## 2020-10-25 HISTORY — DX: COVID-19: U07.1

## 2020-10-25 LAB — COMPLETE METABOLIC PANEL WITH GFR
AG Ratio: 1.5 (calc) (ref 1.0–2.5)
ALT: 77 U/L — ABNORMAL HIGH (ref 6–29)
AST: 42 U/L — ABNORMAL HIGH (ref 10–30)
Albumin: 4 g/dL (ref 3.6–5.1)
Alkaline phosphatase (APISO): 153 U/L — ABNORMAL HIGH (ref 31–125)
BUN/Creatinine Ratio: 18 (calc) (ref 6–22)
BUN: 19 mg/dL (ref 7–25)
CO2: 31 mmol/L (ref 20–32)
Calcium: 10 mg/dL (ref 8.6–10.2)
Chloride: 97 mmol/L — ABNORMAL LOW (ref 98–110)
Creat: 1.05 mg/dL — ABNORMAL HIGH (ref 0.50–0.99)
Globulin: 2.6 g/dL (calc) (ref 1.9–3.7)
Glucose, Bld: 76 mg/dL (ref 65–139)
Potassium: 4.8 mmol/L (ref 3.5–5.3)
Sodium: 138 mmol/L (ref 135–146)
Total Bilirubin: 0.4 mg/dL (ref 0.2–1.2)
Total Protein: 6.6 g/dL (ref 6.1–8.1)
eGFR: 69 mL/min/{1.73_m2} (ref >=60)

## 2020-10-25 LAB — CBC WITH DIFFERENTIAL/PLATELET
Absolute Monocytes: 394 cells/uL (ref 200–950)
Basophils Absolute: 80 cells/uL (ref 0–200)
Basophils Relative: 1.1 %
Eosinophils Absolute: 153 cells/uL (ref 15–500)
Eosinophils Relative: 2.1 %
HCT: 37.7 % (ref 35.0–45.0)
Hemoglobin: 12.2 g/dL (ref 11.7–15.5)
Lymphs Abs: 2562 cells/uL (ref 850–3900)
MCH: 29.1 pg (ref 27.0–33.0)
MCHC: 32.4 g/dL (ref 32.0–36.0)
MCV: 90 fL (ref 80.0–100.0)
MPV: 9.1 fL (ref 7.5–12.5)
Monocytes Relative: 5.4 %
Neutro Abs: 4110 cells/uL (ref 1500–7800)
Neutrophils Relative %: 56.3 %
Platelets: 697 10*3/uL — ABNORMAL HIGH (ref 140–400)
RBC: 4.19 10*6/uL (ref 3.80–5.10)
RDW: 13.3 % (ref 11.0–15.0)
Total Lymphocyte: 35.1 %
WBC: 7.3 10*3/uL (ref 3.8–10.8)

## 2020-10-25 LAB — BRAIN NATRIURETIC PEPTIDE: Brain Natriuretic Peptide: 13 pg/mL (ref <100)

## 2020-10-25 NOTE — Progress Notes (Signed)
Kristen,   Labs moving in right direction.  BNP MUCH better.  Hemoglobin 12.2 and much better.  Kidney took a little hit but looking better.  Liver enzymes are up but we see this with covid. Avoid alcohol and tylenol. Recheck in 1 month.

## 2020-11-29 ENCOUNTER — Ambulatory Visit: Payer: 59 | Admitting: Physician Assistant

## 2020-12-04 ENCOUNTER — Telehealth: Payer: Self-pay | Admitting: Neurology

## 2020-12-04 NOTE — Telephone Encounter (Signed)
Patient left vm wanting to know if lab order in for her to have drawn. Called back and left vm letting her know labs ordered.

## 2020-12-18 ENCOUNTER — Ambulatory Visit (INDEPENDENT_AMBULATORY_CARE_PROVIDER_SITE_OTHER): Payer: 59 | Admitting: Family Medicine

## 2020-12-18 ENCOUNTER — Encounter: Payer: Self-pay | Admitting: Family Medicine

## 2020-12-18 VITALS — BP 124/89 | HR 77 | Temp 98.2°F | Wt 139.0 lb

## 2020-12-18 DIAGNOSIS — J329 Chronic sinusitis, unspecified: Secondary | ICD-10-CM

## 2020-12-18 DIAGNOSIS — J40 Bronchitis, not specified as acute or chronic: Secondary | ICD-10-CM

## 2020-12-18 MED ORDER — DOXYCYCLINE HYCLATE 100 MG PO TABS
100.0000 mg | ORAL_TABLET | Freq: Two times a day (BID) | ORAL | 0 refills | Status: AC
Start: 2020-12-18 — End: 2020-12-25

## 2020-12-18 NOTE — Progress Notes (Signed)
Acute Office Visit  Subjective:    Patient ID: Melissa Sharp, female    DOB: 1981/02/22, 40 y.o.   MRN: [id number]  Chief Complaint  Patient presents with   Sinusitis    Sinusitis  Patient is in today for 10 days of cold symptoms.  Patient reports for the past 10+ days she has been having nasal congestion/rhinorrhea (green drainage), non-productive cough, L eye drainage, sore throat. States she had allergy symptoms prior. Reports every fall/spring her allergies get really bad like this and turn into sinus infection or bronchitis. She has been treating with OTC meds and Nettie pot withough much improvement. She denies headaches, fever, ear pain, chest pain, shortness of breath, n/v/d. Negative COVID test at home.    Past Medical History:  Diagnosis Date   Anxiety    Chalazion left upper eyelid 08/26/2018   Depression    Hypothyroidism    Migraine    PIH (pregnancy induced hypertension) 07/28/2015   Preeclampsia    Sinobronchitis 09/24/2019   Thyroid activity decreased 10/27/2013    Past Surgical History:  Procedure Laterality Date   TONSILECTOMY/ADENOIDECTOMY WITH MYRINGOTOMY      Family History  Problem Relation Age of Onset   Alcohol abuse Father    Atrial fibrillation Mother    Hyperlipidemia Other    Migraines Neg Hx     Social History   Socioeconomic History   Marital status: Married    Spouse name: Vonna Kotyk   Number of children: 0   Years of education: Not on file   Highest education level: Not on file  Occupational History   Occupation: Designer, fashion/clothing  Tobacco Use   Smoking status: Never   Smokeless tobacco: Never  Substance and Sexual Activity   Alcohol use: Yes    Comment: 5 drinks a week   Drug use: No   Sexual activity: Yes    Birth control/protection: None  Other Topics Concern   Not on file  Social History Narrative   Lives w/ husband   Social Determinants of Health   Financial Resource Strain: Not on file  Food Insecurity: Not on file   Transportation Needs: Not on file  Physical Activity: Not on file  Stress: Not on file  Social Connections: Not on file  Intimate Partner Violence: Not on file    Outpatient Medications Prior to Visit  Medication Sig Dispense Refill   ADDERALL XR 20 MG 24 hr capsule Take 20 mg by mouth every morning.     ALPRAZolam (XANAX) 0.25 MG tablet Take 0.25 mg by mouth at bedtime as needed.     FLUoxetine (PROZAC) 20 MG capsule Take 60 mg by mouth daily.   1   hydrochlorothiazide (HYDRODIURIL) 25 MG tablet hydrochlorothiazide 25 mg tablet  TK 1 T PO QD     lamoTRIgine (LAMICTAL) 200 MG tablet Take 300 mg by mouth daily.     levothyroxine (SYNTHROID) 50 MCG tablet TAKE ONE TABLET BY MOUTH DAILY BEFORE BREAKFAST 90 tablet 3   naltrexone (DEPADE) 50 MG tablet Take 50 mg by mouth daily.     ondansetron (ZOFRAN-ODT) 8 MG disintegrating tablet Take 1 tablet (8 mg total) by mouth every 8 (eight) hours as needed for nausea. 20 tablet 3   No facility-administered medications prior to visit.    Allergies  Allergen Reactions   Codeine Itching   Labetalol     Numbness and tingling/jerking/aphasia   Latex Hives and Itching   Shellfish Allergy Anaphylaxis   Hydrocodone Itching  Review of Systems All review of systems negative except what is listed in the HPI     Objective:    Physical Exam Vitals reviewed.  Constitutional:      Appearance: Normal appearance.  HENT:     Head: Normocephalic and atraumatic.     Right Ear: Tympanic membrane normal.     Left Ear: Tympanic membrane normal.     Nose: Congestion and rhinorrhea present.     Mouth/Throat:     Mouth: Mucous membranes are moist.     Pharynx: Oropharynx is clear. No oropharyngeal exudate or posterior oropharyngeal erythema.  Eyes:     Extraocular Movements: Extraocular movements intact.     Conjunctiva/sclera: Conjunctivae normal.  Cardiovascular:     Rate and Rhythm: Normal rate and regular rhythm.     Heart sounds: Normal  heart sounds.  Pulmonary:     Effort: Pulmonary effort is normal.     Breath sounds: Normal breath sounds. No wheezing or rhonchi.  Musculoskeletal:     Cervical back: Normal range of motion and neck supple. No tenderness.  Lymphadenopathy:     Cervical: No cervical adenopathy.  Skin:    Findings: No rash.  Neurological:     Mental Status: She is alert and oriented to person, place, and time.  Psychiatric:        Mood and Affect: Mood normal.        Behavior: Behavior normal.        Thought Content: Thought content normal.        Judgment: Judgment normal.    BP 124/89 (BP Location: Left Arm, Patient Position: Sitting, Cuff Size: Normal)   Pulse 77   Temp 98.2 F (36.8 C) (Oral)   Wt 139 lb (63 kg)   SpO2 100%   BMI 24.62 kg/m  Wt Readings from Last 3 Encounters:  12/18/20 139 lb (63 kg)  10/24/20 133 lb (60.3 kg)  05/29/20 135 lb (61.2 kg)    There are no preventive care reminders to display for this patient.  There are no preventive care reminders to display for this patient.   Lab Results  Component Value Date   TSH 1.52 05/10/2020   Lab Results  Component Value Date   WBC 7.3 10/24/2020   HGB 12.2 10/24/2020   HCT 37.7 10/24/2020   MCV 90.0 10/24/2020   PLT 697 (H) 10/24/2020   Lab Results  Component Value Date   NA 138 10/24/2020   K 4.8 10/24/2020   CO2 31 10/24/2020   GLUCOSE 76 10/24/2020   BUN 19 10/24/2020   CREATININE 1.05 (H) 10/24/2020   BILITOT 0.4 10/24/2020   ALKPHOS 78 02/20/2016   AST 42 (H) 10/24/2020   ALT 77 (H) 10/24/2020   PROT 6.6 10/24/2020   ALBUMIN 4.8 02/20/2016   CALCIUM 10.0 10/24/2020   ANIONGAP 10 07/28/2015   EGFR 69 10/24/2020   Lab Results  Component Value Date   CHOL 300 (H) 05/10/2020   Lab Results  Component Value Date   HDL 112 05/10/2020   Lab Results  Component Value Date   LDLCALC 133 (H) 05/10/2020   Lab Results  Component Value Date   TRIG 384 (H) 05/10/2020   Lab Results  Component Value  Date   CHOLHDL 2.7 05/10/2020   No results found for: HGBA1C     Assessment & Plan:   Problem List Items Addressed This Visit   None Visit Diagnoses     Sinobronchitis    -  Primary   Relevant Medications   doxycycline (VIBRA-TABS) 100 MG tablet     Given duration, sending in doxycycline (not pregnant). Recommend she take with food and full glass of water. Probiotic may be helpful as well. Continue rest, hydration, warm compresses, warm liquids with honey/lemon, OTC cough/cold/analgesics. Patient aware of signs/symptoms requiring further/urgent evaluation.    Meds ordered this encounter  Medications   doxycycline (VIBRA-TABS) 100 MG tablet    Sig: Take 1 tablet (100 mg total) by mouth 2 (two) times daily for 7 days.    Dispense:  14 tablet    Refill:  0   Follow-up if symptoms worsen or fail to improve.   Terrilyn Saver, NP

## 2020-12-19 LAB — HEPATIC FUNCTION PANEL
AG Ratio: 1.9 (calc) (ref 1.0–2.5)
ALT: 30 U/L — ABNORMAL HIGH (ref 6–29)
AST: 16 U/L (ref 10–30)
Albumin: 4.5 g/dL (ref 3.6–5.1)
Alkaline phosphatase (APISO): 78 U/L (ref 31–125)
Bilirubin, Direct: 0.1 mg/dL (ref 0.0–0.2)
Globulin: 2.4 g/dL (calc) (ref 1.9–3.7)
Indirect Bilirubin: 0.3 mg/dL (calc) (ref 0.2–1.2)
Total Bilirubin: 0.4 mg/dL (ref 0.2–1.2)
Total Protein: 6.9 g/dL (ref 6.1–8.1)

## 2020-12-19 NOTE — Progress Notes (Signed)
Liver enzymes are much better. APO and AST normal now. ALT almost normal range.

## 2021-01-15 ENCOUNTER — Ambulatory Visit: Payer: 59 | Admitting: Physician Assistant

## 2021-01-16 ENCOUNTER — Telehealth: Payer: 59 | Admitting: Physician Assistant

## 2021-01-19 ENCOUNTER — Ambulatory Visit: Payer: 59 | Admitting: Physician Assistant

## 2021-01-22 ENCOUNTER — Telehealth: Payer: Self-pay | Admitting: Physician Assistant

## 2021-01-22 ENCOUNTER — Ambulatory Visit: Payer: Self-pay | Admitting: Physician Assistant

## 2021-01-22 DIAGNOSIS — Z91199 Patient's noncompliance with other medical treatment and regimen due to unspecified reason: Secondary | ICD-10-CM

## 2021-01-22 NOTE — Telephone Encounter (Signed)
Thank you :)

## 2021-01-22 NOTE — Progress Notes (Signed)
No show

## 2021-01-22 NOTE — Telephone Encounter (Signed)
Ok to take off 

## 2021-01-22 NOTE — Telephone Encounter (Signed)
Pt lvm at 8:21 to reschedule her appt. Her appt was scheduled for 9:30.

## 2021-01-25 ENCOUNTER — Ambulatory Visit (INDEPENDENT_AMBULATORY_CARE_PROVIDER_SITE_OTHER): Payer: 59 | Admitting: Physician Assistant

## 2021-01-25 ENCOUNTER — Other Ambulatory Visit: Payer: Self-pay

## 2021-01-25 ENCOUNTER — Encounter: Payer: Self-pay | Admitting: Physician Assistant

## 2021-01-25 VITALS — BP 118/62 | HR 105 | Temp 98.5°F | Ht 63.0 in | Wt 140.0 lb

## 2021-01-25 DIAGNOSIS — I10 Essential (primary) hypertension: Secondary | ICD-10-CM | POA: Insufficient documentation

## 2021-01-25 DIAGNOSIS — M545 Low back pain, unspecified: Secondary | ICD-10-CM | POA: Diagnosis not present

## 2021-01-25 DIAGNOSIS — G8929 Other chronic pain: Secondary | ICD-10-CM | POA: Insufficient documentation

## 2021-01-25 DIAGNOSIS — J0141 Acute recurrent pansinusitis: Secondary | ICD-10-CM | POA: Diagnosis not present

## 2021-01-25 MED ORDER — HYDROCHLOROTHIAZIDE 25 MG PO TABS: 25.0000 mg | ORAL_TABLET | Freq: Every day | ORAL | 1 refills | Status: DC

## 2021-01-25 MED ORDER — AMOXICILLIN-POT CLAVULANATE 875-125 MG PO TABS: 1.0000 | ORAL_TABLET | Freq: Two times a day (BID) | ORAL | 0 refills | Status: DC

## 2021-01-25 MED ORDER — MELOXICAM 15 MG PO TABS: 15.0000 mg | ORAL_TABLET | Freq: Every day | ORAL | 1 refills | Status: DC

## 2021-01-25 MED ORDER — METHYLPREDNISOLONE 4 MG PO TBPK: ORAL_TABLET | ORAL | 0 refills | Status: DC

## 2021-01-25 MED ORDER — FLUTICASONE PROPIONATE 50 MCG/ACT NA SUSP: 2.0000 | Freq: Every day | NASAL | 2 refills | Status: DC
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
## 2021-01-25 NOTE — Patient Instructions (Addendum)
Medrol dose and augment first  Then mobic after you finish medrol dose Flonase daily.   Sinusitis, Adult Sinusitis is soreness and swelling (inflammation) of your sinuses. Sinuses are hollow spaces in the bones around your face. They are located: Around your eyes. In the middle of your forehead. Behind your nose. In your cheekbones. Your sinuses and nasal passages are lined with a fluid called mucus. Mucus drains out of your sinuses. Swelling can trap mucus in your sinuses. This lets germs (bacteria, virus, or fungus) grow, which leads to infection. Most of the time, this condition is caused by a virus. What are the causes? This condition is caused by: Allergies. Asthma. Germs. Things that block your nose or sinuses. Growths in the nose (nasal polyps). Chemicals or irritants in the air. Fungus (rare). What increases the risk? You are more likely to develop this condition if: You have a weak body defense system (immune system). You do a lot of swimming or diving. You use nasal sprays too much. You smoke. What are the signs or symptoms? The main symptoms of this condition are pain and a feeling of pressure around the sinuses. Other symptoms include: Stuffy nose (congestion). Runny nose (drainage). Swelling and warmth in the sinuses. Headache. Toothache. A cough that may get worse at night. Mucus that collects in the throat or the back of the nose (postnasal drip). Being unable to smell and taste. Being very tired (fatigue). A fever. Sore throat. Bad breath. How is this diagnosed? This condition is diagnosed based on: Your symptoms. Your medical history. A physical exam. Tests to find out if your condition is short-term (acute) or long-term (chronic). Your doctor may: Check your nose for growths (polyps). Check your sinuses using a tool that has a light (endoscope). Check for allergies or germs. Do imaging tests, such as an MRI or CT scan. How is this  treated? Treatment for this condition depends on the cause and whether it is short-term or long-term. If caused by a virus, your symptoms should go away on their own within 10 days. You may be given medicines to relieve symptoms. They include: Medicines that shrink swollen tissue in the nose. Medicines that treat allergies (antihistamines). A spray that treats swelling of the nostrils.  Rinses that help get rid of thick mucus in your nose (nasal saline washes). If caused by bacteria, your doctor may wait to see if you will get better without treatment. You may be given antibiotic medicine if you have: A very bad infection. A weak body defense system. If caused by growths in the nose, you may need to have surgery. Follow these instructions at home: Medicines Take, use, or apply over-the-counter and prescription medicines only as told by your doctor. These may include nasal sprays. If you were prescribed an antibiotic medicine, take it as told by your doctor. Do not stop taking the antibiotic even if you start to feel better. Hydrate and humidify  Drink enough water to keep your pee (urine) pale yellow. Use a cool mist humidifier to keep the humidity level in your home above 50%. Breathe in steam for 10-15 minutes, 3-4 times a day, or as told by your doctor. You can do this in the bathroom while a hot shower is running. Try not to spend time in cool or dry air. Rest Rest as much as you can. Sleep with your head raised (elevated). Make sure you get enough sleep each night. General instructions  Put a warm, moist washcloth on your face  3-4 times a day, or as often as told by your doctor. This will help with discomfort. Wash your hands often with soap and water. If there is no soap and water, use hand sanitizer. Do not smoke. Avoid being around people who are smoking (secondhand smoke). Keep all follow-up visits as told by your doctor. This is important. Contact a doctor if: You have a  fever. Your symptoms get worse. Your symptoms do not get better within 10 days. Get help right away if: You have a very bad headache. You cannot stop throwing up (vomiting). You have very bad pain or swelling around your face or eyes. You have trouble seeing. You feel confused. Your neck is stiff. You have trouble breathing. Summary Sinusitis is swelling of your sinuses. Sinuses are hollow spaces in the bones around your face. This condition is caused by tissues in your nose that become inflamed or swollen. This traps germs. These can lead to infection. If you were prescribed an antibiotic medicine, take it as told by your doctor. Do not stop taking it even if you start to feel better. Keep all follow-up visits as told by your doctor. This is important. This information is not intended to replace advice given to you by your health care provider. Make sure you discuss any questions you have with your health care provider. Document Revised: 08/04/2017 Document Reviewed: 08/04/2017 Elsevier Patient Education  2022 [employer].

## 2021-01-29 ENCOUNTER — Telehealth: Payer: Self-pay | Admitting: Physician Assistant

## 2021-01-29 ENCOUNTER — Ambulatory Visit: Payer: 59 | Admitting: Physician Assistant

## 2021-01-29 NOTE — Telephone Encounter (Signed)
Patient called at 8:05am to cancel appt, stated she wasn't feeling great and rescheduled for 02/02/2021. Didn't know rather to take patient off of schedule or leave on? Please Advise. AM

## 2021-02-02 ENCOUNTER — Ambulatory Visit: Payer: 59 | Admitting: Physician Assistant

## 2021-02-02 ENCOUNTER — Encounter: Payer: Self-pay | Admitting: Family Medicine

## 2021-02-02 ENCOUNTER — Ambulatory Visit (INDEPENDENT_AMBULATORY_CARE_PROVIDER_SITE_OTHER): Payer: 59 | Admitting: Family Medicine

## 2021-02-02 VITALS — BP 116/61 | HR 81

## 2021-02-02 DIAGNOSIS — M542 Cervicalgia: Secondary | ICD-10-CM | POA: Diagnosis not present

## 2021-02-02 DIAGNOSIS — Z23 Encounter for immunization: Secondary | ICD-10-CM

## 2021-02-02 DIAGNOSIS — S46819A Strain of other muscles, fascia and tendons at shoulder and upper arm level, unspecified arm, initial encounter: Secondary | ICD-10-CM

## 2021-02-02 DIAGNOSIS — R253 Fasciculation: Secondary | ICD-10-CM

## 2021-02-02 MED ORDER — CYCLOBENZAPRINE HCL 10 MG PO TABS: 10.0000 mg | ORAL_TABLET | Freq: Two times a day (BID) | ORAL | 0 refills | Status: DC | PRN
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
## 2021-03-02 ENCOUNTER — Telehealth: Payer: 59 | Admitting: Medical-Surgical

## 2021-03-05 ENCOUNTER — Telehealth: Payer: 59 | Admitting: Physician Assistant

## 2021-03-06 ENCOUNTER — Encounter: Payer: Self-pay | Admitting: Physician Assistant

## 2021-03-06 ENCOUNTER — Telehealth (INDEPENDENT_AMBULATORY_CARE_PROVIDER_SITE_OTHER): Payer: 59 | Admitting: Physician Assistant

## 2021-03-06 VITALS — Ht 63.0 in | Wt 140.0 lb

## 2021-03-06 DIAGNOSIS — J0141 Acute recurrent pansinusitis: Secondary | ICD-10-CM

## 2021-03-06 DIAGNOSIS — H109 Unspecified conjunctivitis: Secondary | ICD-10-CM

## 2021-03-06 DIAGNOSIS — Z3A01 Less than 8 weeks gestation of pregnancy: Secondary | ICD-10-CM | POA: Diagnosis not present

## 2021-03-06 DIAGNOSIS — B9689 Other specified bacterial agents as the cause of diseases classified elsewhere: Secondary | ICD-10-CM | POA: Diagnosis not present

## 2021-03-06 MED ORDER — AMOXICILLIN 875 MG PO TABS: 875.0000 mg | ORAL_TABLET | Freq: Two times a day (BID) | ORAL | 0 refills | Status: DC

## 2021-03-06 MED ORDER — POLYMYXIN B-TRIMETHOPRIM 10000-0.1 UNIT/ML-% OP SOLN: 1.0000 [drp] | OPHTHALMIC | 0 refills | Status: DC

## 2021-03-06 NOTE — Progress Notes (Signed)
Subjective:    Melissa KitchenMarland KitchenVirtual Visit via Video Note  I connected with [name] on 03/06/21 at 11:30 AM EST by a video enabled telemedicine application and verified that I am speaking with the correct person using two identifiers.  Location: Patient: home Provider: clinic  .Melissa KitchenParticipating in visit:  Patient: [employer] Provider: Tandy Gaw PA-C    I discussed the limitations of evaluation and management by telemedicine and the availability of in person appointments. The patient expressed understanding and agreed to proceed.  History of Present Illness: Pt is a 40 yo female with seasonal allergies, allergic rhinitis, and recurrent sinus infections who presents to the clinic with 1 week of sinus pressure, sinus drainage, productive cough, green mucus, headaches, chest congestion. She is not taking any OtC medications because she just found out she is pregnant. She has period 4 weeks ago. Daughter is also sick. Not tested for covid. Covid vaccine x2.    .. Active Ambulatory Problems    Diagnosis Date Noted   IBS (irritable bowel syndrome) 10/27/2013   Migraine without aura, without mention of intractable migraine without mention of status migrainosus 10/29/2013   Esophageal reflux 10/29/2013   Depression, major, recurrent (HCC) 10/29/2013   Mixed anxiety depressive disorder 10/29/2013   B12 deficiency 10/29/2013   Hypertriglyceridemia 10/29/2013   Elevated liver enzymes 11/02/2013   Poor venous access 01/24/2015   Post concussive syndrome 05/21/2015   Cervical pain (neck) 06/26/2015   Intractable migraine 06/30/2015   Migraine with status migrainosus 07/15/2015   Diastasis recti 02/20/2016   Low back pain with radiation, unspecified laterality 02/23/2016   Overweight (BMI 25.0-29.9) 03/20/2016   Elevated blood pressure reading 05/03/2016   Tachycardia with heart rate 100-120 beats per minute 02/25/2017   Tapeworm infection 02/25/2017   Non-seasonal allergic rhinitis  05/08/2017   Anxiety 05/08/2017   Functional diarrhea 07/01/2017   Right upper quadrant guarding 07/01/2017   Bloating 07/01/2017   Nausea 07/01/2017   Cervical radiculopathy 03/15/2018   Muscle weakness of left upper extremity 03/15/2018   Hypertension goal BP (blood pressure) < 130/80 03/15/2018   Influenza-like illness 05/22/2018   Annular skin lesion 06/25/2018   Tinea corporis 11/19/2019   Recurrent isolated sleep paralysis 12/23/2019   Staring episodes 12/23/2019   Chronic fatigue 12/23/2019   Acute abdominal pain 03/22/2020   Attention deficit hyperactivity disorder (ADHD), combined type 05/30/2020   Female fertility problems 05/30/2020   Bilateral calf pain 10/13/2020   Body aches 10/13/2020   SOB (shortness of breath) on exertion 10/13/2020   Weakness 10/13/2020   Cough 10/13/2020   Pneumonia due to COVID-19 virus 10/25/2020   Elevated brain natriuretic peptide (BNP) level 10/25/2020   Low hemoglobin 10/25/2020   SOB (shortness of breath) 10/25/2020   Nonintractable headache 10/25/2020   Palpitations 10/25/2020   Acute recurrent pansinusitis 01/25/2021   Primary hypertension 01/25/2021   Chronic bilateral low back pain without sciatica 01/25/2021   Resolved Ambulatory Problems    Diagnosis Date Noted   Thyroid activity decreased 10/27/2013   Pregnancy 01/24/2015   Viral gastroenteritis 01/24/2015   Nausea and vomiting during pregnancy 03/07/2015   Dizziness 03/07/2015   Dehydration 03/07/2015   Musculoskeletal neck pain 06/26/2015   PIH (pregnancy induced hypertension) 07/28/2015   Chalazion left upper eyelid 08/26/2018   Sinobronchitis 09/24/2019   Past Medical History:  Diagnosis Date   Depression    Hypothyroidism    Migraine    Preeclampsia       Observations/Objective: No acute distress  Productive  cough Normal mood and appearance  .Melissa Sharp Today's Vitals   03/06/21 1059  Weight: 140 lb (63.5 kg)  Height: 5\' 3"  (1.6 m)   Body mass index is  24.8 kg/m.   Assessment and Plan: Melissa KitchenShatiqua was seen today for cough.  Diagnoses and all orders for this visit:  Acute recurrent pansinusitis -     amoxicillin (AMOXIL) 875 MG tablet; Take 1 tablet (875 mg total) by mouth 2 (two) times daily.  Less than [redacted] weeks gestation of pregnancy  Bacterial conjunctivitis of both eyes -     trimethoprim-polymyxin b (POLYTRIM) ophthalmic solution; Place 1 drop into both eyes every 4 (four) hours. For 7 days.   Treated with amoxil and polytrim.  Discussed pregnancy safe OTC medications.  Make sure to start prenatal vitamin Stop naltrexone.  Get into OB by 8 weeks.    Follow Up Instructions:    I discussed the assessment and treatment plan with the patient. The patient was provided an opportunity to ask questions and all were answered. The patient agreed with the plan and demonstrated an understanding of the instructions.   The patient was advised to call back or seek an in-person evaluation if the symptoms worsen or if the condition fails to improve as anticipated.     [name], PA-C

## 2021-03-06 NOTE — Progress Notes (Signed)
Started last Thursday Green mucous  Coughing Headaches Chest pressure  No OTC meds Just found out she is pregnant (has not seen OBGYN, unknown how many weeks)

## 2021-04-19 ENCOUNTER — Other Ambulatory Visit: Payer: Self-pay | Admitting: Family Medicine

## 2021-04-30 LAB — HM PAP SMEAR

## 2021-07-04 ENCOUNTER — Other Ambulatory Visit: Payer: Self-pay | Admitting: Physician Assistant

## 2021-07-04 DIAGNOSIS — E039 Hypothyroidism, unspecified: Secondary | ICD-10-CM

## 2021-08-07 ENCOUNTER — Ambulatory Visit: Payer: Self-pay | Admitting: Medical-Surgical

## 2021-08-08 ENCOUNTER — Ambulatory Visit: Payer: Self-pay | Admitting: Sports Medicine

## 2021-08-09 ENCOUNTER — Telehealth: Payer: Self-pay | Admitting: Physician Assistant

## 2021-08-09 DIAGNOSIS — G43011 Migraine without aura, intractable, with status migrainosus: Secondary | ICD-10-CM

## 2021-08-09 DIAGNOSIS — I10 Essential (primary) hypertension: Secondary | ICD-10-CM

## 2021-08-09 NOTE — Telephone Encounter (Signed)
Patient called to request a referral to Dr. Lucia Gaskins of [facility].

## 2021-08-09 NOTE — Telephone Encounter (Signed)
Its for migraines.   She also requested a refill for hydroodiuril her bp meds. She has an appt scheduled on 6/20.

## 2021-08-09 NOTE — Telephone Encounter (Signed)
Did she say what for? Migraines?

## 2021-08-10 MED ORDER — HYDROCHLOROTHIAZIDE 25 MG PO TABS: 25.0000 mg | ORAL_TABLET | Freq: Every day | ORAL | 0 refills | Status: DC

## 2021-08-10 NOTE — Telephone Encounter (Signed)
Orders Placed This Encounter  Procedures   Ambulatory referral to Neurology    Referral Priority:   Routine    Referral Type:   Consultation    Referral Reason:   Specialty Services Required    Requested Specialty:   Neurology    Number of Visits Requested:   1    

## 2021-08-10 NOTE — Telephone Encounter (Signed)
[facility]Z sent for 90 day supply with note that patient needs appt for further refills.   Referral pended for Neurology for migraines. Please sign if appropriate.

## 2021-08-15 ENCOUNTER — Other Ambulatory Visit: Payer: Self-pay

## 2021-08-15 ENCOUNTER — Ambulatory Visit (INDEPENDENT_AMBULATORY_CARE_PROVIDER_SITE_OTHER): Payer: Medicaid Other

## 2021-08-15 ENCOUNTER — Encounter: Payer: Self-pay | Admitting: Sports Medicine

## 2021-08-15 ENCOUNTER — Ambulatory Visit: Payer: Medicaid Other | Admitting: Sports Medicine

## 2021-08-15 DIAGNOSIS — J40 Bronchitis, not specified as acute or chronic: Secondary | ICD-10-CM

## 2021-08-15 DIAGNOSIS — I10 Essential (primary) hypertension: Secondary | ICD-10-CM

## 2021-08-15 DIAGNOSIS — M79671 Pain in right foot: Secondary | ICD-10-CM | POA: Insufficient documentation

## 2021-08-15 MED ORDER — HYDROCHLOROTHIAZIDE 25 MG PO TABS: 25.0000 mg | ORAL_TABLET | Freq: Every day | ORAL | 0 refills | Status: DC

## 2021-08-15 MED ORDER — AZITHROMYCIN 250 MG PO TABS: ORAL_TABLET | ORAL | 0 refills | Status: DC

## 2021-08-15 MED ORDER — IPRATROPIUM BROMIDE 0.06 % NA SOLN: 2.0000 | Freq: Four times a day (QID) | NASAL | 11 refills | Status: DC

## 2021-08-15 NOTE — Progress Notes (Signed)
    Procedures performed today:    None.  Independent interpretation of notes and tests performed by another provider:   None.  Brief History, Exam, Impression, and Recommendations:    Bronchitis Pleasant 41 year old female, 6 months pregnant, 1 week of increasing purulent nasal discharge, minimally productive cough, very mild aches. Fatigue. No sinus pain or pressure. Lungs are clear on exam, oropharyngeal exam is unremarkable. She will do a home COVID test. I am also going to add azithromycin and nasal Atrovent.  Right foot pain Right foot pain after a fall a couple weeks ago. Minimal swelling, pain over the dorsum of the second through fourth metatarsal shafts. We will get some x-rays, I do think this is probably more of a stress mediated process, avoid barefoot walking, she does have a postop shoe at home that she will wear for a couple weeks.    ___________________________________________ Gwen Her. Dianah Field, M.D., ABFM., CAQSM. Primary Care and [place] Instructor of [place] of [facility] of Medicine

## 2021-08-15 NOTE — Assessment & Plan Note (Signed)
Pleasant 41 year old female, 6 months pregnant, 1 week of increasing purulent nasal discharge, minimally productive cough, very mild aches. Fatigue. No sinus pain or pressure. Lungs are clear on exam, oropharyngeal exam is unremarkable. She will do a home COVID test. I am also going to add azithromycin and nasal Atrovent.

## 2021-08-15 NOTE — Assessment & Plan Note (Signed)
Right foot pain after a fall a couple weeks ago. Minimal swelling, pain over the dorsum of the second through fourth metatarsal shafts. We will get some x-rays, I do think this is probably more of a stress mediated process, avoid barefoot walking, she does have a postop shoe at home that she will wear for a couple weeks.

## 2021-09-04 ENCOUNTER — Encounter: Payer: Medicaid Other | Admitting: Physician Assistant

## 2021-09-04 DIAGNOSIS — Z1329 Encounter for screening for other suspected endocrine disorder: Secondary | ICD-10-CM

## 2021-09-04 DIAGNOSIS — Z0000 Encounter for general adult medical examination without abnormal findings: Secondary | ICD-10-CM

## 2021-09-04 DIAGNOSIS — D649 Anemia, unspecified: Secondary | ICD-10-CM

## 2021-09-04 DIAGNOSIS — I10 Essential (primary) hypertension: Secondary | ICD-10-CM

## 2021-09-12 NOTE — Progress Notes (Signed)
Erroneous encounter. Please disregard.

## 2021-09-13 ENCOUNTER — Encounter: Payer: Medicaid Other | Admitting: Medical-Surgical

## 2021-09-14 ENCOUNTER — Encounter: Payer: Self-pay | Admitting: Sports Medicine

## 2021-09-14 ENCOUNTER — Telehealth (INDEPENDENT_AMBULATORY_CARE_PROVIDER_SITE_OTHER): Payer: Medicaid Other | Admitting: Sports Medicine

## 2021-09-14 DIAGNOSIS — J40 Bronchitis, not specified as acute or chronic: Secondary | ICD-10-CM | POA: Diagnosis not present

## 2021-09-14 MED ORDER — MONTELUKAST SODIUM 10 MG PO TABS: 10.0000 mg | ORAL_TABLET | Freq: Every day | ORAL | 3 refills | Status: DC

## 2021-09-14 MED ORDER — ALBUTEROL SULFATE HFA 108 (90 BASE) MCG/ACT IN AERS: 2.0000 | INHALATION_SPRAY | Freq: Four times a day (QID) | RESPIRATORY_TRACT | 11 refills | Status: DC | PRN

## 2021-09-14 MED ORDER — AMOXICILLIN-POT CLAVULANATE 875-125 MG PO TABS: 1.0000 | ORAL_TABLET | Freq: Two times a day (BID) | ORAL | 0 refills | Status: DC

## 2021-09-14 MED ORDER — PREDNISONE 50 MG PO TABS: 50.0000 mg | ORAL_TABLET | Freq: Every day | ORAL | 0 refills | Status: DC

## 2021-09-14 NOTE — Progress Notes (Signed)
   Virtual Visit via Telephone   I connected with  Melissa Sharp  on 09/14/21 by telephone/telehealth and verified that I am speaking with the correct person using two identifiers.   I discussed the limitations, risks, security and privacy concerns of performing an evaluation and management service by telephone, including the higher likelihood of inaccurate diagnosis and treatment, and the availability of in person appointments.  We also discussed the likely need of an additional face to face encounter for complete and high quality delivery of care.  I also discussed with the patient that there may be a patient responsible charge related to this service. The patient expressed understanding and wishes to proceed.  Provider location is in medical facility. Patient location is at their home, different from provider location. People involved in care of the patient during this telehealth encounter were myself, my nurse/medical assistant, and my front office/scheduling team member.  Review of Systems: No fevers, chills, night sweats, weight loss, chest pain, or shortness of breath.   Objective Findings:    General: Speaking full sentences, no audible heavy breathing.  Sounds alert and appropriately interactive.    Independent interpretation of tests performed by another provider:   None.  Brief History, Exam, Impression, and Recommendations:    Bronchitis This is a 41 year old pregnant female, I treated her about a month ago for nasal discharge, cough, muscle aches and body aches and fatigue. She had a negative home COVID test at that time, we treated her with azithromycin and nasal Atrovent. She calls back, tells me she has not gotten better for the entire month. Persistent rhinorrhea, facial pain and pressure, productive cough. No fevers, chills, no muscle aches, body aches, negative COVID again. Lots of social issues at home as well. On further questioning she has had intermittent  shortness of breath since childhood, never had a pulmonary function test. We will treat her aggressively for this episode, 5 days of a steroid burst, albuterol, Singulair, Augmentin. She will come here today for chest x-ray. After a month when she improves I would like to do a lung function test to evaluate for chronic obstructive or restrictive disease. Of note she does have history of COVID-pneumonia is likely has some chronic parenchymal scarring.   I discussed the above assessment and treatment plan with the patient. The patient was provided an opportunity to ask questions and all were answered. The patient agreed with the plan and demonstrated an understanding of the instructions.   The patient was advised to call back or seek an in-person evaluation if the symptoms worsen or if the condition fails to improve as anticipated.   I provided 30 minutes of verbal and non-verbal time during this encounter date, time was needed to gather information, review chart, records, communicate/coordinate with staff remotely, as well as complete documentation.   ____________________________________________ Ihor Austin. Benjamin Stain, M.D., ABFM., CAQSM., AME. Primary Care and Sports Medicine [facility]  Adjunct Professor of Family Medicine  [place] [employer] [facility] [employer]  Restaurant manager, fast food

## 2021-09-14 NOTE — Assessment & Plan Note (Signed)
This is a 41 year old pregnant female, I treated her about a month ago for nasal discharge, cough, muscle aches and body aches and fatigue. She had a negative home COVID test at that time, we treated her with azithromycin and nasal Atrovent. She calls back, tells me she has not gotten better for the entire month. Persistent rhinorrhea, facial pain and pressure, productive cough. No fevers, chills, no muscle aches, body aches, negative COVID again. Lots of social issues at home as well. On further questioning she has had intermittent shortness of breath since childhood, never had a pulmonary function test. We will treat her aggressively for this episode, 5 days of a steroid burst, albuterol, Singulair, Augmentin. She will come here today for chest x-ray. After a month when she improves I would like to do a lung function test to evaluate for chronic obstructive or restrictive disease. Of note she does have history of COVID-pneumonia is likely has some chronic parenchymal scarring.

## 2021-09-27 ENCOUNTER — Telehealth: Payer: Self-pay

## 2021-09-27 NOTE — Telephone Encounter (Signed)
Patient had a video appointment with Dr. Karie Schwalbe on 6/30 for bronchitis- like symptoms. Per patient, she was told she needs a chest x-ray and a pulmonary function test ordered. Patient mentioned in her call that she is feeling much better since her appointment. If she needs to have an appointment with provider, she prefers early morning scheduling. Please advise, thanks.

## 2021-10-01 NOTE — Telephone Encounter (Signed)
Per patient - she has been stable and feeling good for weeks. Request that provider orders the PFT for her as soon as possible. She would like to have the study done before she gives birth in about 4 weeks. She wants provider to know that she is no longer having SOB/cough or any respiratory issues. Please advise, thanks.

## 2021-10-03 NOTE — Telephone Encounter (Signed)
I called pt and left her a vm to give me a call back so I could double book her on Friday for her spirometry - Per Tandy Gaw

## 2021-10-05 ENCOUNTER — Ambulatory Visit (INDEPENDENT_AMBULATORY_CARE_PROVIDER_SITE_OTHER): Payer: Medicaid Other | Admitting: Physician Assistant

## 2021-10-05 ENCOUNTER — Ambulatory Visit: Payer: Medicaid Other | Admitting: Physician Assistant

## 2021-10-05 VITALS — BP 124/82 | HR 68 | Wt 169.0 lb

## 2021-10-05 DIAGNOSIS — I10 Essential (primary) hypertension: Secondary | ICD-10-CM

## 2021-10-05 DIAGNOSIS — R0602 Shortness of breath: Secondary | ICD-10-CM | POA: Diagnosis not present

## 2021-10-05 DIAGNOSIS — M5442 Lumbago with sciatica, left side: Secondary | ICD-10-CM | POA: Diagnosis not present

## 2021-10-05 DIAGNOSIS — G8929 Other chronic pain: Secondary | ICD-10-CM | POA: Diagnosis not present

## 2021-10-05 DIAGNOSIS — E039 Hypothyroidism, unspecified: Secondary | ICD-10-CM

## 2021-10-05 DIAGNOSIS — F418 Other specified anxiety disorders: Secondary | ICD-10-CM

## 2021-10-05 MED ORDER — FLUOXETINE HCL 20 MG PO CAPS: 60.0000 mg | ORAL_CAPSULE | Freq: Every day | ORAL | 1 refills | Status: DC

## 2021-10-05 MED ORDER — METHYLPREDNISOLONE SODIUM SUCC 125 MG IJ SOLR
125.0000 mg | Freq: Once | INTRAMUSCULAR | Status: AC
  Administered 2021-10-05: 125 mg via INTRAMUSCULAR

## 2021-10-05 MED ORDER — HYDROCHLOROTHIAZIDE 25 MG PO TABS: 25.0000 mg | ORAL_TABLET | Freq: Every day | ORAL | 1 refills | Status: DC

## 2021-10-05 MED ORDER — LEVOTHYROXINE SODIUM 50 MCG PO TABS
ORAL_TABLET | ORAL | 3 refills | Status: DC
Start: 2021-10-05 — End: 2022-08-07

## 2021-10-05 MED ORDER — ALBUTEROL SULFATE HFA 108 (90 BASE) MCG/ACT IN AERS
4.0000 | INHALATION_SPRAY | Freq: Once | RESPIRATORY_TRACT | Status: AC
  Administered 2021-10-05: 4 via RESPIRATORY_TRACT

## 2021-10-05 MED ORDER — LAMOTRIGINE 150 MG PO TABS: 150.0000 mg | ORAL_TABLET | Freq: Every day | ORAL | 1 refills | Status: DC

## 2021-10-05 NOTE — Progress Notes (Unsigned)
Established Patient Office Visit  Subjective   Patient ID: Melissa Sharp, female    DOB: [birth year]  Age: 41 y.o. MRN: [id number]  Chief Complaint  Patient presents with   Shortness of Breath    HPI Pt is a 41 yo female who is [redacted] weeks pregnant and here to follow up on recurrent bronchitis. She is having more recurrent bronchitis and last provider requested spirometry. She was started on singulair and feeling better. She denies any SOB or cough. She is not using her albuterol inhaler at all.   She continues to have some low back pain more left than right and runs down the back on left leg from time to time. No bowel or bladder dysfunction   She does report her father was dx with pancreatic cancer.   .. Active Ambulatory Problems    Diagnosis Date Noted   IBS (irritable bowel syndrome) 10/27/2013   Migraine without aura, without mention of intractable migraine without mention of status migrainosus 10/29/2013   Esophageal reflux 10/29/2013   Depression, major, recurrent (HCC) 10/29/2013   Mixed anxiety depressive disorder 10/29/2013   B12 deficiency 10/29/2013   Hypertriglyceridemia 10/29/2013   Elevated liver enzymes 11/02/2013   Poor venous access 01/24/2015   Post concussive syndrome 05/21/2015   Cervical pain (neck) 06/26/2015   Intractable migraine 06/30/2015   Migraine with status migrainosus 07/15/2015   Diastasis recti 02/20/2016   Low back pain with radiation, unspecified laterality 02/23/2016   Overweight (BMI 25.0-29.9) 03/20/2016   Elevated blood pressure reading 05/03/2016   Tachycardia with heart rate 100-120 beats per minute 02/25/2017   Tapeworm infection 02/25/2017   Non-seasonal allergic rhinitis 05/08/2017   Anxiety 05/08/2017   Functional diarrhea 07/01/2017   Right upper quadrant guarding 07/01/2017   Bloating 07/01/2017   Nausea 07/01/2017   Cervical radiculopathy 03/15/2018   Muscle weakness of left upper extremity 03/15/2018   Hypertension goal  BP (blood pressure) < 130/80 03/15/2018   Influenza-like illness 05/22/2018   Annular skin lesion 06/25/2018   Tinea corporis 11/19/2019   Recurrent isolated sleep paralysis 12/23/2019   Staring episodes 12/23/2019   Chronic fatigue 12/23/2019   Acute abdominal pain 03/22/2020   Attention deficit hyperactivity disorder (ADHD), combined type 05/30/2020   Female fertility problems 05/30/2020   Bilateral calf pain 10/13/2020   Body aches 10/13/2020   Weakness 10/13/2020   Pneumonia due to COVID-19 virus 10/25/2020   Elevated brain natriuretic peptide (BNP) level 10/25/2020   Low hemoglobin 10/25/2020   Nonintractable headache 10/25/2020   Palpitations 10/25/2020   Acute recurrent pansinusitis 01/25/2021   Primary hypertension 01/25/2021   Chronic bilateral low back pain without sciatica 01/25/2021   Bronchitis 08/15/2021   Right foot pain 08/15/2021   Chronic left-sided low back pain with left-sided sciatica 10/05/2021   Resolved Ambulatory Problems    Diagnosis Date Noted   Thyroid activity decreased 10/27/2013   Pregnancy 01/24/2015   Viral gastroenteritis 01/24/2015   Nausea and vomiting during pregnancy 03/07/2015   Dizziness 03/07/2015   Dehydration 03/07/2015   Musculoskeletal neck pain 06/26/2015   PIH (pregnancy induced hypertension) 07/28/2015   Chalazion left upper eyelid 08/26/2018   Sinobronchitis 09/24/2019   SOB (shortness of breath) on exertion 10/13/2020   Cough 10/13/2020   SOB (shortness of breath) 10/25/2020   Past Medical History:  Diagnosis Date   Depression    Hypothyroidism    Migraine    Preeclampsia      ROS    Objective:  BP 124/82   Pulse 68   Wt 169 lb (76.7 kg)   LMP 12/29/2020   SpO2 100%   BMI 29.94 kg/m  {Vitals History (Optional):23777}  Physical Exam   No results found for any visits on 10/05/21.  {Labs (Optional):23779}  The ASCVD Risk score (Arnett DK, et al., 2019) failed to calculate for the following  reasons:   The valid HDL cholesterol range is 20 to 100 mg/dL    Assessment & Plan:   Problem List Items Addressed This Visit   None Visit Diagnoses     Shortness of breath    -  Primary   Relevant Medications   albuterol (VENTOLIN HFA) 108 (90 Base) MCG/ACT inhaler 4 puff (Completed) (Start on 10/05/2021 11:00 AM)   Other Relevant Orders   PR EVAL OF BRONCHOSPASM       No follow-ups on file.    Tandy Gaw, PA-C

## 2021-10-05 NOTE — Telephone Encounter (Signed)
Task completed. Patient had Spirometry testing today during a Nurse visit.

## 2021-10-06 DIAGNOSIS — E039 Hypothyroidism, unspecified: Secondary | ICD-10-CM | POA: Insufficient documentation

## 2021-10-11 ENCOUNTER — Other Ambulatory Visit: Payer: Self-pay | Admitting: Neurology

## 2021-10-11 DIAGNOSIS — F418 Other specified anxiety disorders: Secondary | ICD-10-CM

## 2021-10-11 NOTE — Telephone Encounter (Signed)
Patient left vm stating at her visit on 10/05/2021 with Lesly Rubenstein she discussed pain management for back pain and was going to send in something to help. I don't see anything in the note except Solumedrol injection that was given that day. She is 8 months pregnant. How should we proceed?

## 2021-10-12 NOTE — Telephone Encounter (Signed)
LVM for patient to call back to discuss.

## 2021-10-12 NOTE — Telephone Encounter (Signed)
I think we should refer her for Ortho or sports med evaluation.  She really cannot take frequent NSAIDs because of the pregnancy and it sounds like she is actually having some radiculopathy symptoms.  Not sure if she has tried physical therapy.  But I will be honest I am not sure how helpful it is going to be at this point until after she has the baby which is scheduled for next month.  But they could at least do some treatments to help with pain relief with physical therapy.  Be to place a referral for physical therapy that they could at least start and then can always pick back up after delivery.  She can also utilize her extra strength Tylenol heat and/or ice.

## 2021-10-17 ENCOUNTER — Encounter: Payer: Self-pay | Admitting: Neurology

## 2021-10-22 MED ORDER — FLUOXETINE HCL 20 MG PO CAPS: 40.0000 mg | ORAL_CAPSULE | Freq: Two times a day (BID) | ORAL | 2 refills | Status: DC

## 2021-10-22 NOTE — Telephone Encounter (Signed)
Patient called back and left vm returning call to discuss pain control.   Spoke with patient about message from Dr. Linford Arnold and she declines PT. Will schedule an appt after she gives birth to discuss pain control.   She also states her Prozac was increased to 80 mg daily (2 tablets twice daily instead of 3 tablets once daily) there is nothing in her record stating this change.   She states this was managed by Dr. Evelene Croon, it was changed a year ago, but cheaper to get this through [facility]. Okay to send RX with change? RX pended.

## 2021-10-22 NOTE — Addendum Note (Signed)
Addended bySilvio Pate on: 10/22/2021 01:58 PM   Modules accepted: Orders

## 2021-10-26 ENCOUNTER — Encounter: Payer: Medicaid Other | Admitting: Physician Assistant

## 2021-11-05 ENCOUNTER — Ambulatory Visit (HOSPITAL_COMMUNITY)
Admission: EM | Admit: 2021-11-05 | Discharge: 2021-11-05 | Disposition: A | Discharge status: Home / Self Care | Payer: Medicaid Other | Attending: Psychiatry | Admitting: Psychiatry

## 2021-11-05 DIAGNOSIS — F331 Major depressive disorder, recurrent, moderate: Secondary | ICD-10-CM | POA: Insufficient documentation

## 2021-11-05 DIAGNOSIS — F419 Anxiety disorder, unspecified: Secondary | ICD-10-CM | POA: Insufficient documentation

## 2021-11-05 NOTE — Discharge Instructions (Addendum)
Please present to one of the following facilities to start medication management and therapy services:   [facility] [address],  [place], [place] [address] [phone] [address] [place],  [place], [place] [address]  [phone]    [facility] shelter for women  [address] [place] [address] [phone]  National Domestic Violence Help line [phone]
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
## 2021-11-21 ENCOUNTER — Telehealth: Payer: Self-pay | Admitting: General Practice

## 2021-11-21 NOTE — Telephone Encounter (Signed)
Transition Care Management Unsuccessful Follow-up Telephone Call  Date of discharge and from where:  11/16/21 from [facility]  Attempts:  1st Attempt  Reason for unsuccessful TCM follow-up call:  Left voice message

## 2021-11-23 NOTE — Telephone Encounter (Signed)
Transition Care Management Unsuccessful Follow-up Telephone Call  Date of discharge and from where:  11/16/21 from [facility]  Attempts:  1st Attempt  Reason for unsuccessful TCM follow-up call:  Left voice message    

## 2021-11-26 NOTE — Telephone Encounter (Signed)
Transition Care Management Unsuccessful Follow-up Telephone Call  Date of discharge and from where:  11/16/21 from [facility]  Attempts:  2nd Attempt  Reason for unsuccessful TCM follow-up call:  Left voice message

## 2021-12-11 ENCOUNTER — Encounter: Payer: Self-pay | Admitting: Physician Assistant

## 2021-12-11 ENCOUNTER — Ambulatory Visit (INDEPENDENT_AMBULATORY_CARE_PROVIDER_SITE_OTHER): Payer: Medicaid Other | Admitting: Physician Assistant

## 2021-12-11 VITALS — BP 133/89 | HR 108 | Temp 97.5°F | Ht 63.0 in | Wt 167.0 lb

## 2021-12-11 DIAGNOSIS — K580 Irritable bowel syndrome with diarrhea: Secondary | ICD-10-CM | POA: Diagnosis not present

## 2021-12-11 DIAGNOSIS — F418 Other specified anxiety disorders: Secondary | ICD-10-CM | POA: Diagnosis not present

## 2021-12-11 DIAGNOSIS — G4700 Insomnia, unspecified: Secondary | ICD-10-CM

## 2021-12-11 DIAGNOSIS — I10 Essential (primary) hypertension: Secondary | ICD-10-CM | POA: Diagnosis not present

## 2021-12-11 DIAGNOSIS — H6091 Unspecified otitis externa, right ear: Secondary | ICD-10-CM | POA: Diagnosis not present

## 2021-12-11 DIAGNOSIS — F902 Attention-deficit hyperactivity disorder, combined type: Secondary | ICD-10-CM

## 2021-12-11 MED ORDER — DICYCLOMINE HCL 10 MG PO CAPS: 10.0000 mg | ORAL_CAPSULE | Freq: Two times a day (BID) | ORAL | 2 refills | Status: AC

## 2021-12-11 MED ORDER — METHYLPREDNISOLONE SODIUM SUCC 125 MG IJ SOLR
125.0000 mg | Freq: Once | INTRAMUSCULAR | Status: AC
  Administered 2021-12-11: 125 mg via INTRAMUSCULAR

## 2021-12-11 MED ORDER — ACETIC ACID 2 % OT SOLN: 4.0000 [drp] | Freq: Three times a day (TID) | OTIC | 0 refills | Status: DC

## 2021-12-11 MED ORDER — HYDROCHLOROTHIAZIDE 25 MG PO TABS: 25.0000 mg | ORAL_TABLET | Freq: Every day | ORAL | 1 refills | Status: DC
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
## 2021-12-12 ENCOUNTER — Encounter: Payer: Self-pay | Admitting: Physician Assistant

## 2021-12-17 ENCOUNTER — Encounter: Payer: Self-pay | Admitting: Physician Assistant

## 2021-12-17 ENCOUNTER — Telehealth: Payer: Self-pay | Admitting: Neurology

## 2021-12-17 DIAGNOSIS — H6091 Unspecified otitis externa, right ear: Secondary | ICD-10-CM | POA: Insufficient documentation

## 2021-12-17 NOTE — Progress Notes (Signed)
Note sent to Dr. Toy Care to request okay for Melissa Sharp to prescribe medications. Awaiting word back.

## 2021-12-17 NOTE — Telephone Encounter (Signed)
Patient left vm stating Ramin Zoll discussed ADHD treatment with her but she didn't hear anything. We did send a request to Dr. Toy Care to see if okay to prescribe medications.

## 2021-12-18 NOTE — Telephone Encounter (Signed)
Received note from Dr. Starleen Arms office stating:  " Spoke with Dr. Toy Care and she does not feel that Adderall or Klonopin would be a good idea for the patient at this time. Dr. Toy Care adjusted the patients medications today and hopefully the changes will make her feel less sedated. Genever is willing to give the change some time"  Patient made aware, will continue medication management through Dr. Starleen Arms office until she is stable on medicines.

## 2021-12-19 ENCOUNTER — Telehealth: Payer: Self-pay | Admitting: Neurology

## 2021-12-19 NOTE — Telephone Encounter (Signed)
Patient left vm stating the medication given for IBS isn't helping. She is taking twice daily as prescribed and she is still almost having accidents of fecal incontinence. Please advise? [phone].

## 2021-12-20 MED ORDER — DIPHENOXYLATE-ATROPINE 2.5-0.025 MG PO TABS: ORAL_TABLET | ORAL | 0 refills | Status: DC

## 2021-12-20 NOTE — Telephone Encounter (Signed)
Patient made aware of recommendations from Kawanda Drumheller.  °

## 2021-12-28 ENCOUNTER — Telehealth: Payer: Self-pay

## 2021-12-28 NOTE — Telephone Encounter (Signed)
Patient left a vm msg requesting from provider a non-stimulant ADHD medication. Per patient, due to cost of the rx ($85), prefers to get rx from provider. Patient stated that her therapist, Dr. Robina Ade was okay with provider writing her rx.

## 2021-12-29 NOTE — Telephone Encounter (Signed)
Pls get records for Dr. Toy Care since Luvenia Starch will be taking over the rx.

## 2022-01-01 NOTE — Telephone Encounter (Signed)
Patient send another message about this and wanting Luvenia Starch to send Strattera. Sent a request to Dr. Starleen Arms office to send latest records and okay for [facility] to write. When this was last discussed two weeks ago Dr. Toy Care wanted the patient to give medication adjustment more time to see if that helped sedation. Will wait on answer from Dr. Toy Care. [place].

## 2022-01-02 NOTE — Telephone Encounter (Signed)
Dr. Toy Care has actually started patient on Strattera and given meds for the next 3 months, I spoke with Judson Roch at her office [phone]). She has called them multiple times to change mood medications and they feel it would be best to have Dr. Toy Care manage this prescription until patient is stable. Follow up is not dependent on the Strattera, but more her mood medication, so having Dr. Toy Care write this RX should not make a difference financially for the patient.

## 2022-01-03 NOTE — Telephone Encounter (Signed)
Called patient and made her aware.

## 2022-01-10 ENCOUNTER — Telehealth (INDEPENDENT_AMBULATORY_CARE_PROVIDER_SITE_OTHER): Payer: Medicaid Other | Admitting: Family Medicine

## 2022-01-10 ENCOUNTER — Encounter: Payer: Self-pay | Admitting: Family Medicine

## 2022-01-10 DIAGNOSIS — J40 Bronchitis, not specified as acute or chronic: Secondary | ICD-10-CM | POA: Diagnosis not present

## 2022-01-10 MED ORDER — BENZONATATE 200 MG PO CAPS: 200.0000 mg | ORAL_CAPSULE | Freq: Two times a day (BID) | ORAL | 0 refills | Status: DC | PRN

## 2022-01-10 MED ORDER — PREDNISONE 50 MG PO TABS: ORAL_TABLET | ORAL | 0 refills | Status: DC

## 2022-01-10 NOTE — Progress Notes (Signed)
Current symptoms: difficulty breathing, possible bronchitis (yearly occurrence), productive cough (no color).

## 2022-01-10 NOTE — Assessment & Plan Note (Signed)
Start prednisone burst.  Albuterol as needed.  Tessalon Perles renewed.  She may use Delsym during the day as needed for cough.  Recommend supportive care with increase fluids, humidifier at home and nasal saline rinses.

## 2022-01-10 NOTE — Progress Notes (Signed)
Melissa Sharp - 41 y.o. female MRN [id number]  Date of birth: 10-13-1980   This visit type was conducted due to national recommendations for restrictions regarding the COVID-19 Pandemic (e.g. social distancing).  This format is felt to be most appropriate for this patient at this time.  All issues noted in this document were discussed and addressed.  No physical exam was performed (except for noted visual exam findings with Video Visits).  I discussed the limitations of evaluation and management by telemedicine and the availability of in person appointments. The patient expressed understanding and agreed to proceed.  I connected withNAME@ on 01/10/22 at 11:30 AM EDT by a video enabled telemedicine application and verified that I am speaking with the correct person using two identifiers.  Present at visit: Everrett Coombe, DO Melissa Sharp   Patient Location: Home [address] [place] [address]   Provider location:   [facility]  Chief Complaint  Patient presents with   URI    HPI  Jozie Wulf is a 41 y.o. female who presents via audio/video conferencing for a telehealth visit today.  She has complaint of cough, congestion, occasional wheezing without shortness of breath.  Reports symptoms started about a week ago.  She has not had any fever, chills, nausea, vomiting, headache or sinus pain with this.  She has not really tried anything for management of symptoms so far.  She does report that this feels similar to prior episodes of bronchitis that she has had in the past.  This is responded well to steroids and/or antibiotics in the past.   ROS:  A comprehensive ROS was completed and negative except as noted per HPI  Past Medical History:  Diagnosis Date   Anxiety    Chalazion left upper eyelid 08/26/2018   Depression    Hypothyroidism    Migraine    PIH (pregnancy induced hypertension) 07/28/2015   Preeclampsia    Sinobronchitis 09/24/2019   Thyroid activity decreased  10/27/2013    Past Surgical History:  Procedure Laterality Date   TONSILECTOMY/ADENOIDECTOMY WITH MYRINGOTOMY      Family History  Problem Relation Age of Onset   Atrial fibrillation Mother    Alcohol abuse Father    Pancreatic cancer Father    Hyperlipidemia Other    Migraines Neg Hx     Social History   Socioeconomic History   Marital status: Married    Spouse name: Ivin Booty   Number of children: 0   Years of education: Not on file   Highest education level: Not on file  Occupational History   Occupation: Designer, television/film set  Tobacco Use   Smoking status: Never   Smokeless tobacco: Never  Substance and Sexual Activity   Alcohol use: Yes    Comment: 5 drinks a week   Drug use: No   Sexual activity: Yes    Birth control/protection: None  Other Topics Concern   Not on file  Social History Narrative   Lives w/ husband   Social Determinants of Health   Financial Resource Strain: Not on file  Food Insecurity: Not on file  Transportation Needs: Not on file  Physical Activity: Not on file  Stress: Not on file  Social Connections: Not on file  Intimate Partner Violence: Not on file     Current Outpatient Medications:    acetic acid 2 % otic solution, Place 4 drops into both ears 3 (three) times daily., Disp: 15 mL, Rfl: 0   albuterol (VENTOLIN HFA) 108 (90 Base)  MCG/ACT inhaler, Inhale 2 puffs into the lungs every 6 (six) hours as needed for wheezing., Disp: 2 each, Rfl: 11   amphetamine-dextroamphetamine (ADDERALL) 15 MG tablet, Take 1 tablet by mouth 2 (two) times daily., Disp: , Rfl:    benzonatate (TESSALON) 200 MG capsule, Take 1 capsule (200 mg total) by mouth 2 (two) times daily as needed for cough., Disp: 20 capsule, Rfl: 0   cyclobenzaprine (FLEXERIL) 10 MG tablet, TAKE ONE TABLET BY MOUTH TWICE A DAY AS NEEDED FOR MUSCLE SPASMS, Disp: 45 tablet, Rfl: 0   dicyclomine (BENTYL) 10 MG capsule, Take 1 capsule (10 mg total) by mouth 2 (two) times daily., Disp: 60  capsule, Rfl: 2   diphenoxylate-atropine (LOMOTIL) 2.5-0.025 MG tablet, One to 2 tablets by mouth 4 times a day as needed for diarrhea., Disp: 60 tablet, Rfl: 0   divalproex (DEPAKOTE ER) 500 MG 24 hr tablet, Take by mouth., Disp: , Rfl:    fluticasone (FLONASE) 50 MCG/ACT nasal spray, Place 2 sprays into both nostrils daily., Disp: 16 g, Rfl: 2   hydrochlorothiazide (HYDRODIURIL) 25 MG tablet, Take 1 tablet (25 mg total) by mouth daily., Disp: 90 tablet, Rfl: 1   hydrOXYzine (VISTARIL) 25 MG capsule, Take 25-50 mg by mouth at bedtime., Disp: , Rfl:    ipratropium (ATROVENT) 0.06 % nasal spray, Place 2 sprays into both nostrils 4 (four) times daily., Disp: 15 mL, Rfl: 11   levothyroxine (SYNTHROID) 50 MCG tablet, TAKE ONE TABLET BY MOUTH EVERY MORNING BEFORE BREAKFAST, Disp: 90 tablet, Rfl: 3   montelukast (SINGULAIR) 10 MG tablet, Take 1 tablet (10 mg total) by mouth at bedtime., Disp: 90 tablet, Rfl: 3   OLANZapine (ZYPREXA) 15 MG tablet, Take 15 mg by mouth at bedtime., Disp: , Rfl:    predniSONE (DELTASONE) 50 MG tablet, Take 1 tab po daily x5 days, Disp: 5 tablet, Rfl: 0   traZODone (DESYREL) 100 MG tablet, Take 100 mg by mouth at bedtime as needed., Disp: , Rfl:   EXAM:  VITALS per patient if applicable: Ht 5\' 3"  (1.6 m)   Wt 156 lb (70.8 kg)   LMP 12/29/2020   BMI 27.63 kg/m   GENERAL: alert, oriented, appears well and in no acute distress  HEENT: atraumatic, conjunttiva clear, no obvious abnormalities on inspection of external nose and ears  NECK: normal movements of the head and neck  LUNGS: on inspection no signs of respiratory distress, breathing rate appears normal, no obvious gross SOB, gasping or wheezing  CV: no obvious cyanosis  MS: moves all visible extremities without noticeable abnormality  PSYCH/NEURO: pleasant and cooperative, no obvious depression or anxiety, speech and thought processing grossly intact  ASSESSMENT AND PLAN:  Discussed the following  assessment and plan:  Bronchitis Start prednisone burst.  Albuterol as needed.  Tessalon Perles renewed.  She may use Delsym during the day as needed for cough.  Recommend supportive care with increase fluids, humidifier at home and nasal saline rinses.     I discussed the assessment and treatment plan with the patient. The patient was provided an opportunity to ask questions and all were answered. The patient agreed with the plan and demonstrated an understanding of the instructions.   The patient was advised to call back or seek an in-person evaluation if the symptoms worsen or if the condition fails to improve as anticipated.    Luetta Nutting, DO

## 2022-01-11 ENCOUNTER — Telehealth: Payer: Medicaid Other | Admitting: Physician Assistant

## 2022-01-23 ENCOUNTER — Telehealth: Payer: Self-pay | Admitting: Neurology

## 2022-01-23 NOTE — Telephone Encounter (Signed)
Patient left vm yesterday stating she needs bipolar medication written by Lesly Rubenstein because Dr. Evelene Croon is not covered by her insurance and her medication is $1500.   I called patient back and LVM letting her know (again) the cost of medication is not determined by the provider who writes it and Lesly Rubenstein can not take over prescribing this medication. She is to call back with any questions.

## 2022-01-25 ENCOUNTER — Ambulatory Visit: Payer: Medicaid Other | Admitting: Physician Assistant

## 2022-01-28 ENCOUNTER — Other Ambulatory Visit: Payer: Self-pay | Admitting: Sports Medicine

## 2022-01-28 DIAGNOSIS — J40 Bronchitis, not specified as acute or chronic: Secondary | ICD-10-CM

## 2022-01-30 ENCOUNTER — Ambulatory Visit (INDEPENDENT_AMBULATORY_CARE_PROVIDER_SITE_OTHER): Payer: Medicaid Other | Admitting: Physician Assistant

## 2022-01-30 ENCOUNTER — Encounter: Payer: Self-pay | Admitting: Physician Assistant

## 2022-01-30 VITALS — BP 126/70 | HR 99 | Temp 98.6°F | Ht 63.0 in | Wt 162.0 lb

## 2022-01-30 DIAGNOSIS — J329 Chronic sinusitis, unspecified: Secondary | ICD-10-CM | POA: Diagnosis not present

## 2022-01-30 DIAGNOSIS — Z20828 Contact with and (suspected) exposure to other viral communicable diseases: Secondary | ICD-10-CM

## 2022-01-30 DIAGNOSIS — J40 Bronchitis, not specified as acute or chronic: Secondary | ICD-10-CM | POA: Diagnosis not present

## 2022-01-30 DIAGNOSIS — R0981 Nasal congestion: Secondary | ICD-10-CM | POA: Diagnosis not present

## 2022-01-30 DIAGNOSIS — R051 Acute cough: Secondary | ICD-10-CM | POA: Diagnosis not present

## 2022-01-30 DIAGNOSIS — Z20818 Contact with and (suspected) exposure to other bacterial communicable diseases: Secondary | ICD-10-CM | POA: Diagnosis not present

## 2022-01-30 LAB — POCT INFLUENZA A/B
Influenza A, POC: NEGATIVE
Influenza B, POC: NEGATIVE

## 2022-01-30 LAB — POC COVID19 BINAXNOW: SARS Coronavirus 2 Ag: NEGATIVE

## 2022-01-30 LAB — POCT RAPID STREP A (OFFICE): Rapid Strep A Screen: NEGATIVE

## 2022-01-30 MED ORDER — DOXYCYCLINE HYCLATE 100 MG PO TABS: 100.0000 mg | ORAL_TABLET | Freq: Two times a day (BID) | ORAL | 0 refills | Status: DC

## 2022-01-30 MED ORDER — BENZONATATE 200 MG PO CAPS: 200.0000 mg | ORAL_CAPSULE | Freq: Two times a day (BID) | ORAL | 0 refills | Status: DC | PRN

## 2022-01-30 MED ORDER — METHYLPREDNISOLONE SODIUM SUCC 125 MG IJ SOLR
125.0000 mg | Freq: Once | INTRAMUSCULAR | Status: AC
  Administered 2022-01-30: 125 mg via INTRAMUSCULAR

## 2022-01-30 NOTE — Progress Notes (Signed)
Acute Office Visit  Subjective:     Patient ID: Melissa Sharp, female    DOB: 05-24-1980, 41 y.o.   MRN: [id number]  Chief Complaint  Patient presents with   Cough    Congestion     HPI Patient is in today for cough, congestion, sinus pressure, throat drainage and fatigue. She has had cough and congestion for months but feeling worse these last few days. Her husband has strep right now. She had acute visit last month for similar symptoms and given prednisone. She felt better then felt worse. No CP, body aches. Her chest is tight and albuterol does help. She is using lots of OTC home remedy treatments.  .. Active Ambulatory Problems    Diagnosis Date Noted   IBS (irritable bowel syndrome) 10/27/2013   Migraine without aura, without mention of intractable migraine without mention of status migrainosus 10/29/2013   Esophageal reflux 10/29/2013   Depression, major, recurrent (HCC) 10/29/2013   Mixed anxiety depressive disorder 10/29/2013   B12 deficiency 10/29/2013   Hypertriglyceridemia 10/29/2013   Elevated liver enzymes 11/02/2013   Poor venous access 01/24/2015   Post concussive syndrome 05/21/2015   Cervical pain (neck) 06/26/2015   Intractable migraine 06/30/2015   Migraine with status migrainosus 07/15/2015   Diastasis recti 02/20/2016   Low back pain with radiation, unspecified laterality 02/23/2016   Overweight (BMI 25.0-29.9) 03/20/2016   Elevated blood pressure reading 05/03/2016   Tachycardia with heart rate 100-120 beats per minute 02/25/2017   Tapeworm infection 02/25/2017   Non-seasonal allergic rhinitis 05/08/2017   Anxiety 05/08/2017   Functional diarrhea 07/01/2017   Right upper quadrant guarding 07/01/2017   Bloating 07/01/2017   Nausea 07/01/2017   Cervical radiculopathy 03/15/2018   Muscle weakness of left upper extremity 03/15/2018   Hypertension goal BP (blood pressure) < 130/80 03/15/2018   Influenza-like illness 05/22/2018   Annular skin lesion  06/25/2018   Tinea corporis 11/19/2019   Recurrent isolated sleep paralysis 12/23/2019   Staring episodes 12/23/2019   Chronic fatigue 12/23/2019   Acute abdominal pain 03/22/2020   Attention deficit hyperactivity disorder (ADHD), combined type 05/30/2020   Female fertility problems 05/30/2020   Bilateral calf pain 10/13/2020   Body aches 10/13/2020   Weakness 10/13/2020   Pneumonia due to COVID-19 virus 10/25/2020   Elevated brain natriuretic peptide (BNP) level 10/25/2020   Low hemoglobin 10/25/2020   Nonintractable headache 10/25/2020   Palpitations 10/25/2020   Acute recurrent pansinusitis 01/25/2021   Primary hypertension 01/25/2021   Chronic bilateral low back pain without sciatica 01/25/2021   Bronchitis 08/15/2021   Right foot pain 08/15/2021   Chronic left-sided low back pain with left-sided sciatica 10/05/2021   Hypothyroidism 10/06/2021   Inflammation of right ear canal 12/17/2021   Resolved Ambulatory Problems    Diagnosis Date Noted   Thyroid activity decreased 10/27/2013   Pregnancy 01/24/2015   Viral gastroenteritis 01/24/2015   Nausea and vomiting during pregnancy 03/07/2015   Dizziness 03/07/2015   Dehydration 03/07/2015   Musculoskeletal neck pain 06/26/2015   PIH (pregnancy induced hypertension) 07/28/2015   Chalazion left upper eyelid 08/26/2018   Sinobronchitis 09/24/2019   SOB (shortness of breath) on exertion 10/13/2020   Cough 10/13/2020   SOB (shortness of breath) 10/25/2020   Past Medical History:  Diagnosis Date   Depression    Migraine    Preeclampsia     ROS  See HPI.     Objective:    BP 126/70   Pulse 99   Temp  98.6 F (37 C) (Oral)   Ht 5\' 3"  (1.6 m)   Wt 162 lb (73.5 kg)   SpO2 98%   BMI 28.70 kg/m  BP Readings from Last 3 Encounters:  01/30/22 126/70  12/11/21 133/89  10/05/21 124/82   Wt Readings from Last 3 Encounters:  01/30/22 162 lb (73.5 kg)  01/10/22 156 lb (70.8 kg)  12/11/21 167 lb (75.8 kg)     .12/13/21 Results for orders placed or performed in visit on 01/30/22  POC COVID-19  Result Value Ref Range   SARS Coronavirus 2 Ag Negative Negative  POCT rapid strep A  Result Value Ref Range   Rapid Strep A Screen Negative Negative  POCT Influenza A/B  Result Value Ref Range   Influenza A, POC Negative Negative   Influenza B, POC Negative Negative     Physical Exam Constitutional:      Appearance: Normal appearance.  HENT:     Head: Normocephalic.     Right Ear: Tympanic membrane, ear canal and external ear normal. There is no impacted cerumen.     Left Ear: Tympanic membrane, ear canal and external ear normal. There is no impacted cerumen.     Nose: Congestion and rhinorrhea present.     Mouth/Throat:     Mouth: Mucous membranes are moist.     Pharynx: Posterior oropharyngeal erythema present. No oropharyngeal exudate.  Eyes:     Conjunctiva/sclera: Conjunctivae normal.  Neck:     Vascular: No carotid bruit.  Cardiovascular:     Rate and Rhythm: Normal rate and regular rhythm.  Pulmonary:     Effort: Pulmonary effort is normal.     Breath sounds: Rhonchi present.  Musculoskeletal:     Cervical back: Normal range of motion and neck supple. Tenderness present.     Right lower leg: No edema.     Left lower leg: Edema present.  Lymphadenopathy:     Cervical: Cervical adenopathy present.  Skin:    Comments: Flushed cheeks  Neurological:     General: No focal deficit present.     Mental Status: She is alert and oriented to person, place, and time.  Psychiatric:        Mood and Affect: Mood normal.          Assessment & Plan:  11/17/23Marland KitchenMardy was seen today for cough.  Diagnoses and all orders for this visit:  Sinobronchitis -     doxycycline (VIBRA-TABS) 100 MG tablet; Take 1 tablet (100 mg total) by mouth 2 (two) times daily. -     methylPREDNISolone sodium succinate (SOLU-MEDROL) 125 mg/2 mL injection 125 mg  Acute cough -     POC COVID-19 -     POCT rapid strep  A -     POCT Influenza A/B -     benzonatate (TESSALON) 200 MG capsule; Take 1 capsule (200 mg total) by mouth 2 (two) times daily as needed for cough. -     methylPREDNISolone sodium succinate (SOLU-MEDROL) 125 mg/2 mL injection 125 mg  Congestion of nasal sinus -     POC COVID-19 -     POCT rapid strep A -     POCT Influenza A/B -     methylPREDNISolone sodium succinate (SOLU-MEDROL) 125 mg/2 mL injection 125 mg  Exposure to strep throat  Exposure to the flu   Negative for covid/flu/strep Pt appears to have had viral illness with now secondary sickening Treated with doxycycline and solumedrol IM Continue using albuterol inhaler as  needed ever 4-6 hours Continue with symptomatic care Rest and hydrate Follow up as needed and if symptoms persist or worsen   Tandy Gaw, PA-C

## 2022-03-06 ENCOUNTER — Telehealth (INDEPENDENT_AMBULATORY_CARE_PROVIDER_SITE_OTHER): Payer: Medicaid Other | Admitting: Family Medicine

## 2022-03-06 ENCOUNTER — Encounter: Payer: Self-pay | Admitting: Family Medicine

## 2022-03-06 VITALS — Temp 102.0°F | Ht 63.0 in | Wt 163.0 lb

## 2022-03-06 DIAGNOSIS — J111 Influenza due to unidentified influenza virus with other respiratory manifestations: Secondary | ICD-10-CM | POA: Diagnosis not present

## 2022-03-06 MED ORDER — OSELTAMIVIR PHOSPHATE 75 MG PO CAPS: 75.0000 mg | ORAL_CAPSULE | Freq: Two times a day (BID) | ORAL | 0 refills | Status: DC

## 2022-03-06 NOTE — Progress Notes (Signed)
Pt states that she been having flu symptoms since Sunday and and yesterday she has been having fever cough and congestion with green mucus , highest fever was102.0 F, pt states she has ben taking tylenol and ibuprofen and Theraflu as needed pt states she been a episodes of diarrhea on and off.

## 2022-03-06 NOTE — Progress Notes (Signed)
I connected with  [name] on 03/06/22 by a video enabled telemedicine application and verified that I am speaking with the correct person using two identifiers.   I discussed the limitations of evaluation and management by telemedicine. The patient expressed understanding and agreed to proceed.  [facility] Acute Office Visit  Subjective:     Patient ID: Melissa Sharp, female    DOB: 02-13-1981, 41 y.o.   MRN: [id number]  Chief Complaint  Patient presents with   Influenza    Influenza Associated symptoms include chills, coughing, a fever and myalgias. Pertinent negatives include no chest pain, congestion or sore throat.   Patient is in today for acute visit.  Pt connected with myself on telehealth. She reports on Saturday she felt fatigue and malaise. She felt like she was hit by a bus. She then had sneezing and rhinorrhea. On Sunday she felt like she had a slight cold. Started doing Tylenol and Advil alternating. Her 2 daughters and husband all have been diagnosed with the flu as of yesterday. She now has chills, fevers and sweats. She is blowing her nose productive of green mucus. She also has body aches.   Review of Systems  Constitutional:  Positive for chills, fever and malaise/fatigue.  HENT:  Negative for congestion, sinus pain and sore throat.   Respiratory:  Positive for cough. Negative for shortness of breath and wheezing.   Cardiovascular:  Negative for chest pain.  Musculoskeletal:  Positive for myalgias.  All other systems reviewed and are negative.       Objective:    Temp (!) 102 F (38.9 C) (Oral)   Ht 5\' 3"  (1.6 m)   Wt 163 lb (73.9 kg)   BMI 28.87 kg/m    Physical Exam Vitals and nursing note reviewed.  Constitutional:      General: She is not in acute distress.    Appearance: Normal appearance. She is normal weight. She is ill-appearing.  HENT:     Head: Normocephalic and atraumatic.     Right Ear: External ear normal.     Left Ear: External ear  normal.  Eyes:     Extraocular Movements: Extraocular movements intact.     Conjunctiva/sclera: Conjunctivae normal.  Pulmonary:     Effort: Pulmonary effort is normal. No respiratory distress.  Neurological:     General: No focal deficit present.     Mental Status: She is alert and oriented to person, place, and time. Mental status is at baseline.  Psychiatric:        Mood and Affect: Mood normal.        Behavior: Behavior normal.        Thought Content: Thought content normal.        Judgment: Judgment normal.    No results found for any visits on 03/06/22.      Assessment & Plan:   Problem List Items Addressed This Visit   None Visit Diagnoses     Influenza    -  Primary   Relevant Medications   oseltamivir (TAMIFLU) 75 MG capsule       Meds ordered this encounter  Medications   oseltamivir (TAMIFLU) 75 MG capsule    Sig: Take 1 capsule (75 mg total) by mouth 2 (two) times daily.    Dispense:  10 capsule    Refill:  0  Tamiflu x 5 days.  Fluids and rest May continue alternating Tylenol and Advil prn for fever and aches.   No follow-ups on file.  Leeanne Rio, MD

## 2022-03-12 ENCOUNTER — Ambulatory Visit (INDEPENDENT_AMBULATORY_CARE_PROVIDER_SITE_OTHER): Payer: Medicaid Other | Admitting: Family Medicine

## 2022-03-12 ENCOUNTER — Ambulatory Visit (INDEPENDENT_AMBULATORY_CARE_PROVIDER_SITE_OTHER): Payer: Medicaid Other

## 2022-03-12 ENCOUNTER — Encounter: Payer: Self-pay | Admitting: Family Medicine

## 2022-03-12 ENCOUNTER — Other Ambulatory Visit: Payer: Self-pay | Admitting: Neurology

## 2022-03-12 VITALS — BP 96/62 | HR 96 | Temp 98.0°F | Ht 63.0 in | Wt 157.2 lb

## 2022-03-12 DIAGNOSIS — J40 Bronchitis, not specified as acute or chronic: Secondary | ICD-10-CM | POA: Diagnosis not present

## 2022-03-12 DIAGNOSIS — R051 Acute cough: Secondary | ICD-10-CM

## 2022-03-12 DIAGNOSIS — J189 Pneumonia, unspecified organism: Secondary | ICD-10-CM

## 2022-03-12 MED ORDER — IPRATROPIUM BROMIDE 0.06 % NA SOLN: 2.0000 | Freq: Four times a day (QID) | NASAL | 11 refills | Status: DC

## 2022-03-12 MED ORDER — PROMETHAZINE-DM 6.25-15 MG/5ML PO SYRP: 5.0000 mL | ORAL_SOLUTION | Freq: Every evening | ORAL | 0 refills | Status: DC | PRN

## 2022-03-12 MED ORDER — IPRATROPIUM BROMIDE 0.06 % NA SOLN: 2.0000 | Freq: Four times a day (QID) | NASAL | 3 refills | Status: DC

## 2022-03-12 MED ORDER — AZITHROMYCIN 250 MG PO TABS: ORAL_TABLET | ORAL | 0 refills | Status: AC

## 2022-03-12 MED ORDER — METHYLPREDNISOLONE 4 MG PO TBPK: ORAL_TABLET | ORAL | 0 refills | Status: DC

## 2022-03-12 MED ORDER — BENZONATATE 200 MG PO CAPS: 200.0000 mg | ORAL_CAPSULE | Freq: Two times a day (BID) | ORAL | 0 refills | Status: AC | PRN

## 2022-03-12 MED ORDER — ALBUTEROL SULFATE HFA 108 (90 BASE) MCG/ACT IN AERS: 2.0000 | INHALATION_SPRAY | Freq: Four times a day (QID) | RESPIRATORY_TRACT | 11 refills | Status: DC | PRN

## 2022-03-12 MED ORDER — ALBUTEROL SULFATE HFA 108 (90 BASE) MCG/ACT IN AERS: 2.0000 | INHALATION_SPRAY | Freq: Four times a day (QID) | RESPIRATORY_TRACT | 5 refills | Status: DC | PRN

## 2022-03-12 NOTE — Telephone Encounter (Signed)
Patient called and LVM wanting refills of "nasal spray and inhaler" sent to [facility]. Sent Atrovent and Albuterol to pharmacy.

## 2022-03-12 NOTE — Progress Notes (Addendum)
Acute Office Visit  Subjective:     Patient ID: Melissa Sharp, female    DOB: Oct 08, 1980, 41 y.o.   MRN: [id number]  Chief Complaint  Patient presents with   Acute Visit    Still not feeling well after flu diagnosis on Wednesday 03/06/22. Dyspnea, cough, weakness, congestion, green  nasal mucus - only improvement in bodyaches and slight improvement in cough and no longer having fevers but other symptoms are the same. New symptom of loss of taste and smell.     HPI Patient is in today for acute visit.  Pt seen on 12/20 for mychart video visit. Pt reports she had flu exposure at home with her husband and daughter. She had temp of 102 at that time with cough. She was given Tamiflu and has completed this, but still has symptoms. She has left sided facial pressure worse when coughing. No sense of taste or smell. She also has some nasal congestion producing greenish phlegm. No chest pains or SOB. Bodyaches and fever have resolved. She was also given Tessalon perles to use that helped some. Cough worse at night and requests something for cough at night.   Past Medical History:  Diagnosis Date   Anxiety    Chalazion left upper eyelid 08/26/2018   Depression    Hypothyroidism    Migraine    PIH (pregnancy induced hypertension) 07/28/2015   Preeclampsia    Sinobronchitis 09/24/2019   Thyroid activity decreased 10/27/2013     Review of Systems  Constitutional:  Positive for malaise/fatigue. Negative for chills and fever.  HENT:  Positive for congestion and sinus pain.   Respiratory:  Positive for cough and sputum production. Negative for shortness of breath and wheezing.   Neurological:  Positive for headaches.  All other systems reviewed and are negative.       Objective:    BP 96/62   Pulse 96   Temp 98 F (36.7 C)   Ht 5\' 3"  (1.6 m)   Wt 157 lb 4 oz (71.3 kg)   SpO2 95%   BMI 27.86 kg/m    Physical Exam Vitals and nursing note reviewed.  Constitutional:      Appearance:  Normal appearance. She is normal weight. She is ill-appearing.  HENT:     Head: Normocephalic and atraumatic.     Right Ear: Tympanic membrane, ear canal and external ear normal.     Left Ear: Tympanic membrane and ear canal normal.     Nose: Congestion present.     Mouth/Throat:     Mouth: Mucous membranes are moist.     Pharynx: Oropharynx is clear. No oropharyngeal exudate or posterior oropharyngeal erythema.  Eyes:     Conjunctiva/sclera: Conjunctivae normal.     Pupils: Pupils are equal, round, and reactive to light.  Cardiovascular:     Rate and Rhythm: Normal rate and regular rhythm.     Pulses: Normal pulses.     Heart sounds: Normal heart sounds.  Pulmonary:     Effort: Pulmonary effort is normal. No respiratory distress.     Breath sounds: Wheezing present.  Skin:    General: Skin is warm.     Capillary Refill: Capillary refill takes less than 2 seconds.  Neurological:     General: No focal deficit present.     Mental Status: She is alert and oriented to person, place, and time. Mental status is at baseline.  Psychiatric:        Mood and Affect: Mood normal.  Behavior: Behavior normal.        Thought Content: Thought content normal.        Judgment: Judgment normal.     No results found for any visits on 03/12/22.      Assessment & Plan:   Problem List Items Addressed This Visit       Respiratory   Bronchitis   Relevant Medications   albuterol (VENTOLIN HFA) 108 (90 Base) MCG/ACT inhaler   ipratropium (ATROVENT) 0.06 % nasal spray   promethazine-dextromethorphan (PROMETHAZINE-DM) 6.25-15 MG/5ML syrup   methylPREDNISolone (MEDROL DOSEPAK) 4 MG TBPK tablet   Other Relevant Orders   DG Chest 2 View    Meds ordered this encounter  Medications   albuterol (VENTOLIN HFA) 108 (90 Base) MCG/ACT inhaler    Sig: Inhale 2 puffs into the lungs every 6 (six) hours as needed for wheezing.    Dispense:  2 each    Refill:  5    Please use generic pro-air    ipratropium (ATROVENT) 0.06 % nasal spray    Sig: Place 2 sprays into both nostrils 4 (four) times daily.    Dispense:  15 mL    Refill:  3   promethazine-dextromethorphan (PROMETHAZINE-DM) 6.25-15 MG/5ML syrup    Sig: Take 5 mLs by mouth at bedtime as needed for cough.    Dispense:  118 mL    Refill:  0   methylPREDNISolone (MEDROL DOSEPAK) 4 MG TBPK tablet    Sig: 6-day pack as directed    Dispense:  21 tablet    Refill:  0   Per chart review, pt has had recurrent sinobronchitis type symptoms for this past year.  Explained to her with flu, symptoms may persists after Tamiflu completion. She does have oxygen sat of 95% with abnormal pulmonary exam; will send for CXR. Also review of chart shows pt was to go for CXR back in June 2023 that she never went for. Will re-order today. Given steroid 6 day dose pack along with refilled albuterol inhaler, atrovent nasal spray and promethazine cough syrup to use prn at bedtime. May continue tessalon perles to use during the day, 200mg  BID prn.  Will follow up on CXR to rule out secondary pneumonia? Return if symptoms worsen or fail to improve.  , MD  CXR results returned before end of day and reviewed. Positive for infiltrates, consistent with pneumonia. Will add antibiotic to regimen.

## 2022-03-12 NOTE — Addendum Note (Signed)
Addended by: Suzan Slick on: 03/12/2022 04:42 PM   Modules accepted: Orders

## 2022-03-27 LAB — LIPID PANEL
Cholesterol: 399 — AB (ref 0–200)
HDL: 26 — AB (ref 35–70)
LDL Cholesterol: 0
LDl/HDL Ratio: 0
Triglycerides: 999 — AB (ref 40–160)

## 2022-04-05 ENCOUNTER — Telehealth: Payer: Self-pay | Admitting: *Deleted

## 2022-04-05 NOTE — Telephone Encounter (Signed)
Pt called and LVM stating that her labs from August showed that her cholesterol was elevated and because of this wanted to know about needing to be on a medication for this.   Can send a my chart or call pt about this.

## 2022-04-08 NOTE — Telephone Encounter (Signed)
Reviewed lab results it was your Triglycerides that were the most concerning at over 3000. This can cause things like pancreatitis to be an issue. I do think we need to start medication. Can we do a in person or virtual visit to discuss medications.

## 2022-04-09 NOTE — Telephone Encounter (Signed)
Called patient back. LVM letting her know information, she has virtual visit scheduled for tomorrow to discuss. To call back if needs [place] prior to that.

## 2022-04-10 ENCOUNTER — Telehealth (INDEPENDENT_AMBULATORY_CARE_PROVIDER_SITE_OTHER): Payer: Medicaid Other | Admitting: Physician Assistant

## 2022-04-10 DIAGNOSIS — D509 Iron deficiency anemia, unspecified: Secondary | ICD-10-CM | POA: Diagnosis not present

## 2022-04-10 DIAGNOSIS — E781 Pure hyperglyceridemia: Secondary | ICD-10-CM

## 2022-04-10 DIAGNOSIS — E559 Vitamin D deficiency, unspecified: Secondary | ICD-10-CM | POA: Diagnosis not present

## 2022-04-10 DIAGNOSIS — E538 Deficiency of other specified B group vitamins: Secondary | ICD-10-CM

## 2022-04-10 MED ORDER — VITAMIN B-12 1000 MCG PO TABS: 1000.0000 ug | ORAL_TABLET | Freq: Every day | ORAL | 3 refills | Status: DC

## 2022-04-10 MED ORDER — ATORVASTATIN CALCIUM 40 MG PO TABS: 40.0000 mg | ORAL_TABLET | Freq: Every day | ORAL | 3 refills | Status: DC

## 2022-04-10 NOTE — Patient Instructions (Signed)
Recheck in 4 months

## 2022-04-10 NOTE — Progress Notes (Signed)
..Virtual Visit via Telephone Note  I connected with Melissa Sharp on 04/10/22 at 11:30 AM EST by telephone and verified that I am speaking with the correct person using two identifiers.  Location: Patient: home Provider: clinic  .Marland KitchenParticipating in visit:  Patient: Melissa Sharp Provider: Iran Planas PA-C Provider in training: Cari Caraway PA-S    I discussed the limitations, risks, security and privacy concerns of performing an evaluation and management service by telephone and the availability of in person appointments. I also discussed with the patient that there may be a patient responsible charge related to this service. The patient expressed understanding and agreed to proceed.   History of Present Illness: Pt is a 42 yo female who calls in to discuss elevated TG levels at OB/GYN office. Reviewed report and over 3000. She has family history of high TG and personal hx. She has not been on medication. She is about 6 months post partum but she is not breastfeeding. She is on iron supplement and pre-natal vitamins and b12 supplement.    .. Active Ambulatory Problems    Diagnosis Date Noted   IBS (irritable bowel syndrome) 10/27/2013   Migraine without aura, without mention of intractable migraine without mention of status migrainosus 10/29/2013   Esophageal reflux 10/29/2013   Depression, major, recurrent (HCC) 10/29/2013   Mixed anxiety depressive disorder 10/29/2013   B12 deficiency 10/29/2013   Hypertriglyceridemia 10/29/2013   Elevated liver enzymes 11/02/2013   Poor venous access 01/24/2015   Post concussive syndrome 05/21/2015   Cervical pain (neck) 06/26/2015   Intractable migraine 06/30/2015   Migraine with status migrainosus 07/15/2015   Diastasis recti 02/20/2016   Low back pain with radiation, unspecified laterality 02/23/2016   Overweight (BMI 25.0-29.9) 03/20/2016   Elevated blood pressure reading 05/03/2016   Tachycardia with heart rate 100-120 beats per minute  02/25/2017   Tapeworm infection 02/25/2017   Non-seasonal allergic rhinitis 05/08/2017   Anxiety 05/08/2017   Functional diarrhea 07/01/2017   Right upper quadrant guarding 07/01/2017   Bloating 07/01/2017   Nausea 07/01/2017   Cervical radiculopathy 03/15/2018   Muscle weakness of left upper extremity 03/15/2018   Hypertension goal BP (blood pressure) < 130/80 03/15/2018   Influenza-like illness 05/22/2018   Annular skin lesion 06/25/2018   Tinea corporis 11/19/2019   Recurrent isolated sleep paralysis 12/23/2019   Staring episodes 12/23/2019   Chronic fatigue 12/23/2019   Acute abdominal pain 03/22/2020   Attention deficit hyperactivity disorder (ADHD), combined type 05/30/2020   Female fertility problems 05/30/2020   Bilateral calf pain 10/13/2020   Body aches 10/13/2020   Weakness 10/13/2020   Pneumonia due to COVID-19 virus 10/25/2020   Elevated brain natriuretic peptide (BNP) level 10/25/2020   Low hemoglobin 10/25/2020   Nonintractable headache 10/25/2020   Palpitations 10/25/2020   Acute recurrent pansinusitis 01/25/2021   Primary hypertension 01/25/2021   Chronic bilateral low back pain without sciatica 01/25/2021   Bronchitis 08/15/2021   Right foot pain 08/15/2021   Chronic left-sided low back pain with left-sided sciatica 10/05/2021   Hypothyroidism 10/06/2021   Inflammation of right ear canal 12/17/2021   Vitamin D deficiency 04/10/2022   Iron deficiency anemia 04/10/2022   Resolved Ambulatory Problems    Diagnosis Date Noted   Thyroid activity decreased 10/27/2013   Pregnancy 01/24/2015   Viral gastroenteritis 01/24/2015   Nausea and vomiting during pregnancy 03/07/2015   Dizziness 03/07/2015   Dehydration 03/07/2015   Musculoskeletal neck pain 06/26/2015   PIH (pregnancy induced hypertension) 07/28/2015   Chalazion  left upper eyelid 08/26/2018   Sinobronchitis 09/24/2019   SOB (shortness of breath) on exertion 10/13/2020   Cough 10/13/2020   SOB  (shortness of breath) 10/25/2020   Past Medical History:  Diagnosis Date   Depression    Migraine    Preeclampsia        Observations/Objective: No acute distress Normal mood Normal breathing   Assessment and Plan: Marland KitchenMarland KitchenAbi was seen today for levels high.  Diagnoses and all orders for this visit:  Hypertriglyceridemia -     atorvastatin (LIPITOR) 40 MG tablet; Take 1 tablet (40 mg total) by mouth daily.  Vitamin D deficiency  B12 deficiency  Iron deficiency anemia, unspecified iron deficiency anemia type   Discussed high TG and risk factors of elevated TG Discussed diet changes and encouraged Mediterranean diet with at least 150 minutes of exercise a week Start lipitor, pt is not breastfeeding. Discussed side effects Recheck in 4 months lipid level Continue on prescription vit D and OTC b12 and iron.     Follow Up Instructions:    I discussed the assessment and treatment plan with the patient. The patient was provided an opportunity to ask questions and all were answered. The patient agreed with the plan and demonstrated an understanding of the instructions.   The patient was advised to call back or seek an in-person evaluation if the symptoms worsen or if the condition fails to improve as anticipated.  I provided 15 minutes of non-face-to-face time during this encounter.   Iran Planas, PA-C

## 2022-04-12 ENCOUNTER — Encounter: Payer: Self-pay | Admitting: Physician Assistant

## 2022-04-12 ENCOUNTER — Telehealth: Payer: Self-pay | Admitting: Physician Assistant

## 2022-04-12 DIAGNOSIS — I10 Essential (primary) hypertension: Secondary | ICD-10-CM

## 2022-04-12 DIAGNOSIS — D509 Iron deficiency anemia, unspecified: Secondary | ICD-10-CM

## 2022-04-12 DIAGNOSIS — E039 Hypothyroidism, unspecified: Secondary | ICD-10-CM

## 2022-04-12 DIAGNOSIS — E781 Pure hyperglyceridemia: Secondary | ICD-10-CM

## 2022-04-12 NOTE — Telephone Encounter (Signed)
Labs ordered.

## 2022-04-12 NOTE — Telephone Encounter (Signed)
She had labs in August. Which labs would she need?

## 2022-04-12 NOTE — Telephone Encounter (Signed)
Lipid/cmp/TsH

## 2022-04-12 NOTE — Telephone Encounter (Signed)
Patient is scheduled for Aug 07, 2022 she is requesting labs be done the week before

## 2022-04-15 ENCOUNTER — Encounter: Payer: Self-pay | Admitting: Physician Assistant

## 2022-04-16 ENCOUNTER — Telehealth: Payer: Self-pay | Admitting: Neurology

## 2022-04-16 MED ORDER — FENOFIBRATE 145 MG PO TABS: 145.0000 mg | ORAL_TABLET | Freq: Every day | ORAL | 3 refills | Status: DC

## 2022-04-16 NOTE — Telephone Encounter (Signed)
Patient Melissa Sharp stating she started Statin medication (Atorvastatin 40 mg) and has developed muscle pain. She is asking if she can try something different?

## 2022-04-16 NOTE — Telephone Encounter (Signed)
Sent tricor.

## 2022-04-17 MED ORDER — CYCLOBENZAPRINE HCL 10 MG PO TABS: ORAL_TABLET | ORAL | 0 refills | Status: DC

## 2022-04-17 NOTE — Telephone Encounter (Signed)
Patient called back and LVM asking for a muscle relaxer to help with the muscle aches until the medication is out of her system. Please advise.

## 2022-04-17 NOTE — Addendum Note (Signed)
Addended by: Donella Stade on: 04/17/2022 12:11 PM   Modules accepted: Orders

## 2022-04-17 NOTE — Telephone Encounter (Signed)
Patient made aware of recommendations and that RX sent.

## 2022-07-09 ENCOUNTER — Other Ambulatory Visit: Payer: Self-pay | Admitting: Physician Assistant

## 2022-07-09 DIAGNOSIS — I10 Essential (primary) hypertension: Secondary | ICD-10-CM

## 2022-07-29 ENCOUNTER — Other Ambulatory Visit: Payer: Medicare Other

## 2022-08-02 ENCOUNTER — Telehealth: Payer: Self-pay

## 2022-08-02 MED ORDER — RIZATRIPTAN BENZOATE 10 MG PO TBDP: 10.0000 mg | ORAL_TABLET | ORAL | 0 refills | Status: DC | PRN

## 2022-08-02 NOTE — Telephone Encounter (Signed)
Patient advised.

## 2022-08-02 NOTE — Telephone Encounter (Signed)
Melissa Sharp has a migraine and has tried ibuprofen, tylenol and aspirin without relief. She states she has had Imitrex in the past and Topamax. She hasn't had a migraine in years. She has had the migraine for about 24 hours. She would like some medication called in.

## 2022-08-05 ENCOUNTER — Other Ambulatory Visit: Payer: Medicare Other

## 2022-08-06 LAB — COMPLETE METABOLIC PANEL WITH GFR
AG Ratio: 2.2 (calc) (ref 1.0–2.5)
ALT: 31 U/L — ABNORMAL HIGH (ref 6–29)
AST: 33 U/L — ABNORMAL HIGH (ref 10–30)
Albumin: 5.2 g/dL — ABNORMAL HIGH (ref 3.6–5.1)
Alkaline phosphatase (APISO): 22 U/L — ABNORMAL LOW (ref 31–125)
BUN/Creatinine Ratio: 13 (calc) (ref 6–22)
BUN: 19 mg/dL (ref 7–25)
CO2: 23 mmol/L (ref 20–32)
Calcium: 10.5 mg/dL — ABNORMAL HIGH (ref 8.6–10.2)
Chloride: 100 mmol/L (ref 98–110)
Creat: 1.42 mg/dL — ABNORMAL HIGH (ref 0.50–0.99)
Globulin: 2.4 g/dL (calc) (ref 1.9–3.7)
Glucose, Bld: 88 mg/dL (ref 65–99)
Potassium: 4.2 mmol/L (ref 3.5–5.3)
Sodium: 137 mmol/L (ref 135–146)
Total Bilirubin: 0.4 mg/dL (ref 0.2–1.2)
Total Protein: 7.6 g/dL (ref 6.1–8.1)
eGFR: 47 mL/min/{1.73_m2} — ABNORMAL LOW (ref >=60)

## 2022-08-06 LAB — LIPID PANEL W/REFLEX DIRECT LDL
Cholesterol: 247 mg/dL — ABNORMAL HIGH (ref <200)
HDL: 60 mg/dL (ref >=50)
Non-HDL Cholesterol (Calc): 187 mg/dL (calc) — ABNORMAL HIGH (ref <130)
Total CHOL/HDL Ratio: 4.1 (calc) (ref <5.0)
Triglycerides: 467 mg/dL — ABNORMAL HIGH (ref <150)

## 2022-08-06 LAB — TSH: TSH: 3.18 mIU/L

## 2022-08-06 LAB — DIRECT LDL: Direct LDL: 136 mg/dL — ABNORMAL HIGH (ref <100)

## 2022-08-07 ENCOUNTER — Ambulatory Visit: Payer: Medicare Other | Admitting: Physician Assistant

## 2022-08-07 ENCOUNTER — Encounter: Payer: Self-pay | Admitting: Physician Assistant

## 2022-08-07 VITALS — BP 117/73 | HR 79 | Ht 63.0 in | Wt 140.0 lb

## 2022-08-07 DIAGNOSIS — E039 Hypothyroidism, unspecified: Secondary | ICD-10-CM | POA: Diagnosis not present

## 2022-08-07 DIAGNOSIS — I10 Essential (primary) hypertension: Secondary | ICD-10-CM

## 2022-08-07 DIAGNOSIS — E781 Pure hyperglyceridemia: Secondary | ICD-10-CM

## 2022-08-07 DIAGNOSIS — R140 Abdominal distension (gaseous): Secondary | ICD-10-CM

## 2022-08-07 DIAGNOSIS — M6208 Separation of muscle (nontraumatic), other site: Secondary | ICD-10-CM | POA: Diagnosis not present

## 2022-08-07 DIAGNOSIS — G43709 Chronic migraine without aura, not intractable, without status migrainosus: Secondary | ICD-10-CM

## 2022-08-07 DIAGNOSIS — R748 Abnormal levels of other serum enzymes: Secondary | ICD-10-CM

## 2022-08-07 MED ORDER — LEVOTHYROXINE SODIUM 75 MCG PO TABS: 75.0000 ug | ORAL_TABLET | Freq: Every day | ORAL | 0 refills | Status: DC

## 2022-08-07 MED ORDER — ATORVASTATIN CALCIUM 40 MG PO TABS: 40.0000 mg | ORAL_TABLET | Freq: Every day | ORAL | 3 refills | Status: DC

## 2022-08-07 MED ORDER — NURTEC 75 MG PO TBDP: ORAL_TABLET | ORAL | 2 refills | Status: DC

## 2022-08-07 NOTE — Progress Notes (Signed)
Discussed with patient in office at visit. Agreed to start lipitor. Increased synthroid. Recheck labs in 3 months.

## 2022-08-07 NOTE — Patient Instructions (Addendum)
Start nurtec every other day Start lipitor every night Increased synthroid in the morning    Rimegepant Disintegrating Tablets What is this medication? RIMEGEPANT (ri ME je pant) prevents and treats migraines. It works by blocking a substance in the body that causes migraines. This medicine may be used for other purposes; ask your health care provider or pharmacist if you have questions. COMMON BRAND NAME(S): NURTEC ODT What should I tell my care team before I take this medication? They need to know if you have any of these conditions: Kidney disease Liver disease An unusual or allergic reaction to rimegepant, other medications, foods, dyes, or preservatives Pregnant or trying to get pregnant Breast-feeding How should I use this medication? Take this medication by mouth. Take it as directed on the prescription label. Leave the tablet in the sealed pack until you are ready to take it. With dry hands, open the pack and gently remove the tablet. If the tablet breaks or crumbles, throw it away. Use a new tablet. Place the tablet in the mouth and allow it to dissolve. Then, swallow it. Do not cut, crush, or chew this medication. You do not need water to take this medication. Talk to your care team about the use of this medication in children. Special care may be needed. Overdosage: If you think you have taken too much of this medicine contact a poison control center or emergency room at once. NOTE: This medicine is only for you. Do not share this medicine with others. What if I miss a dose? This does not apply. This medication is not for regular use. What may interact with this medication? Certain medications for fungal infections, such as fluconazole, itraconazole Rifampin This list may not describe all possible interactions. Give your health care provider a list of all the medicines, herbs, non-prescription drugs, or dietary supplements you use. Also tell them if you smoke, drink  alcohol, or use illegal drugs. Some items may interact with your medicine. What should I watch for while using this medication? Visit your care team for regular checks on your progress. Tell your care team if your symptoms do not start to get better or if they get worse. What side effects may I notice from receiving this medication? Side effects that you should report to your care team as soon as possible: Allergic reactions--skin rash, itching, hives, swelling of the face, lips, tongue, or throat Side effects that usually do not require medical attention (report to your care team if they continue or are bothersome): Nausea Stomach pain This list may not describe all possible side effects. Call your doctor for medical advice about side effects. You may report side effects to FDA at 1-800-FDA-1088. Where should I keep my medication? Keep out of the reach of children and pets. Store at room temperature between 20 and 25 degrees C (68 and 77 degrees F). Get rid of any unused medication after the expiration date. To get rid of medications that are no longer needed or have expired: Take the medication to a medication take-back program. Check with your pharmacy or law enforcement to find a location. If you cannot return the medication, check the label or package insert to see if the medication should be thrown out in the garbage or flushed down the toilet. If you are not sure, ask your care team. If it is safe to put it in the trash, take the medication out of the container. Mix the medication with cat litter, dirt, coffee grounds, or  other unwanted substance. Seal the mixture in a bag or container. Put it in the trash. NOTE: This sheet is a summary. It may not cover all possible information. If you have questions about this medicine, talk to your doctor, pharmacist, or health care provider.  2023 Elsevier/Gold Standard (2021-04-25 00:00:00)

## 2022-08-07 NOTE — Progress Notes (Signed)
Established Patient Office Visit  Subjective   Patient ID: Melissa Sharp, female    DOB: 18-Jul-1980  Age: 42 y.o. MRN: [id number]  Chief Complaint  Patient presents with   Follow-up    HPI Pt is a 42 yo female who presents to the clinic to go over labs.   She is not drinking any alcohol. She walks daily multiple miles a day. She has lost 17lbs since having her last child. She continues to have large, bloated abdomen. She is frustated with this problem. She is also having more migraines. She needs something for prevention.  .. Active Ambulatory Problems    Diagnosis Date Noted   IBS (irritable bowel syndrome) 10/27/2013   Migraine without aura, without mention of intractable migraine without mention of status migrainosus 10/29/2013   Esophageal reflux 10/29/2013   Depression, major, recurrent (HCC) 10/29/2013   Mixed anxiety depressive disorder 10/29/2013   B12 deficiency 10/29/2013   Hypertriglyceridemia 10/29/2013   Elevated liver enzymes 11/02/2013   Poor venous access 01/24/2015   Post concussive syndrome 05/21/2015   Cervical pain (neck) 06/26/2015   Intractable migraine 06/30/2015   Migraine with status migrainosus 07/15/2015   Diastasis recti 02/20/2016   Low back pain with radiation, unspecified laterality 02/23/2016   Overweight (BMI 25.0-29.9) 03/20/2016   Elevated blood pressure reading 05/03/2016   Tachycardia with heart rate 100-120 beats per minute 02/25/2017   Tapeworm infection 02/25/2017   Non-seasonal allergic rhinitis 05/08/2017   Anxiety 05/08/2017   Functional diarrhea 07/01/2017   Right upper quadrant guarding 07/01/2017   Bloating 07/01/2017   Nausea 07/01/2017   Cervical radiculopathy 03/15/2018   Muscle weakness of left upper extremity 03/15/2018   Hypertension goal BP (blood pressure) < 130/80 03/15/2018   Influenza-like illness 05/22/2018   Annular skin lesion 06/25/2018   Tinea corporis 11/19/2019   Recurrent isolated sleep paralysis  12/23/2019   Staring episodes 12/23/2019   Chronic fatigue 12/23/2019   Acute abdominal pain 03/22/2020   Attention deficit hyperactivity disorder (ADHD), combined type 05/30/2020   Female fertility problems 05/30/2020   Bilateral calf pain 10/13/2020   Body aches 10/13/2020   Weakness 10/13/2020   Pneumonia due to COVID-19 virus 10/25/2020   Elevated brain natriuretic peptide (BNP) level 10/25/2020   Low hemoglobin 10/25/2020   Nonintractable headache 10/25/2020   Palpitations 10/25/2020   Acute recurrent pansinusitis 01/25/2021   Primary hypertension 01/25/2021   Chronic bilateral low back pain without sciatica 01/25/2021   Bronchitis 08/15/2021   Right foot pain 08/15/2021   Chronic left-sided low back pain with left-sided sciatica 10/05/2021   Hypothyroidism 10/06/2021   Inflammation of right ear canal 12/17/2021   Vitamin D deficiency 04/10/2022   Iron deficiency anemia 04/10/2022   Resolved Ambulatory Problems    Diagnosis Date Noted   Thyroid activity decreased 10/27/2013   Pregnancy 01/24/2015   Viral gastroenteritis 01/24/2015   Nausea and vomiting during pregnancy 03/07/2015   Dizziness 03/07/2015   Dehydration 03/07/2015   Musculoskeletal neck pain 06/26/2015   PIH (pregnancy induced hypertension) 07/28/2015   Chalazion left upper eyelid 08/26/2018   Sinobronchitis 09/24/2019   SOB (shortness of breath) on exertion 10/13/2020   Cough 10/13/2020   SOB (shortness of breath) 10/25/2020   Past Medical History:  Diagnosis Date   Depression    Migraine    Preeclampsia      ROS See HPI.    Objective:     BP 117/73   Pulse 79   Ht 5\' 3"  (1.6 m)  Wt 140 lb (63.5 kg)   SpO2 99%   BMI 24.80 kg/m  BP Readings from Last 3 Encounters:  08/07/22 117/73  03/12/22 96/62  01/30/22 126/70   Wt Readings from Last 3 Encounters:  08/07/22 140 lb (63.5 kg)  03/12/22 157 lb 4 oz (71.3 kg)  03/06/22 163 lb (73.9 kg)      Physical Exam Constitutional:       Appearance: Normal appearance.  HENT:     Head: Normocephalic.  Cardiovascular:     Rate and Rhythm: Normal rate and regular rhythm.  Pulmonary:     Effort: Pulmonary effort is normal.  Abdominal:     General: There is no distension.     Palpations: There is no mass.     Tenderness: There is no abdominal tenderness. There is no right CVA tenderness, left CVA tenderness, guarding or rebound.     Hernia: No hernia is present.     Comments: Bloated abodomen.   Musculoskeletal:     Right lower leg: No edema.     Left lower leg: No edema.  Neurological:     General: No focal deficit present.     Mental Status: She is alert and oriented to person, place, and time.  Psychiatric:        Mood and Affect: Mood normal.      The 10-year ASCVD risk score (Arnett DK, et al., 2019) is: 0.9%    Assessment & Plan:  Marland KitchenMarland KitchenAdriannah was seen today for follow-up.  Diagnoses and all orders for this visit:  Diastasis recti  Hypertriglyceridemia -     atorvastatin (LIPITOR) 40 MG tablet; Take 1 tablet (40 mg total) by mouth daily.  Hypothyroidism, unspecified type -     levothyroxine (SYNTHROID) 75 MCG tablet; Take 1 tablet (75 mcg total) by mouth daily before breakfast.  Primary hypertension  Elevated liver enzymes -     US Abdomen Complete; Future  Bloating -     US Abdomen Complete; Future  Chronic migraine without aura without status migrainosus, not intractable -     Rimegepant Sulfate (NURTEC) 75 MG TBDP; Take one tablet every other day.  Increased synthroid to see if can get TSH in optimal 1-2 range.  Recheck in 6 weeks.   BP looks great.   U/s of abdomen ordered for elevated liver enyzmes  Nurtec every other day started for migraine prevention  Lipitor to start for high TG and LdL  Follow up with gen surgery for recti repair    Return in about 3 months (around 11/07/2022).    Tandy Gaw, PA-C

## 2022-08-14 ENCOUNTER — Telehealth: Payer: Self-pay | Admitting: Physician Assistant

## 2022-08-14 DIAGNOSIS — G43709 Chronic migraine without aura, not intractable, without status migrainosus: Secondary | ICD-10-CM

## 2022-08-14 MED ORDER — NURTEC 75 MG PO TBDP: 1.0000 | ORAL_TABLET | Freq: Every day | ORAL | 4 refills | Status: DC | PRN

## 2022-08-14 NOTE — Telephone Encounter (Signed)
Spoke w/nick at the pharmacy. The insurance will only pay for 8 tablets every 30 days.   Rx resent to reflect this.

## 2022-08-14 NOTE — Telephone Encounter (Signed)
Pt called.  She is following up on rx for Romegepant. Pharmacy: [facility]

## 2022-08-16 ENCOUNTER — Telehealth: Payer: Self-pay | Admitting: Physician Assistant

## 2022-08-16 NOTE — Telephone Encounter (Addendum)
Patient called about Hinton Rao she say her and Lesly Rubenstein discussed trying this medication instead of Nurtec 75mg  please contact patient    Patient is following up on [place] She needs PA on this

## 2022-08-19 MED ORDER — QULIPTA 10 MG PO TABS: ORAL_TABLET | ORAL | 1 refills | Status: DC

## 2022-08-19 NOTE — Addendum Note (Signed)
Addended by: Jomarie Longs on: 08/19/2022 02:25 PM   Modules accepted: Orders

## 2022-08-22 ENCOUNTER — Other Ambulatory Visit: Payer: Medicare Other

## 2022-08-23 NOTE — Telephone Encounter (Signed)
Errounous error ?

## 2022-08-26 ENCOUNTER — Telehealth: Payer: Self-pay | Admitting: Physician Assistant

## 2022-08-26 NOTE — Telephone Encounter (Signed)
Pt requests to check the status of PA for Atogepant (QULIPTA) 10 MG TABS. Please advise.  [phone]. Please call patient back regarding status. Patient has been having internet trouble.

## 2022-08-28 ENCOUNTER — Ambulatory Visit (INDEPENDENT_AMBULATORY_CARE_PROVIDER_SITE_OTHER): Payer: Medicare Other

## 2022-08-28 DIAGNOSIS — R140 Abdominal distension (gaseous): Secondary | ICD-10-CM | POA: Diagnosis not present

## 2022-08-28 DIAGNOSIS — R748 Abnormal levels of other serum enzymes: Secondary | ICD-10-CM

## 2022-08-28 NOTE — Progress Notes (Signed)
Jay,   No gallstones.  Liver looks good.  Kidneys look good.  No abnormalities.

## 2022-09-04 NOTE — Telephone Encounter (Signed)
Pt called. Following up on PA for Qulipta.

## 2022-09-16 ENCOUNTER — Telehealth: Payer: Self-pay

## 2022-09-16 NOTE — Telephone Encounter (Addendum)
Initiated Prior authorization forQulipta 10MG  tablets  [email or web address] Case/Key:[id number] Status: approved as of 09/16/22 Reason:This drug has been approved under the Member's Medicare Part D benefit. Approved  quantity: 90 units per 90 day(s). You may fill up to a 90 day supply except for those on  Specialty Tier 5, which can be filled up to a 30 day supply. Please call the pharmacy to  process the prescription claim. Notified Pt via: Mychart Med part  d   Pt has [employer]  FA:[id number]

## 2022-09-27 ENCOUNTER — Telehealth: Payer: Self-pay | Admitting: Physician Assistant

## 2022-09-27 NOTE — Telephone Encounter (Signed)
FYI: Pt has an appointment on 7/17 to be seen for stomach pain, diarrhea and constipation. Spoke to [place] in Triage who advised that patient go to the hospital because of the stomach pain.  I called patient she stated that she did not have a stomach pain it was more a discomfort. She took [employer] and she is feeling better.

## 2022-09-27 NOTE — Telephone Encounter (Signed)
Patient informed. 

## 2022-10-02 ENCOUNTER — Telehealth: Payer: Medicare Other | Admitting: Physician Assistant

## 2022-10-15 ENCOUNTER — Telehealth (INDEPENDENT_AMBULATORY_CARE_PROVIDER_SITE_OTHER): Payer: Medicare Other | Admitting: Physician Assistant

## 2022-10-15 ENCOUNTER — Encounter: Payer: Self-pay | Admitting: Physician Assistant

## 2022-10-15 VITALS — Ht 64.0 in | Wt 135.0 lb

## 2022-10-15 DIAGNOSIS — J309 Allergic rhinitis, unspecified: Secondary | ICD-10-CM | POA: Diagnosis not present

## 2022-10-15 DIAGNOSIS — R051 Acute cough: Secondary | ICD-10-CM

## 2022-10-15 DIAGNOSIS — J302 Other seasonal allergic rhinitis: Secondary | ICD-10-CM

## 2022-10-15 MED ORDER — METHYLPREDNISOLONE 4 MG PO TBPK
ORAL_TABLET | ORAL | 0 refills | Status: DC
Start: 2022-10-15 — End: 2022-10-30

## 2022-10-15 MED ORDER — IPRATROPIUM BROMIDE 0.06 % NA SOLN
2.0000 | Freq: Four times a day (QID) | NASAL | 3 refills | Status: AC
Start: 2022-10-15 — End: ?

## 2022-10-15 MED ORDER — MONTELUKAST SODIUM 10 MG PO TABS: 10.0000 mg | ORAL_TABLET | Freq: Every day | ORAL | 3 refills | Status: DC

## 2022-10-15 NOTE — Progress Notes (Signed)
..Virtual Visit via Video Note  I connected with Melissa Sharp on 10/15/22 at 11:30 AM EDT by a video enabled telemedicine application and verified that I am speaking with the correct person using two identifiers.  Location: Patient: home Provider: clinic  .Marland KitchenParticipating in visit:  Patient: Melissa Sharp Provider: Tandy Gaw PA-C   I discussed the limitations of evaluation and management by telemedicine and the availability of in person appointments. The patient expressed understanding and agreed to proceed.  History of Present Illness: Pt is a 42 yo female with seasonal and non-seasonal allergies who calls into the clinic with itchy watery eyes, clear rhinorrhea, sinus pressure, headache, ear popping. Her husband and daughter have similar mild symptoms. She does not like to take anti-histamines because they make her sleepy. She is not taking singulair at bedtime because she needs refills. She is taking tylenol only for symptoms with not much relief. No fever, chills, body aches, nausea, vomiting.     .. Active Ambulatory Problems    Diagnosis Date Noted   IBS (irritable bowel syndrome) 10/27/2013   Migraine without aura, without mention of intractable migraine without mention of status migrainosus 10/29/2013   Esophageal reflux 10/29/2013   Depression, major, recurrent (HCC) 10/29/2013   Mixed anxiety depressive disorder 10/29/2013   B12 deficiency 10/29/2013   Hypertriglyceridemia 10/29/2013   Elevated liver enzymes 11/02/2013   Poor venous access 01/24/2015   Post concussive syndrome 05/21/2015   Cervical pain (neck) 06/26/2015   Intractable migraine 06/30/2015   Migraine with status migrainosus 07/15/2015   Diastasis recti 02/20/2016   Low back pain with radiation, unspecified laterality 02/23/2016   Overweight (BMI 25.0-29.9) 03/20/2016   Elevated blood pressure reading 05/03/2016   Tachycardia with heart rate 100-120 beats per minute 02/25/2017   Tapeworm infection  02/25/2017   Non-seasonal allergic rhinitis 05/08/2017   Anxiety 05/08/2017   Functional diarrhea 07/01/2017   Right upper quadrant guarding 07/01/2017   Bloating 07/01/2017   Nausea 07/01/2017   Cervical radiculopathy 03/15/2018   Muscle weakness of left upper extremity 03/15/2018   Hypertension goal BP (blood pressure) < 130/80 03/15/2018   Influenza-like illness 05/22/2018   Annular skin lesion 06/25/2018   Tinea corporis 11/19/2019   Recurrent isolated sleep paralysis 12/23/2019   Staring episodes 12/23/2019   Chronic fatigue 12/23/2019   Acute abdominal pain 03/22/2020   Attention deficit hyperactivity disorder (ADHD), combined type 05/30/2020   Female fertility problems 05/30/2020   Bilateral calf pain 10/13/2020   Body aches 10/13/2020   Weakness 10/13/2020   Pneumonia due to COVID-19 virus 10/25/2020   Elevated brain natriuretic peptide (BNP) level 10/25/2020   Low hemoglobin 10/25/2020   Nonintractable headache 10/25/2020   Palpitations 10/25/2020   Acute recurrent pansinusitis 01/25/2021   Primary hypertension 01/25/2021   Chronic bilateral low back pain without sciatica 01/25/2021   Bronchitis 08/15/2021   Right foot pain 08/15/2021   Chronic left-sided low back pain with left-sided sciatica 10/05/2021   Hypothyroidism 10/06/2021   Inflammation of right ear canal 12/17/2021   Vitamin D deficiency 04/10/2022   Iron deficiency anemia 04/10/2022   Resolved Ambulatory Problems    Diagnosis Date Noted   Thyroid activity decreased 10/27/2013   Pregnancy 01/24/2015   Viral gastroenteritis 01/24/2015   Nausea and vomiting during pregnancy 03/07/2015   Dizziness 03/07/2015   Dehydration 03/07/2015   Musculoskeletal neck pain 06/26/2015   PIH (pregnancy induced hypertension) 07/28/2015   Chalazion left upper eyelid 08/26/2018   Sinobronchitis 09/24/2019   SOB (shortness of breath)  on exertion 10/13/2020   Cough 10/13/2020   SOB (shortness of breath) 10/25/2020    Past Medical History:  Diagnosis Date   Depression    Migraine    Preeclampsia     Observations/Objective: No acute distress  Normal breathing  Dry cough Puffy watery eyes Pale appearance  .Marland Kitchen Today's Vitals   10/15/22 1130  Weight: 135 lb (61.2 kg)  Height: 5\' 4"  (1.626 m)   Body mass index is 23.17 kg/m.    Assessment and Plan: Marland KitchenMarland KitchenJama was seen today for dry cough runny nose ear pain.  Diagnoses and all orders for this visit:  Allergic sinusitis -     montelukast (SINGULAIR) 10 MG tablet; Take 1 tablet (10 mg total) by mouth daily. -     ipratropium (ATROVENT) 0.06 % nasal spray; Place 2 sprays into both nostrils 4 (four) times daily. -     methylPREDNISolone (MEDROL DOSEPAK) 4 MG TBPK tablet; Take as directed by package insert.  Seasonal allergies -     montelukast (SINGULAIR) 10 MG tablet; Take 1 tablet (10 mg total) by mouth daily. -     ipratropium (ATROVENT) 0.06 % nasal spray; Place 2 sprays into both nostrils 4 (four) times daily.  Acute cough -     montelukast (SINGULAIR) 10 MG tablet; Take 1 tablet (10 mg total) by mouth daily. -     ipratropium (ATROVENT) 0.06 % nasal spray; Place 2 sprays into both nostrils 4 (four) times daily. -     methylPREDNISolone (MEDROL DOSEPAK) 4 MG TBPK tablet; Take as directed by package insert.   No signs of bacterial etiology Discussed allergic sinusitis Encouraged nasal sinus rinse and restarting singulair at bedtime Medrol dose pack and atrovent sent to pharmacy Follow up as needed or if symptoms persist or worsen.     Follow Up Instructions:    I discussed the assessment and treatment plan with the patient. The patient was provided an opportunity to ask questions and all were answered. The patient agreed with the plan and demonstrated an understanding of the instructions.   The patient was advised to call back or seek an in-person evaluation if the symptoms worsen or if the condition fails to improve as  anticipated.    Tandy Gaw, PA-C

## 2022-10-15 NOTE — Progress Notes (Signed)
Dry cough runny nose and ear pain bi-lat feverish body chills for 3 days

## 2022-10-30 ENCOUNTER — Other Ambulatory Visit: Payer: Self-pay | Admitting: Physician Assistant

## 2022-10-30 ENCOUNTER — Encounter: Payer: Self-pay | Admitting: Physician Assistant

## 2022-10-30 ENCOUNTER — Ambulatory Visit (INDEPENDENT_AMBULATORY_CARE_PROVIDER_SITE_OTHER): Payer: Medicare Other | Admitting: Physician Assistant

## 2022-10-30 VITALS — BP 108/63 | HR 80 | Ht 63.0 in | Wt 135.0 lb

## 2022-10-30 DIAGNOSIS — Z0000 Encounter for general adult medical examination without abnormal findings: Secondary | ICD-10-CM | POA: Diagnosis not present

## 2022-10-30 DIAGNOSIS — E039 Hypothyroidism, unspecified: Secondary | ICD-10-CM

## 2022-10-30 DIAGNOSIS — R0683 Snoring: Secondary | ICD-10-CM

## 2022-10-30 DIAGNOSIS — R5383 Other fatigue: Secondary | ICD-10-CM

## 2022-10-30 DIAGNOSIS — G478 Other sleep disorders: Secondary | ICD-10-CM

## 2022-10-30 DIAGNOSIS — E781 Pure hyperglyceridemia: Secondary | ICD-10-CM

## 2022-10-30 DIAGNOSIS — Z1231 Encounter for screening mammogram for malignant neoplasm of breast: Secondary | ICD-10-CM

## 2022-10-30 DIAGNOSIS — Z131 Encounter for screening for diabetes mellitus: Secondary | ICD-10-CM

## 2022-10-30 DIAGNOSIS — D509 Iron deficiency anemia, unspecified: Secondary | ICD-10-CM

## 2022-10-30 DIAGNOSIS — E538 Deficiency of other specified B group vitamins: Secondary | ICD-10-CM

## 2022-10-30 DIAGNOSIS — E559 Vitamin D deficiency, unspecified: Secondary | ICD-10-CM

## 2022-10-30 DIAGNOSIS — I10 Essential (primary) hypertension: Secondary | ICD-10-CM

## 2022-10-30 MED ORDER — VITAMIN B-12 1000 MCG PO TABS
1000.0000 ug | ORAL_TABLET | Freq: Every day | ORAL | 3 refills | Status: AC
Start: 2022-10-30 — End: ?

## 2022-10-30 NOTE — Patient Instructions (Signed)

## 2022-10-30 NOTE — Progress Notes (Signed)
Complete physical exam  Patient: Melissa Sharp   DOB: 13-Dec-1980   42 y.o. Female  MRN: [id number]  Subjective:    Chief Complaint  Patient presents with   Annual Exam    Melissa Sharp is a 42 y.o. female who presents today for a complete physical exam. She reports consuming a general diet.  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   She generally feels well. She reports sleeping not so good. She goes to sleep but does not feel rested. She wants to make sure no reason for her fatigue.  She does not have additional problems to discuss today.     Most recent fall risk assessment:    04/10/2022    8:55 AM  Fall Risk   Falls in the past year? 0  Number falls in past yr: 0  Injury with Fall? 0  Risk for fall due to : No Fall Risks  Follow up Falls evaluation completed     Most recent depression screenings:    04/10/2022    8:55 AM 04/10/2022    8:54 AM  PHQ 2/9 Scores  PHQ - 2 Score 0 0    Vision:Within last year and Dental: No current dental problems and Receives regular dental care  Patient Active Problem List   Diagnosis Date Noted   Snoring 10/30/2022   Non-restorative sleep 10/30/2022   No energy 10/30/2022   Vitamin D deficiency 04/10/2022   Iron deficiency anemia 04/10/2022   Inflammation of right ear canal 12/17/2021   Hypothyroidism 10/06/2021   Chronic left-sided low back pain with left-sided sciatica 10/05/2021   Bronchitis 08/15/2021   Right foot pain 08/15/2021   Acute recurrent pansinusitis 01/25/2021   Primary hypertension 01/25/2021   Chronic bilateral low back pain without sciatica 01/25/2021   Pneumonia due to COVID-19 virus 10/25/2020   Elevated brain natriuretic peptide (BNP) level 10/25/2020   Low hemoglobin 10/25/2020   Nonintractable headache 10/25/2020   Palpitations 10/25/2020   Bilateral calf pain 10/13/2020   Body aches 10/13/2020   Weakness 10/13/2020   Attention deficit hyperactivity disorder (ADHD), combined type 05/30/2020   Female  fertility problems 05/30/2020   Acute abdominal pain 03/22/2020   Recurrent isolated sleep paralysis 12/23/2019   Staring episodes 12/23/2019   Chronic fatigue 12/23/2019   Tinea corporis 11/19/2019   Annular skin lesion 06/25/2018   Influenza-like illness 05/22/2018   Cervical radiculopathy 03/15/2018   Muscle weakness of left upper extremity 03/15/2018   Hypertension goal BP (blood pressure) < 130/80 03/15/2018   Functional diarrhea 07/01/2017   Right upper quadrant guarding 07/01/2017   Bloating 07/01/2017   Nausea 07/01/2017   Non-seasonal allergic rhinitis 05/08/2017   Anxiety 05/08/2017   Tachycardia with heart rate 100-120 beats per minute 02/25/2017   Tapeworm infection 02/25/2017   Elevated blood pressure reading 05/03/2016   Overweight (BMI 25.0-29.9) 03/20/2016   Low back pain with radiation, unspecified laterality 02/23/2016   Diastasis recti 02/20/2016   Migraine with status migrainosus 07/15/2015   Intractable migraine 06/30/2015   Cervical pain (neck) 06/26/2015   Post concussive syndrome 05/21/2015   Poor venous access 01/24/2015   Elevated liver enzymes 11/02/2013   Migraine without aura, without mention of intractable migraine without mention of status migrainosus 10/29/2013   Esophageal reflux 10/29/2013   Depression, major, recurrent (HCC) 10/29/2013   Mixed anxiety depressive disorder 10/29/2013   B12 deficiency 10/29/2013   Hypertriglyceridemia 10/29/2013   IBS (irritable bowel syndrome) 10/27/2013   Past Medical History:  Diagnosis Date   Anxiety    Chalazion left upper eyelid 08/26/2018   Depression    Hypothyroidism    Migraine    PIH (pregnancy induced hypertension) 07/28/2015   Preeclampsia    Sinobronchitis 09/24/2019   Thyroid activity decreased 10/27/2013   Past Surgical History:  Procedure Laterality Date   TONSILECTOMY/ADENOIDECTOMY WITH MYRINGOTOMY     Family History  Problem Relation Age of Onset   Atrial fibrillation Mother     Alcohol abuse Father    Pancreatic cancer Father    Hyperlipidemia Other    Migraines Neg Hx    Allergies  Allergen Reactions   Codeine Itching   Labetalol     Numbness and tingling/jerking/aphasia   Latex Hives and Itching   Shellfish Allergy Anaphylaxis   Hydrocodone Itching   Nifedipine     Swelling/bloating      Patient Care Team: Nolene Ebbs as PCP - General (Family Medicine) Jomarie Longs, PA-C (Family Medicine)   Outpatient Medications Prior to Visit  Medication Sig   albuterol (VENTOLIN HFA) 108 (90 Base) MCG/ACT inhaler Inhale 2 puffs into the lungs every 6 (six) hours as needed for wheezing.   Atogepant (QULIPTA) 10 MG TABS Take one tablet daily for migraine prevention.   atorvastatin (LIPITOR) 40 MG tablet Take 1 tablet (40 mg total) by mouth daily.   buPROPion (WELLBUTRIN XL) 150 MG 24 hr tablet Take 150 mg by mouth daily.   cariprazine (VRAYLAR) 1.5 MG capsule Take 3 mg by mouth daily.   cyclobenzaprine (FLEXERIL) 10 MG tablet TAKE ONE TABLET BY MOUTH TWICE A DAY AS NEEDED FOR MUSCLE SPASMS   dicyclomine (BENTYL) 10 MG capsule Take 1 capsule (10 mg total) by mouth 2 (two) times daily.   fenofibrate (TRICOR) 145 MG tablet Take 1 tablet (145 mg total) by mouth daily.   hydrOXYzine (VISTARIL) 25 MG capsule Take 25-50 mg by mouth at bedtime.   ipratropium (ATROVENT) 0.06 % nasal spray Place 2 sprays into both nostrils 4 (four) times daily.   levothyroxine (SYNTHROID) 75 MCG tablet Take 1 tablet (75 mcg total) by mouth daily before breakfast.   montelukast (SINGULAIR) 10 MG tablet Take 1 tablet (10 mg total) by mouth daily.   Rimegepant Sulfate (NURTEC) 75 MG TBDP Take 1 tablet (75 mg total) by mouth daily as needed.   rizatriptan (MAXALT-MLT) 10 MG disintegrating tablet Take 1 tablet (10 mg total) by mouth as needed for migraine. May repeat in 2 hours if needed   traZODone (DESYREL) 100 MG tablet Take 100 mg by mouth at bedtime as needed.   [DISCONTINUED]  cyanocobalamin (VITAMIN B12) 1000 MCG tablet Take 1 tablet (1,000 mcg total) by mouth daily.   [DISCONTINUED] hydrochlorothiazide (HYDRODIURIL) 25 MG tablet Take 1 tablet (25 mg total) by mouth daily.   [DISCONTINUED] methylPREDNISolone (MEDROL DOSEPAK) 4 MG TBPK tablet Take as directed by package insert.   No facility-administered medications prior to visit.    Review of Systems  All other systems reviewed and are negative.         Objective:     BP 108/63   Pulse 80   Ht 5\' 3"  (1.6 m)   Wt 135 lb (61.2 kg)   SpO2 99%   BMI 23.91 kg/m  BP Readings from Last 3 Encounters:  10/30/22 108/63  08/07/22 117/73  03/12/22 96/62   Wt Readings from Last 3 Encounters:  10/30/22 135 lb (61.2 kg)  10/15/22 135 lb (61.2 kg)  08/07/22 140  lb (63.5 kg)      Physical Exam     BP 108/63   Pulse 80   Ht 5\' 3"  (1.6 m)   Wt 135 lb (61.2 kg)   SpO2 99%   BMI 23.91 kg/m   General Appearance:    Alert, cooperative, no distress, appears stated age  Head:    Normocephalic, without obvious abnormality, atraumatic  Eyes:    PERRL, conjunctiva/corneas clear, EOM's intact, fundi    benign, both eyes  Ears:    Normal TM's and external ear canals, both ears  Nose:   Nares normal, septum midline, mucosa normal, no drainage    or sinus tenderness  Throat:   Lips, mucosa, and tongue normal; teeth and gums normal  Neck:   Supple, symmetrical, trachea midline, no adenopathy;    thyroid:  no enlargement/tenderness/nodules; no carotid   bruit or JVD  Back:     Symmetric, no curvature, ROM normal, no CVA tenderness  Lungs:     Clear to auscultation bilaterally, respirations unlabored  Chest Wall:    No tenderness or deformity   Heart:    Regular rate and rhythm, S1 and S2 normal, no murmur, rub   or gallop     Abdomen:     Soft, non-tender, bowel sounds active all four quadrants,    no masses, no organomegaly        Extremities:   Extremities normal, atraumatic, no cyanosis or edema   Pulses:   2+ and symmetric all extremities  Skin:   Skin color, texture, turgor normal, no rashes or lesions  Lymph nodes:   Cervical, supraclavicular, and axillary nodes normal  Neurologic:   CNII-XII intact, normal strength, sensation and reflexes    throughout   Assessment & Plan:    Routine Health Maintenance and Physical Exam  Immunization History  Administered Date(s) Administered   HPV Quadrivalent 07/03/2005, 09/11/2005, 01/07/2006   Influenza Inj Mdck Quad Pf 01/16/2018   Influenza,inj,Quad PF,6+ Mos 02/02/2021   Influenza-Unspecified 11/28/2015, 12/08/2016, 12/16/2016   PFIZER(Purple Top)SARS-COV-2 Vaccination 12/06/2019, 12/30/2019   Tdap 10/27/2013    Health Maintenance  Topic Date Due   Medicare Annual Wellness (AWV)  Never done   MAMMOGRAM  Never done   COVID-19 Vaccine (3 - 2023-24 season) 11/15/2022 (Originally 11/16/2021)   Hepatitis C Screening  04/11/2023 (Originally 07/24/1998)   INFLUENZA VACCINE  06/16/2023 (Originally 10/17/2022)   DTaP/Tdap/Td (2 - Td or Tdap) 10/28/2023   PAP SMEAR-Modifier  04/30/2026   HPV VACCINES  Completed   HIV Screening  Completed    Discussed health benefits of physical activity, and encouraged her to engage in regular exercise appropriate for her age and condition. Marland KitchenBelenda Cruise was seen today for annual exam.  Diagnoses and all orders for this visit:  Routine physical examination -     B12 and Folate Panel -     Lipid panel -     CMP14+EGFR -     CBC w/Diff/Platelet -     Fe+TIBC+Fer -     VITAMIN D 25 Hydroxy (Vit-D Deficiency, Fractures) -     TSH  Hypothyroidism, unspecified type  Hypertriglyceridemia -     Lipid panel  Snoring -     Home sleep test; Future  Non-restorative sleep -     Fe+TIBC+Fer -     VITAMIN D 25 Hydroxy (Vit-D Deficiency, Fractures) -     TSH -     Home sleep test; Future  B12 deficiency -  B12 and Folate Panel -     cyanocobalamin (VITAMIN B12) 1000 MCG tablet; Take 1 tablet (1,000  mcg total) by mouth daily.  Vitamin D deficiency -     VITAMIN D 25 Hydroxy (Vit-D Deficiency, Fractures)  Screening for diabetes mellitus -     CMP14+EGFR  No energy -     CBC w/Diff/Platelet  Iron deficiency anemia, unspecified iron deficiency anemia type -     CBC w/Diff/Platelet  Visit for screening mammogram -     MM 3D SCREENING MAMMOGRAM BILATERAL BREAST   ... Discussed 150 minutes of exercise a week.  Encouraged vitamin D 1000 units and Calcium 1300mg  or 4 servings of dairy a day.  PHQ no concerns today, managed by [facility].  Fasting labs ordered today with other labs to evaluate fatigue. Sleep study ordered Pap UTD Needs mammogram, order placed.  Agreed to get flu shot but not in stock today      Return in about 3 months (around 01/30/2023).     Tandy Gaw, PA-C

## 2022-10-31 ENCOUNTER — Telehealth: Payer: Self-pay | Admitting: Physician Assistant

## 2022-10-31 LAB — LIPID PANEL
Chol/HDL Ratio: 2.4 ratio (ref 0.0–4.4)
Cholesterol, Total: 129 mg/dL (ref 100–199)
HDL: 53 mg/dL (ref >39)
LDL Chol Calc (NIH): 45 mg/dL (ref 0–99)
Triglycerides: 190 mg/dL — ABNORMAL HIGH (ref 0–149)
VLDL Cholesterol Cal: 31 mg/dL (ref 5–40)

## 2022-10-31 LAB — CBC WITH DIFFERENTIAL/PLATELET
Basophils Absolute: 0.1 10*3/uL (ref 0.0–0.2)
Basos: 1 %
EOS (ABSOLUTE): 0.1 10*3/uL (ref 0.0–0.4)
Eos: 2 %
Hematocrit: 39.4 % (ref 34.0–46.6)
Hemoglobin: 12.9 g/dL (ref 11.1–15.9)
Immature Grans (Abs): 0 10*3/uL (ref 0.0–0.1)
Immature Granulocytes: 0 %
Lymphocytes Absolute: 1.5 10*3/uL (ref 0.7–3.1)
Lymphs: 28 %
MCH: 28.2 pg (ref 26.6–33.0)
MCHC: 32.7 g/dL (ref 31.5–35.7)
MCV: 86 fL (ref 79–97)
Monocytes Absolute: 0.3 10*3/uL (ref 0.1–0.9)
Monocytes: 6 %
Neutrophils Absolute: 3.3 10*3/uL (ref 1.4–7.0)
Neutrophils: 63 %
Platelets: 378 10*3/uL (ref 150–450)
RBC: 4.57 x10E6/uL (ref 3.77–5.28)
RDW: 12 % (ref 11.7–15.4)
WBC: 5.3 10*3/uL (ref 3.4–10.8)

## 2022-10-31 LAB — CMP14+EGFR
ALT: 17 IU/L (ref 0–32)
AST: 15 IU/L (ref 0–40)
Albumin: 5 g/dL — ABNORMAL HIGH (ref 3.9–4.9)
Alkaline Phosphatase: 29 IU/L — ABNORMAL LOW (ref 44–121)
BUN/Creatinine Ratio: 9 (ref 9–23)
BUN: 11 mg/dL (ref 6–24)
Bilirubin Total: 0.3 mg/dL (ref 0.0–1.2)
CO2: 20 mmol/L (ref 20–29)
Calcium: 10.4 mg/dL — ABNORMAL HIGH (ref 8.7–10.2)
Chloride: 100 mmol/L (ref 96–106)
Creatinine, Ser: 1.21 mg/dL — ABNORMAL HIGH (ref 0.57–1.00)
Globulin, Total: 2.1 g/dL (ref 1.5–4.5)
Glucose: 95 mg/dL (ref 70–99)
Potassium: 4 mmol/L (ref 3.5–5.2)
Sodium: 137 mmol/L (ref 134–144)
Total Protein: 7.1 g/dL (ref 6.0–8.5)
eGFR: 57 mL/min/{1.73_m2} — ABNORMAL LOW (ref >59)

## 2022-10-31 LAB — IRON,TIBC AND FERRITIN PANEL
Ferritin: 335 ng/mL — ABNORMAL HIGH (ref 15–150)
Iron Saturation: 22 % (ref 15–55)
Iron: 112 ug/dL (ref 27–159)
Total Iron Binding Capacity: 514 ug/dL — ABNORMAL HIGH (ref 250–450)
UIBC: 402 ug/dL (ref 131–425)

## 2022-10-31 LAB — VITAMIN D 25 HYDROXY (VIT D DEFICIENCY, FRACTURES): Vit D, 25-Hydroxy: 70.1 ng/mL (ref 30.0–100.0)

## 2022-10-31 LAB — B12 AND FOLATE PANEL
Folate: 10.6 ng/mL (ref >3.0)
Vitamin B-12: 371 pg/mL (ref 232–1245)

## 2022-10-31 LAB — TSH: TSH: 1.47 u[IU]/mL (ref 0.450–4.500)

## 2022-10-31 NOTE — Progress Notes (Signed)
Melissa Sharp,   LDL looks great.  HDL looks fabulous.  TG just a little elevated but MUCH better than last check.  Kidney function improved. Avoid anti-inflammatories.  Liver function looks better.  Protein just a little high. Are you taking protein supplements?  Thyroid looks good.  Vitamin D looks great!  Normal hemoglobin. Serum iron is good.  Iron stores is elevated but could be due to inflammation. Are you taking iron if so how much right now?  B12 low normal. I would continue to supplement daily.

## 2022-10-31 NOTE — Telephone Encounter (Signed)
Patient returned your call regarding her test results

## 2022-11-01 ENCOUNTER — Encounter: Payer: Self-pay | Admitting: Physician Assistant

## 2022-11-01 NOTE — Telephone Encounter (Signed)
Patient spoke with Cherylann Parr.

## 2022-11-05 NOTE — Progress Notes (Signed)
Recheck ferritin and CMP in 4-6 weeks.

## 2022-11-08 ENCOUNTER — Other Ambulatory Visit: Payer: Self-pay

## 2022-11-08 DIAGNOSIS — N289 Disorder of kidney and ureter, unspecified: Secondary | ICD-10-CM

## 2022-11-08 DIAGNOSIS — D509 Iron deficiency anemia, unspecified: Secondary | ICD-10-CM

## 2022-11-08 NOTE — Progress Notes (Signed)
Ordered labs per result notes.  °

## 2022-11-11 ENCOUNTER — Other Ambulatory Visit: Payer: Self-pay | Admitting: Physician Assistant

## 2022-11-11 DIAGNOSIS — E039 Hypothyroidism, unspecified: Secondary | ICD-10-CM

## 2022-11-13 ENCOUNTER — Telehealth: Payer: Self-pay | Admitting: Physician Assistant

## 2022-11-13 ENCOUNTER — Ambulatory Visit: Payer: Medicare Other | Admitting: Physician Assistant

## 2022-11-13 NOTE — Telephone Encounter (Signed)
Patient called had blood work done she is requesting a vitamin transfusion she is struggling at this point please advise

## 2022-11-14 ENCOUNTER — Telehealth: Payer: Self-pay

## 2022-11-14 NOTE — Telephone Encounter (Signed)
Patient scheduled for B12 injection tomorrow 11/15/22- nurse visit. =kph

## 2022-11-14 NOTE — Telephone Encounter (Signed)
Patient is requesting a refill request for ferrous sulfate. Rx not listed in active med list. Per patient, she used to have this rx on her med list. Please send to [facility] pharmacy. Thanks in advance.

## 2022-11-15 ENCOUNTER — Ambulatory Visit: Payer: Medicare Other | Admitting: Physician Assistant

## 2022-11-15 VITALS — BP 110/78 | HR 82 | Ht 63.0 in | Wt 133.0 lb

## 2022-11-15 DIAGNOSIS — E538 Deficiency of other specified B group vitamins: Secondary | ICD-10-CM

## 2022-11-15 MED ORDER — CYANOCOBALAMIN 1000 MCG/ML IJ SOLN
1000.0000 ug | Freq: Once | INTRAMUSCULAR | Status: AC
Start: 2022-11-15 — End: 2022-11-15
  Administered 2022-11-15: 1000 ug via INTRAMUSCULAR

## 2022-11-15 MED ORDER — FERROUS SULFATE 325 (65 FE) MG PO TBEC: 325.0000 mg | DELAYED_RELEASE_TABLET | Freq: Every day | ORAL | 3 refills | Status: DC

## 2022-11-15 NOTE — Telephone Encounter (Signed)
Sent to pharmacy 

## 2022-11-15 NOTE — Progress Notes (Signed)
Pt states she feels tired and knows she has low iron.   Patient is here for a vitamin B12 injection. Denies GI problems or dizziness.   Patient tolerated injection well without complications. Pt is advised to schedule next injection in 30 days.  Location:  [place]

## 2022-11-15 NOTE — Progress Notes (Signed)
Agree with above plan. 

## 2022-11-19 NOTE — Telephone Encounter (Signed)
Pt called.  She is following up on refill of Levothyroxine.

## 2022-11-28 ENCOUNTER — Ambulatory Visit (INDEPENDENT_AMBULATORY_CARE_PROVIDER_SITE_OTHER): Payer: Medicare Other

## 2022-11-28 ENCOUNTER — Telehealth: Payer: Self-pay | Admitting: Physician Assistant

## 2022-11-28 DIAGNOSIS — Z1231 Encounter for screening mammogram for malignant neoplasm of breast: Secondary | ICD-10-CM

## 2022-11-28 NOTE — Telephone Encounter (Signed)
Patient called in stating that she feels like b-12 injection is not helping her anymore. She feels exhausted, started taking naps, and has tried all remedies suggested. Wants to know if she can get another b-12 injection or try something else

## 2022-11-29 ENCOUNTER — Ambulatory Visit (HOSPITAL_BASED_OUTPATIENT_CLINIC_OR_DEPARTMENT_OTHER): Payer: Medicare Other | Attending: Physician Assistant | Admitting: Internal Medicine

## 2022-11-29 DIAGNOSIS — G478 Other sleep disorders: Secondary | ICD-10-CM

## 2022-11-29 DIAGNOSIS — R0683 Snoring: Secondary | ICD-10-CM

## 2022-11-29 NOTE — Progress Notes (Signed)
Normal mammogram. Follow up in 1 year.

## 2022-11-29 NOTE — Telephone Encounter (Signed)
We can check B12 again to see what range she is in but if levels are good b12 is not the answer. Any other medications that could be causing this?

## 2022-12-06 ENCOUNTER — Ambulatory Visit (HOSPITAL_BASED_OUTPATIENT_CLINIC_OR_DEPARTMENT_OTHER): Payer: Medicare Other | Admitting: Internal Medicine

## 2022-12-11 ENCOUNTER — Telehealth: Payer: Self-pay | Admitting: Physician Assistant

## 2022-12-11 NOTE — Telephone Encounter (Signed)
Patient called stating the at home sleep study has not recorded anything twice now. She would ike to know if she should try a third time or wait for blood work to be completed. Please advise.

## 2022-12-11 NOTE — Telephone Encounter (Signed)
Patient has been informed and will try it one more time.

## 2022-12-12 ENCOUNTER — Ambulatory Visit: Payer: Medicare Other | Admitting: Family Medicine

## 2022-12-14 LAB — CMP14+EGFR
ALT: 20 [IU]/L (ref 0–32)
AST: 18 [IU]/L (ref 0–40)
Albumin: 4.7 g/dL (ref 3.9–4.9)
Alkaline Phosphatase: 18 [IU]/L — ABNORMAL LOW (ref 44–121)
BUN/Creatinine Ratio: 11 (ref 9–23)
BUN: 15 mg/dL (ref 6–24)
Bilirubin Total: 0.5 mg/dL (ref 0.0–1.2)
CO2: 22 mmol/L (ref 20–29)
Calcium: 10.2 mg/dL (ref 8.7–10.2)
Chloride: 101 mmol/L (ref 96–106)
Creatinine, Ser: 1.41 mg/dL — ABNORMAL HIGH (ref 0.57–1.00)
Globulin, Total: 2.1 g/dL (ref 1.5–4.5)
Glucose: 82 mg/dL (ref 70–99)
Potassium: 4.2 mmol/L (ref 3.5–5.2)
Sodium: 140 mmol/L (ref 134–144)
Total Protein: 6.8 g/dL (ref 6.0–8.5)
eGFR: 48 mL/min/{1.73_m2} — ABNORMAL LOW (ref >59)

## 2022-12-14 LAB — FERRITIN: Ferritin: 265 ng/mL — ABNORMAL HIGH (ref 15–150)

## 2022-12-17 ENCOUNTER — Other Ambulatory Visit: Payer: Self-pay | Admitting: Physician Assistant

## 2022-12-17 NOTE — Progress Notes (Signed)
Liver enzymes improved.  Ferritin is trending down. Stay off all iron supplements.  Kidney function is down again. Your ultrasound in June showed normal kidneys. I think we should consider medication to help preserve function. Avoid any NSAIdS. Make sure staying hydrated. Recheck in 3 months.

## 2022-12-18 MED ORDER — DAPAGLIFLOZIN PROPANEDIOL 10 MG PO TABS
10.0000 mg | ORAL_TABLET | Freq: Every day | ORAL | 0 refills | Status: DC
Start: 2022-12-18 — End: 2023-03-26

## 2022-12-18 NOTE — Addendum Note (Signed)
Addended by: Jomarie Longs on: 12/18/2022 01:01 PM   Modules accepted: Orders

## 2022-12-18 NOTE — Progress Notes (Signed)
Sent farxiga 10mg  daily to start

## 2022-12-20 ENCOUNTER — Telehealth (INDEPENDENT_AMBULATORY_CARE_PROVIDER_SITE_OTHER): Payer: Medicare Other | Admitting: Physician Assistant

## 2022-12-20 ENCOUNTER — Ambulatory Visit (HOSPITAL_BASED_OUTPATIENT_CLINIC_OR_DEPARTMENT_OTHER): Payer: Medicare Other | Admitting: Internal Medicine

## 2022-12-20 DIAGNOSIS — G43001 Migraine without aura, not intractable, with status migrainosus: Secondary | ICD-10-CM

## 2022-12-20 DIAGNOSIS — G478 Other sleep disorders: Secondary | ICD-10-CM

## 2022-12-20 DIAGNOSIS — N1831 Chronic kidney disease, stage 3a: Secondary | ICD-10-CM | POA: Insufficient documentation

## 2022-12-20 DIAGNOSIS — R0683 Snoring: Secondary | ICD-10-CM

## 2022-12-20 DIAGNOSIS — Z841 Family history of disorders of kidney and ureter: Secondary | ICD-10-CM | POA: Diagnosis not present

## 2022-12-20 DIAGNOSIS — E663 Overweight: Secondary | ICD-10-CM

## 2022-12-20 MED ORDER — RIZATRIPTAN BENZOATE 10 MG PO TBDP: 10.0000 mg | ORAL_TABLET | ORAL | 1 refills | Status: AC | PRN

## 2022-12-20 NOTE — Progress Notes (Unsigned)
..Virtual Visit via Video Note  I connected with Melissa Sharp on 12/23/22 at  1:00 PM EDT by a video enabled telemedicine application and verified that I am speaking with the correct person using two identifiers.  Location: Patient: car Provider: clinic  .Marland KitchenMarland KitchenParticipating in visit:  Patient: Melissa Sharp Provider: Tandy Gaw PA-C    I discussed the limitations of evaluation and management by telemedicine and the availability of in person appointments. The patient expressed understanding and agreed to proceed.  History of Present Illness: Pt is a 42 yo female who calls in to discuss kidney function. She is concerned because her grandmother and aunt have had kidney disease and past away from it. She is not taking any NSAIDs and does not drink alcohol.   Pt continues to feel fatigued and not good sleep. She was not able to complete testing with home sleep test. She request in lab testing.   She needs refills of her maxalt for migraine rescue.    Active Ambulatory Problems    Diagnosis Date Noted   IBS (irritable bowel syndrome) 10/27/2013   Migraine without aura 10/29/2013   Esophageal reflux 10/29/2013   Depression, major, recurrent (HCC) 10/29/2013   Mixed anxiety depressive disorder 10/29/2013   B12 deficiency 10/29/2013   Hypertriglyceridemia 10/29/2013   Elevated liver enzymes 11/02/2013   Poor venous access 01/24/2015   Post concussive syndrome 05/21/2015   Cervical pain (neck) 06/26/2015   Intractable migraine 06/30/2015   Migraine with status migrainosus 07/15/2015   Diastasis recti 02/20/2016   Low back pain with radiation, unspecified laterality 02/23/2016   Overweight (BMI 25.0-29.9) 03/20/2016   Elevated blood pressure reading 05/03/2016   Tachycardia with heart rate 100-120 beats per minute 02/25/2017   Tapeworm infection 02/25/2017   Non-seasonal allergic rhinitis 05/08/2017   Anxiety 05/08/2017   Functional diarrhea 07/01/2017   Right upper quadrant guarding  07/01/2017   Bloating 07/01/2017   Nausea 07/01/2017   Cervical radiculopathy 03/15/2018   Muscle weakness of left upper extremity 03/15/2018   Hypertension goal BP (blood pressure) < 130/80 03/15/2018   Influenza-like illness 05/22/2018   Annular skin lesion 06/25/2018   Tinea corporis 11/19/2019   Recurrent isolated sleep paralysis 12/23/2019   Staring episodes 12/23/2019   Chronic fatigue 12/23/2019   Acute abdominal pain 03/22/2020   Attention deficit hyperactivity disorder (ADHD), combined type 05/30/2020   Female fertility problems 05/30/2020   Bilateral calf pain 10/13/2020   Body aches 10/13/2020   Weakness 10/13/2020   Pneumonia due to COVID-19 virus 10/25/2020   Elevated brain natriuretic peptide (BNP) level 10/25/2020   Low hemoglobin 10/25/2020   Nonintractable headache 10/25/2020   Palpitations 10/25/2020   Acute recurrent pansinusitis 01/25/2021   Primary hypertension 01/25/2021   Chronic bilateral low back pain without sciatica 01/25/2021   Bronchitis 08/15/2021   Right foot pain 08/15/2021   Chronic left-sided low back pain with left-sided sciatica 10/05/2021   Hypothyroidism 10/06/2021   Inflammation of right ear canal 12/17/2021   Vitamin D deficiency 04/10/2022   Iron deficiency anemia 04/10/2022   Snoring 10/30/2022   Non-restorative sleep 10/30/2022   No energy 10/30/2022   Family history of CKD (chronic kidney disease) 12/20/2022   Stage 3a chronic kidney disease (HCC) 12/20/2022   Resolved Ambulatory Problems    Diagnosis Date Noted   Thyroid activity decreased 10/27/2013   Pregnancy 01/24/2015   Viral gastroenteritis 01/24/2015   Nausea and vomiting during pregnancy 03/07/2015   Dizziness 03/07/2015   Dehydration 03/07/2015   Musculoskeletal neck  pain 06/26/2015   PIH (pregnancy induced hypertension) 07/28/2015   Chalazion left upper eyelid 08/26/2018   Sinobronchitis 09/24/2019   SOB (shortness of breath) on exertion 10/13/2020   Cough  10/13/2020   SOB (shortness of breath) 10/25/2020   Past Medical History:  Diagnosis Date   Depression    Migraine    Preeclampsia        Observations/Objective: No acute distress Normal mood and appearance Normal breathing   Assessment and Plan: Marland KitchenMarland KitchenQiara was seen today for medical management of chronic issues.  Diagnoses and all orders for this visit:  Stage 3a chronic kidney disease (HCC) -     Ambulatory referral to Nephrology  Family history of CKD (chronic kidney disease) -     Ambulatory referral to Nephrology  Overweight (BMI 25.0-29.9) -     Split night study; Future  Non-restorative sleep -     Split night study; Future  Snoring -     Split night study; Future  Migraine without aura and with status migrainosus, not intractable -     rizatriptan (MAXALT-MLT) 10 MG disintegrating tablet; Take 1 tablet (10 mg total) by mouth as needed for migraine. May repeat in 2 hours if needed   Discussed the early stage of kidney disease with patient Explained the 5 stages Advised to avoid NSAIDs and Alcohol and make sure to stay hydrated Reassured that renal u/s was unremarkable Discussed ACE and SGLT-2 to help protect kidney function She agreed to start farxiga She request referral to look for other causes of CKD  Maxalt refilled  Pt not able to get good reading with home sleep apnea testing In house lab sleep study ordered    Follow Up Instructions:    I discussed the assessment and treatment plan with the patient. The patient was provided an opportunity to ask questions and all were answered. The patient agreed with the plan and demonstrated an understanding of the instructions.   The patient was advised to call back or seek an in-person evaluation if the symptoms worsen or if the condition fails to improve as anticipated.   Tandy Gaw, PA-C

## 2022-12-20 NOTE — Progress Notes (Unsigned)
Left vm for pt to return call back to clinc

## 2022-12-23 ENCOUNTER — Encounter: Payer: Self-pay | Admitting: Physician Assistant

## 2022-12-27 ENCOUNTER — Ambulatory Visit (HOSPITAL_BASED_OUTPATIENT_CLINIC_OR_DEPARTMENT_OTHER): Payer: Medicare Other | Attending: Physician Assistant | Admitting: Internal Medicine

## 2023-01-19 ENCOUNTER — Ambulatory Visit (HOSPITAL_BASED_OUTPATIENT_CLINIC_OR_DEPARTMENT_OTHER): Payer: Medicare Other | Attending: Physician Assistant | Admitting: Internal Medicine

## 2023-01-19 VITALS — Ht 63.0 in | Wt 133.0 lb

## 2023-01-19 DIAGNOSIS — I10 Essential (primary) hypertension: Secondary | ICD-10-CM | POA: Insufficient documentation

## 2023-01-19 DIAGNOSIS — R519 Headache, unspecified: Secondary | ICD-10-CM | POA: Insufficient documentation

## 2023-01-19 DIAGNOSIS — G478 Other sleep disorders: Secondary | ICD-10-CM | POA: Diagnosis present

## 2023-01-19 DIAGNOSIS — F32A Depression, unspecified: Secondary | ICD-10-CM | POA: Diagnosis not present

## 2023-01-19 DIAGNOSIS — G4733 Obstructive sleep apnea (adult) (pediatric): Secondary | ICD-10-CM | POA: Insufficient documentation

## 2023-01-19 DIAGNOSIS — R5383 Other fatigue: Secondary | ICD-10-CM | POA: Diagnosis not present

## 2023-01-19 DIAGNOSIS — R0683 Snoring: Secondary | ICD-10-CM | POA: Insufficient documentation

## 2023-01-19 DIAGNOSIS — G4700 Insomnia, unspecified: Secondary | ICD-10-CM | POA: Diagnosis not present

## 2023-01-19 DIAGNOSIS — Z6823 Body mass index (BMI) 23.0-23.9, adult: Secondary | ICD-10-CM | POA: Diagnosis not present

## 2023-01-19 DIAGNOSIS — E663 Overweight: Secondary | ICD-10-CM | POA: Insufficient documentation

## 2023-01-25 DIAGNOSIS — E663 Overweight: Secondary | ICD-10-CM

## 2023-01-25 DIAGNOSIS — G4733 Obstructive sleep apnea (adult) (pediatric): Secondary | ICD-10-CM

## 2023-01-25 NOTE — Procedures (Signed)
    Patient Name: Melissa Sharp, Melissa Sharp Date: 01/19/2023 Gender: Female D.O.B: [birth year] Age (years): 42 Referring Provider: Tandy Gaw Height (inches): 63 Interpreting Physician: Jetty Duhamel MD, ABSM Weight (lbs): 133 RPSGT: [place], [place] BMI: 24 MRN: [id number] Neck Size: 13.00  CLINICAL INFORMATION Sleep Study Type: NPSG Indication for sleep study: Depression, Fatigue, Hypertension, Insomnia, Morning Headaches, Snoring Epworth Sleepiness Score: 5  SLEEP STUDY TECHNIQUE As per the AASM Manual for the Scoring of Sleep and Associated Events v2.3 (April 2016) with a hypopnea requiring 4% desaturations.  The channels recorded and monitored were frontal, central and occipital EEG, electrooculogram (EOG), submentalis EMG (chin), nasal and oral airflow, thoracic and abdominal wall motion, anterior tibialis EMG, snore microphone, electrocardiogram, and pulse oximetry.  MEDICATIONS Medications self-administered by patient taken the night of the study : Trazodone  SLEEP ARCHITECTURE The study was initiated at 10:43:51 PM and ended at 5:14:07 AM.  Sleep onset time was 18.0 minutes and the sleep efficiency was 77.9%. The total sleep time was 304 minutes.  Stage REM latency was 221.5 minutes.  The patient spent 6.9% of the night in stage N1 sleep, 71.9% in stage N2 sleep, 8.6% in stage N3 and 12.7% in REM.  Alpha intrusion was absent.  Supine sleep was 72.86%.  RESPIRATORY PARAMETERS The overall apnea/hypopnea index (AHI) was 7.1 per hour. There were 20 total apneas, including 9 obstructive, 9 central and 2 mixed apneas. There were 16 hypopneas and 24 RERAs.  The AHI during Stage REM sleep was 12.5 per hour.  AHI while supine was 8.9 per hour.  The mean oxygen saturation was 95.1%. The minimum SpO2 during sleep was 75.0%.  moderate snoring was noted during this study.  CARDIAC DATA The 2 lead EKG demonstrated sinus rhythm. The mean heart rate was 70.7 beats per  minute. Other EKG findings include: None.  LEG MOVEMENT DATA The total PLMS were 0 with a resulting PLMS index of 0.0. Associated arousal with leg movement index was 0.2 .  IMPRESSIONS - Mild obstructive sleep apnea occurred during this study (AHI = 7.1/h). - No significant central sleep apnea occurred during this study (CAI = 1.8/h). - Oxygen desaturation was noted during this study (Min O2 = 75.0%, Mean 95.1%). - The patient snored with moderate snoring volume. - No cardiac abnormalities were noted during this study. - Clinically significant periodic limb movements did not occur during sleep. No significant associated arousals. - Sleep architecture fragmented by frequent brief arousals and sleep stage changes, bathroom x 1.  DIAGNOSIS - Obstructive Sleep Apnea (G47.33)  RECOMMENDATIONS - Treatment for very mild OSA is guided by symptoms and co-morbidity. Conservative measures may include observation, weight loss and sleep position off back. Other options, including CPAP, a fitted oral appliance or ENT evaluation, would be based on clinical judgment. - Sleep hygiene should be reviewed to assess factors that may improve sleep quality. - Weight management and regular exercise should be initiated or continued if appropriate.  [Electronically signed] 01/25/2023 01:31 PM  Jetty Duhamel MD, ABSM Diplomate, American Board of Sleep Medicine NPI: [id number]                         Jetty Duhamel Diplomate, American Board of Sleep Medicine  ELECTRONICALLY SIGNED ON:  01/25/2023, 1:23 PM [facility] PH: [phone]   FX: [phone] ACCREDITED BY THE AMERICAN ACADEMY OF SLEEP MEDICINE

## 2023-01-29 ENCOUNTER — Encounter: Payer: Self-pay | Admitting: Physician Assistant

## 2023-01-29 ENCOUNTER — Ambulatory Visit (INDEPENDENT_AMBULATORY_CARE_PROVIDER_SITE_OTHER): Payer: Medicare Other | Admitting: Physician Assistant

## 2023-01-29 VITALS — BP 115/64 | HR 98 | Ht 63.0 in | Wt 132.0 lb

## 2023-01-29 DIAGNOSIS — R7989 Other specified abnormal findings of blood chemistry: Secondary | ICD-10-CM | POA: Diagnosis not present

## 2023-01-29 DIAGNOSIS — G4733 Obstructive sleep apnea (adult) (pediatric): Secondary | ICD-10-CM | POA: Diagnosis not present

## 2023-01-29 DIAGNOSIS — N1831 Chronic kidney disease, stage 3a: Secondary | ICD-10-CM

## 2023-01-29 DIAGNOSIS — E538 Deficiency of other specified B group vitamins: Secondary | ICD-10-CM

## 2023-01-29 NOTE — Progress Notes (Signed)
Established Patient Office Visit  Subjective   Patient ID: Melissa Sharp, female    DOB: 1980/06/08  Age: 42 y.o. MRN: [id number]  Chief Complaint  Patient presents with   Medical Management of Chronic Issues    Sleep study results    HPI Pt is a 42 yo female who presents to the clinic to go over sleep study results.   Patient Active Problem List   Diagnosis Date Noted   OSA (obstructive sleep apnea) 01/29/2023   Elevated ferritin 01/29/2023   Family history of CKD (chronic kidney disease) 12/20/2022   Stage 3a chronic kidney disease (HCC) 12/20/2022   Snoring 10/30/2022   Non-restorative sleep 10/30/2022   No energy 10/30/2022   Vitamin D deficiency 04/10/2022   Iron deficiency anemia 04/10/2022   Inflammation of right ear canal 12/17/2021   Hypothyroidism 10/06/2021   Chronic left-sided low back pain with left-sided sciatica 10/05/2021   Bronchitis 08/15/2021   Right foot pain 08/15/2021   Acute recurrent pansinusitis 01/25/2021   Primary hypertension 01/25/2021   Chronic bilateral low back pain without sciatica 01/25/2021   Pneumonia due to COVID-19 virus 10/25/2020   Elevated brain natriuretic peptide (BNP) level 10/25/2020   Low hemoglobin 10/25/2020   Nonintractable headache 10/25/2020   Palpitations 10/25/2020   Bilateral calf pain 10/13/2020   Body aches 10/13/2020   Weakness 10/13/2020   Attention deficit hyperactivity disorder (ADHD), combined type 05/30/2020   Female fertility problems 05/30/2020   Acute abdominal pain 03/22/2020   Recurrent isolated sleep paralysis 12/23/2019   Staring episodes 12/23/2019   Chronic fatigue 12/23/2019   Tinea corporis 11/19/2019   Annular skin lesion 06/25/2018   Influenza-like illness 05/22/2018   Cervical radiculopathy 03/15/2018   Muscle weakness of left upper extremity 03/15/2018   Hypertension goal BP (blood pressure) < 130/80 03/15/2018   Functional diarrhea 07/01/2017   Right upper quadrant guarding 07/01/2017    Bloating 07/01/2017   Nausea 07/01/2017   Non-seasonal allergic rhinitis 05/08/2017   Anxiety 05/08/2017   Tachycardia with heart rate 100-120 beats per minute 02/25/2017   Tapeworm infection 02/25/2017   Elevated blood pressure reading 05/03/2016   Overweight (BMI 25.0-29.9) 03/20/2016   Low back pain with radiation, unspecified laterality 02/23/2016   Diastasis recti 02/20/2016   Migraine with status migrainosus 07/15/2015   Intractable migraine 06/30/2015   Cervical pain (neck) 06/26/2015   Post concussive syndrome 05/21/2015   Poor venous access 01/24/2015   Elevated liver enzymes 11/02/2013   Migraine without aura 10/29/2013   Esophageal reflux 10/29/2013   Depression, major, recurrent (HCC) 10/29/2013   Mixed anxiety depressive disorder 10/29/2013   Low serum vitamin B12 10/29/2013   Hypertriglyceridemia 10/29/2013   IBS (irritable bowel syndrome) 10/27/2013   Past Medical History:  Diagnosis Date   Anxiety    Chalazion left upper eyelid 08/26/2018   Depression    Hypothyroidism    Migraine    PIH (pregnancy induced hypertension) 07/28/2015   Preeclampsia    Sinobronchitis 09/24/2019   Thyroid activity decreased 10/27/2013   Past Surgical History:  Procedure Laterality Date   TONSILECTOMY/ADENOIDECTOMY WITH MYRINGOTOMY     Family History  Problem Relation Age of Onset   Atrial fibrillation Mother    Alcohol abuse Father    Pancreatic cancer Father    Hyperlipidemia Other    Migraines Neg Hx    Allergies  Allergen Reactions   Codeine Itching   Labetalol     Numbness and tingling/jerking/aphasia   Latex Hives and Itching  Shellfish Allergy Anaphylaxis   Hydrocodone Itching   Nifedipine     Swelling/bloating      ROS See HPI.    Objective:     BP 115/64   Pulse 98   Ht 5\' 3"  (1.6 m)   Wt 132 lb (59.9 kg)   SpO2 99%   BMI 23.38 kg/m  BP Readings from Last 3 Encounters:  01/29/23 115/64  11/15/22 110/78  10/30/22 108/63   Wt Readings from  Last 3 Encounters:  01/29/23 132 lb (59.9 kg)  01/19/23 133 lb (60.3 kg)  11/15/22 133 lb (60.3 kg)      Physical Exam Constitutional:      Appearance: Normal appearance.  HENT:     Head: Normocephalic.  Cardiovascular:     Rate and Rhythm: Normal rate and regular rhythm.  Pulmonary:     Effort: Pulmonary effort is normal.  Musculoskeletal:     Right lower leg: No edema.     Left lower leg: No edema.  Neurological:     General: No focal deficit present.     Mental Status: She is alert and oriented to person, place, and time.  Psychiatric:        Mood and Affect: Mood normal.         Assessment & Plan:  Marland KitchenMarland KitchenMey was seen today for medical management of chronic issues.  Diagnoses and all orders for this visit:  OSA (obstructive sleep apnea)  Elevated ferritin -     Ferritin  Stage 3a chronic kidney disease (HCC) -     CMP14+EGFR  Low serum vitamin B12 -     B12 and Folate Panel   IMPRESSIONS - Mild obstructive sleep apnea occurred during this study (AHI = 7.1/h). - No significant central sleep apnea occurred during this study (CAI = 1.8/h). - Oxygen desaturation was noted during this study (Min O2 = 75.0%, Mean 95.1%). - The patient snored with moderate snoring volume. - No cardiac abnormalities were noted during this study. - Clinically significant periodic limb movements did not occur during sleep. No significant associated arousals. - Sleep architecture fragmented by frequent brief arousals and sleep stage changes, bathroom x 1.   DIAGNOSIS - Obstructive Sleep Apnea (G47.33)   RECOMMENDATIONS - Treatment for very mild OSA is guided by symptoms and co-morbidity. Conservative measures may include observation, weight loss and sleep position off back. Other options, including CPAP, a fitted oral appliance or ENT evaluation, would be based on clinical judgment. - Sleep hygiene should be reviewed to assess factors that may improve sleep quality. - Weight  management and regular exercise should be initiated or continued if appropriate.    After discussion patient opts for conservative measures first with trying to not sleep on her back.  She would like to avoid CPaP for now.  Consider dental appliance or ENT evaluation.  Continue to get good exercise daily for best sleep.  Labs to check CKD and low B12 and elevated ferritin.   Vitals look great today.    Tandy Gaw, PA-C

## 2023-01-30 LAB — CMP14+EGFR
ALT: 22 [IU]/L (ref 0–32)
AST: 23 [IU]/L (ref 0–40)
Albumin: 4.8 g/dL (ref 3.9–4.9)
Alkaline Phosphatase: 24 [IU]/L — ABNORMAL LOW (ref 44–121)
BUN/Creatinine Ratio: 19 (ref 9–23)
BUN: 28 mg/dL — ABNORMAL HIGH (ref 6–24)
Bilirubin Total: 0.3 mg/dL (ref 0.0–1.2)
CO2: 24 mmol/L (ref 20–29)
Calcium: 10.8 mg/dL — ABNORMAL HIGH (ref 8.7–10.2)
Chloride: 97 mmol/L (ref 96–106)
Creatinine, Ser: 1.48 mg/dL — ABNORMAL HIGH (ref 0.57–1.00)
Globulin, Total: 2.4 g/dL (ref 1.5–4.5)
Glucose: 104 mg/dL — ABNORMAL HIGH (ref 70–99)
Potassium: 3.9 mmol/L (ref 3.5–5.2)
Sodium: 138 mmol/L (ref 134–144)
Total Protein: 7.2 g/dL (ref 6.0–8.5)
eGFR: 45 mL/min/{1.73_m2} — ABNORMAL LOW (ref >59)

## 2023-01-30 LAB — FERRITIN: Ferritin: 216 ng/mL — ABNORMAL HIGH (ref 15–150)

## 2023-01-30 LAB — B12 AND FOLATE PANEL
Folate: 16.8 ng/mL (ref >3.0)
Vitamin B-12: 1015 pg/mL (ref 232–1245)

## 2023-01-31 ENCOUNTER — Telehealth: Payer: Self-pay | Admitting: Physician Assistant

## 2023-01-31 ENCOUNTER — Encounter: Payer: Self-pay | Admitting: Physician Assistant

## 2023-01-31 NOTE — Progress Notes (Signed)
Melissa Sharp  Kidney function not improved but not much change at all since last month. Kidneys look dry. Drink more water.   B12 looks GREAT!  Iron stores keep coming down but still elevated.  Melissa Sharp will you please add serum iron to these labs.

## 2023-01-31 NOTE — Telephone Encounter (Addendum)
Patient wants to know the plan regarding her lab results and that her family history is wrong  Please advise

## 2023-02-03 ENCOUNTER — Encounter: Payer: Self-pay | Admitting: Physician Assistant

## 2023-02-06 ENCOUNTER — Other Ambulatory Visit: Payer: Self-pay

## 2023-02-06 DIAGNOSIS — R7989 Other specified abnormal findings of blood chemistry: Secondary | ICD-10-CM

## 2023-02-06 NOTE — Progress Notes (Signed)
Ordered labs. See results.

## 2023-02-08 LAB — IRON: Iron: 71 ug/dL (ref 27–159)

## 2023-02-10 NOTE — Progress Notes (Signed)
Serum iron looks good.

## 2023-02-11 ENCOUNTER — Other Ambulatory Visit: Payer: Self-pay | Admitting: Physician Assistant

## 2023-02-11 DIAGNOSIS — I10 Essential (primary) hypertension: Secondary | ICD-10-CM

## 2023-02-19 ENCOUNTER — Other Ambulatory Visit: Payer: Self-pay | Admitting: Physician Assistant

## 2023-02-19 DIAGNOSIS — J302 Other seasonal allergic rhinitis: Secondary | ICD-10-CM

## 2023-02-19 DIAGNOSIS — R051 Acute cough: Secondary | ICD-10-CM

## 2023-02-19 DIAGNOSIS — J309 Allergic rhinitis, unspecified: Secondary | ICD-10-CM

## 2023-02-20 ENCOUNTER — Telehealth: Payer: Self-pay

## 2023-02-20 NOTE — Telephone Encounter (Signed)
Patient call transferred from [employer] agent. Per patient, she has been having episodes of low blood pressure and some dizziness.   Patient readings have been around 90/70's. Patient wants to know if provider recommends a lower dose or switching from hydrochlorothiazide. Please advise, thanks.

## 2023-02-21 ENCOUNTER — Telehealth: Payer: Self-pay

## 2023-02-21 DIAGNOSIS — Z309 Encounter for contraceptive management, unspecified: Secondary | ICD-10-CM

## 2023-02-21 DIAGNOSIS — Z79899 Other long term (current) drug therapy: Secondary | ICD-10-CM

## 2023-02-21 NOTE — Telephone Encounter (Signed)
Duplicate msg. Please review previous note.

## 2023-02-21 NOTE — Telephone Encounter (Signed)
Copied from CRM [id number]. Topic: Clinical - Home Health Verbal Orders >> Feb 20, 2023  4:04 PM Joanette Gula wrote: Caller/Agency: Theodoro Doing / [facility] Callback Number: [phone] Fax: [phone] Service Requested:  Seeing Dr. Jackquline Bosch and needs referral, Liver & Kidney Enzymes

## 2023-02-21 NOTE — Telephone Encounter (Signed)
Copied from CRM [id number]. Topic: Clinical - Medication Question >> Feb 21, 2023  4:32 PM Lovey Newcomer R wrote: Reason for CRM: Pt says her atorvastatin (LIPITOR) 40 MG tablet is causing severe hair loss since she's been on it and she wants to know if there is an alternative. She doesn't want to keep taking it.

## 2023-02-21 NOTE — Telephone Encounter (Signed)
Please review the CRM msg.  Patient states that since taking fenofibrate she is having significant hair loss. Patient wants to know if this is a s/e from the medication.

## 2023-02-24 MED ORDER — ROSUVASTATIN CALCIUM 20 MG PO TABS: 20.0000 mg | ORAL_TABLET | Freq: Every day | ORAL | 3 refills | Status: DC

## 2023-02-24 NOTE — Telephone Encounter (Signed)
Stop the lipitor and switch to crestor. Continue on fenobribrate.

## 2023-02-24 NOTE — Telephone Encounter (Signed)
Task completed. Patient was informed of provider's recommendation on 02/21/23. Patient is aware to contact the clinic if symptoms remains the same or change. Patient agreeable with plan.

## 2023-02-27 ENCOUNTER — Telehealth: Payer: Self-pay

## 2023-02-27 ENCOUNTER — Other Ambulatory Visit: Payer: Self-pay | Admitting: Physician Assistant

## 2023-02-27 NOTE — Telephone Encounter (Signed)
Copied from CRM [id number]. Topic: Clinical - Medication Question >> Feb 26, 2023  1:51 PM Deaijah H wrote: Reason for CRM: Patient called in stating she's seeing the medication Marcelline Deist but is not sure what it's for & also would like to know due to her having 2 new medication if she supposed to stop taking any medicines / please call [phone]

## 2023-02-27 NOTE — Telephone Encounter (Signed)
Patient has stopped taking the Lipitor rx. She is currently taking the Crestor and fenofibrate rxs as instructed. Per patient - Dr. Jackquline Bosch is an OB/GYN provider. She is requesting the referral and the Liver/Kidney enzymes because she wants to make sure it is not affecting her birth control.

## 2023-02-27 NOTE — Telephone Encounter (Signed)
Patient informed that Marcelline Deist is prescribed to preserve kidney function.  She states she spoke to another nurse this morning who helped with the other medication questions at that time.

## 2023-02-27 NOTE — Telephone Encounter (Signed)
Duplicate msg. Call update documented on a separate telephone encounter.

## 2023-02-28 NOTE — Addendum Note (Signed)
Addended by: Jomarie Longs on: 02/28/2023 04:14 PM   Modules accepted: Orders

## 2023-03-07 ENCOUNTER — Telehealth: Payer: Self-pay

## 2023-03-07 NOTE — Telephone Encounter (Signed)
Will patient need an appointment to discuss?

## 2023-03-07 NOTE — Telephone Encounter (Signed)
Copied from CRM [id number]. Topic: Clinical - Medical Advice >> Mar 07, 2023 11:44 AM Clayton Bibles wrote: Reason for CRM: Dr. Effie Shy suggested [name] to ask Dr. Caleen Essex to prescribe aldactone for hair loss. Please call Ankita to discuss.

## 2023-03-07 NOTE — Telephone Encounter (Signed)
Needs appt. I don't see on problem list.  OK  for virtual

## 2023-03-10 ENCOUNTER — Other Ambulatory Visit: Payer: Self-pay | Admitting: Nephrology

## 2023-03-10 DIAGNOSIS — N1831 Chronic kidney disease, stage 3a: Secondary | ICD-10-CM

## 2023-03-10 LAB — LAB REPORT - SCANNED: EGFR: 47

## 2023-03-10 NOTE — Telephone Encounter (Signed)
Attempted call to patient- left voice mail message to return our call.

## 2023-03-10 NOTE — Telephone Encounter (Signed)
Patient informed and will call back to schedule. 

## 2023-03-10 NOTE — Telephone Encounter (Signed)
Copied from CRM [id number]. Topic: General - Call Back - No Documentation >> Mar 10, 2023  9:05 AM Lorretta Harp wrote: Reason for CRM: Patient was returning a call to [facility] from Dr. Teodora Medici office

## 2023-03-17 ENCOUNTER — Telehealth (INDEPENDENT_AMBULATORY_CARE_PROVIDER_SITE_OTHER): Payer: Medicare Other | Admitting: Physician Assistant

## 2023-03-17 VITALS — Ht 63.0 in | Wt 134.0 lb

## 2023-03-17 DIAGNOSIS — L659 Nonscarring hair loss, unspecified: Secondary | ICD-10-CM | POA: Insufficient documentation

## 2023-03-17 DIAGNOSIS — N1831 Chronic kidney disease, stage 3a: Secondary | ICD-10-CM

## 2023-03-17 MED ORDER — SPIRONOLACTONE 25 MG PO TABS: 25.0000 mg | ORAL_TABLET | Freq: Every day | ORAL | 1 refills | Status: DC

## 2023-03-17 NOTE — Progress Notes (Signed)
..Virtual Visit via Video Note  I connected with Melissa Sharp on 03/18/23 at  2:20 PM EST by a video enabled telemedicine application and verified that I am speaking with the correct person using two identifiers.  Location: Patient: home Provider: clinic  .Marland KitchenParticipating in visit:  Patient: Melissa Sharp Provider: Tandy Gaw PA-C   I discussed the limitations of evaluation and management by telemedicine and the availability of in person appointments. The patient expressed understanding and agreed to proceed.  History of Present Illness: Pt is concerned about hairloss. Noticed for the last 2-3 months and more on top of head and hairline. She thought it could be her cholesterol medication but we changed that with no improvement. She admits to not eating healthy. No other medication changes. She is on vitamins for hair.    .. Active Ambulatory Problems    Diagnosis Date Noted   IBS (irritable bowel syndrome) 10/27/2013   Migraine without aura 10/29/2013   Esophageal reflux 10/29/2013   Depression, major, recurrent (HCC) 10/29/2013   Mixed anxiety depressive disorder 10/29/2013   Low serum vitamin B12 10/29/2013   Hypertriglyceridemia 10/29/2013   Elevated liver enzymes 11/02/2013   Poor venous access 01/24/2015   Post concussive syndrome 05/21/2015   Cervical pain (neck) 06/26/2015   Intractable migraine 06/30/2015   Migraine with status migrainosus 07/15/2015   Diastasis recti 02/20/2016   Low back pain with radiation, unspecified laterality 02/23/2016   Overweight (BMI 25.0-29.9) 03/20/2016   Elevated blood pressure reading 05/03/2016   Tachycardia with heart rate 100-120 beats per minute 02/25/2017   Tapeworm infection 02/25/2017   Non-seasonal allergic rhinitis 05/08/2017   Anxiety 05/08/2017   Functional diarrhea 07/01/2017   Right upper quadrant guarding 07/01/2017   Bloating 07/01/2017   Nausea 07/01/2017   Cervical radiculopathy 03/15/2018   Muscle weakness of left  upper extremity 03/15/2018   Hypertension goal BP (blood pressure) < 130/80 03/15/2018   Influenza-like illness 05/22/2018   Annular skin lesion 06/25/2018   Tinea corporis 11/19/2019   Recurrent isolated sleep paralysis 12/23/2019   Staring episodes 12/23/2019   Chronic fatigue 12/23/2019   Attention deficit hyperactivity disorder (ADHD), combined type 05/30/2020   Female fertility problems 05/30/2020   Bilateral calf pain 10/13/2020   Body aches 10/13/2020   Weakness 10/13/2020   Elevated brain natriuretic peptide (BNP) level 10/25/2020   Low hemoglobin 10/25/2020   Nonintractable headache 10/25/2020   Palpitations 10/25/2020   Primary hypertension 01/25/2021   Chronic bilateral low back pain without sciatica 01/25/2021   Right foot pain 08/15/2021   Chronic left-sided low back pain with left-sided sciatica 10/05/2021   Hypothyroidism 10/06/2021   Vitamin D deficiency 04/10/2022   Iron deficiency anemia 04/10/2022   Snoring 10/30/2022   Non-restorative sleep 10/30/2022   No energy 10/30/2022   Family history of CKD (chronic kidney disease) 12/20/2022   Stage 3a chronic kidney disease (HCC) 12/20/2022   OSA (obstructive sleep apnea) 01/29/2023   Elevated ferritin 01/29/2023   Hair loss 03/17/2023   Resolved Ambulatory Problems    Diagnosis Date Noted   Thyroid activity decreased 10/27/2013   Pregnancy 01/24/2015   Viral gastroenteritis 01/24/2015   Nausea and vomiting during pregnancy 03/07/2015   Dizziness 03/07/2015   Dehydration 03/07/2015   Musculoskeletal neck pain 06/26/2015   PIH (pregnancy induced hypertension) 07/28/2015   Chalazion left upper eyelid 08/26/2018   Sinobronchitis 09/24/2019   Acute abdominal pain 03/22/2020   SOB (shortness of breath) on exertion 10/13/2020   Cough 10/13/2020   Pneumonia  due to COVID-19 virus 10/25/2020   SOB (shortness of breath) 10/25/2020   Acute recurrent pansinusitis 01/25/2021   Bronchitis 08/15/2021   Inflammation of  right ear canal 12/17/2021   Past Medical History:  Diagnosis Date   Depression    Migraine    Preeclampsia        Observations/Objective: No acute distress Diffuse hair loss but more noticeable around the top sides of scalp and hairline  Assessment and Plan: Marland KitchenMarland KitchenNekayla was seen today for alopecia.  Diagnoses and all orders for this visit:  Hair loss -     spironolactone (ALDACTONE) 25 MG tablet; Take 1 tablet (25 mg total) by mouth daily.  Stage 3a chronic kidney disease (HCC)   Trial of spirolactone Encouraged good healthy diet with protein Continue on biotin and vitamin for hair and nails   Follow Up Instructions:    I discussed the assessment and treatment plan with the patient. The patient was provided an opportunity to ask questions and all were answered. The patient agreed with the plan and demonstrated an understanding of the instructions.   The patient was advised to call back or seek an in-person evaluation if the symptoms worsen or if the condition fails to improve as anticipated.    Tandy Gaw, PA-C

## 2023-03-17 NOTE — Telephone Encounter (Signed)
Per patient's chart - external labs were completed and scanned into the chart on 03/10/23. Does the patient need to complete the CMP ordered by the provider? Please advise, thanks.

## 2023-03-18 ENCOUNTER — Ambulatory Visit
Admission: RE | Admit: 2023-03-18 | Discharge: 2023-03-18 | Disposition: A | Discharge status: Home / Self Care | Payer: Medicare Other | Source: Ambulatory Visit | Attending: Nephrology | Admitting: Nephrology

## 2023-03-18 DIAGNOSIS — N1831 Chronic kidney disease, stage 3a: Secondary | ICD-10-CM

## 2023-03-25 ENCOUNTER — Other Ambulatory Visit: Payer: Self-pay | Admitting: Physician Assistant

## 2023-03-25 DIAGNOSIS — E039 Hypothyroidism, unspecified: Secondary | ICD-10-CM

## 2023-04-08 ENCOUNTER — Encounter: Payer: Self-pay | Admitting: Physician Assistant

## 2023-04-08 LAB — CMP14+EGFR
ALT: 18 [IU]/L (ref 0–32)
AST: 16 [IU]/L (ref 0–40)
Albumin: 4.6 g/dL (ref 3.9–4.9)
Alkaline Phosphatase: 24 [IU]/L — ABNORMAL LOW (ref 44–121)
BUN/Creatinine Ratio: 10 (ref 9–23)
BUN: 15 mg/dL (ref 6–24)
Bilirubin Total: 0.3 mg/dL (ref 0.0–1.2)
CO2: 21 mmol/L (ref 20–29)
Calcium: 10.2 mg/dL (ref 8.7–10.2)
Chloride: 102 mmol/L (ref 96–106)
Creatinine, Ser: 1.52 mg/dL — ABNORMAL HIGH (ref 0.57–1.00)
Globulin, Total: 2.1 g/dL (ref 1.5–4.5)
Glucose: 92 mg/dL (ref 70–99)
Potassium: 4.6 mmol/L (ref 3.5–5.2)
Sodium: 140 mmol/L (ref 134–144)
Total Protein: 6.7 g/dL (ref 6.0–8.5)
eGFR: 44 mL/min/{1.73_m2} — ABNORMAL LOW (ref >59)

## 2023-04-08 NOTE — Progress Notes (Signed)
GFR is 44 today, 2 months ago was 45.  Calcium improved.

## 2023-04-23 ENCOUNTER — Other Ambulatory Visit: Payer: Self-pay | Admitting: Physician Assistant

## 2023-04-23 DIAGNOSIS — E039 Hypothyroidism, unspecified: Secondary | ICD-10-CM

## 2023-05-12 ENCOUNTER — Other Ambulatory Visit: Payer: Self-pay | Admitting: Physician Assistant

## 2023-05-14 ENCOUNTER — Other Ambulatory Visit: Payer: Self-pay | Admitting: Physician Assistant

## 2023-05-14 MED ORDER — FENOFIBRATE 145 MG PO TABS
145.0000 mg | ORAL_TABLET | Freq: Every day | ORAL | 0 refills | Status: DC
Start: 2023-05-14 — End: 2023-08-04

## 2023-05-14 NOTE — Telephone Encounter (Signed)
 Copied from CRM [id number]. Topic: Clinical - Medication Refill >> May 14, 2023  1:30 PM Dennison Nancy wrote: Most Recent Primary Care Visit:  Provider: Jomarie Longs  Department: [facility]  Visit Type: OFFICE VISIT  Date: 03/17/2023  Medication: fenofibrate (TRICOR) 145 MG tablet   Has the patient contacted their pharmacy? Yes (Agent: If no, request that the patient contact the pharmacy for the refill. If patient does not wish to contact the pharmacy document the reason why and proceed with request.) (Agent: If yes, when and what did the pharmacy advise?)  Is this the correct pharmacy for this prescription? Yes If no, delete pharmacy and type the correct one.  This is the patient's preferred pharmacy:  [facility] [place], [place] - [address] [place] [address] [place] [address] Phone: [phone] Fax: [phone]   Has the prescription been filled recently? No  Is the patient out of the medication? No , have 2 days left not suppose to miss dose   Has the patient been seen for an appointment in the last year OR does the patient have an upcoming appointment? Yes  Can we respond through MyChart? No  Agent: Please be advised that Rx refills may take up to 3 business days. We ask that you follow-up with your pharmacy.

## 2023-05-19 ENCOUNTER — Other Ambulatory Visit: Payer: Self-pay | Admitting: Physician Assistant

## 2023-05-19 DIAGNOSIS — R051 Acute cough: Secondary | ICD-10-CM

## 2023-05-19 DIAGNOSIS — J309 Allergic rhinitis, unspecified: Secondary | ICD-10-CM

## 2023-05-19 DIAGNOSIS — J302 Other seasonal allergic rhinitis: Secondary | ICD-10-CM

## 2023-06-03 ENCOUNTER — Telehealth (INDEPENDENT_AMBULATORY_CARE_PROVIDER_SITE_OTHER): Admitting: Physician Assistant

## 2023-06-03 DIAGNOSIS — N1831 Chronic kidney disease, stage 3a: Secondary | ICD-10-CM

## 2023-06-03 DIAGNOSIS — B3731 Acute candidiasis of vulva and vagina: Secondary | ICD-10-CM | POA: Insufficient documentation

## 2023-06-03 DIAGNOSIS — Z841 Family history of disorders of kidney and ureter: Secondary | ICD-10-CM

## 2023-06-03 DIAGNOSIS — T887XXA Unspecified adverse effect of drug or medicament, initial encounter: Secondary | ICD-10-CM | POA: Diagnosis not present

## 2023-06-03 MED ORDER — DAPAGLIFLOZIN PROPANEDIOL 10 MG PO TABS: 10.0000 mg | ORAL_TABLET | Freq: Every day | ORAL | 3 refills | Status: AC

## 2023-06-03 MED ORDER — FLUCONAZOLE 150 MG PO TABS: 150.0000 mg | ORAL_TABLET | Freq: Once | ORAL | 1 refills | Status: AC

## 2023-06-03 NOTE — Progress Notes (Unsigned)
.  Virtual Visit via Video Note  I connected with Melissa Sharp on 06/04/23 at  1:20 PM EDT by a video enabled telemedicine application and verified that I am speaking with the correct person using two identifiers.  Location: Patient: home Provider: clinic  .Marland KitchenParticipating in visit:  Patient: Melissa Sharp Provider: Tandy Gaw PA-C   I discussed the limitations of evaluation and management by telemedicine and the availability of in person appointments. The patient expressed understanding and agreed to proceed.  History of Present Illness: Pt calls into the clinic to discuss symptoms of yeast infection. She has had these multiple times and knows what they feel like. She is on farxiga which makes them more likely. OTC monistat did not help. She denies any painful urination or fever. She is having itchy, discharge and vaginal irritation.   She needs refill of farxiga for CKD.     Observations/Objective: No acute distress   Assessment and Plan: Marland KitchenMarland KitchenDiagnoses and all orders for this visit:  Yeast vaginitis -     fluconazole (DIFLUCAN) 150 MG tablet; Take 1 tablet (150 mg total) by mouth once for 1 dose. Repeat in 48 to 72 hours if symptoms persist.  Medication side effect  Family history of CKD (chronic kidney disease) -     dapagliflozin propanediol (FARXIGA) 10 MG TABS tablet; Take 1 tablet (10 mg total) by mouth daily.  Stage 3a chronic kidney disease (HCC) -     dapagliflozin propanediol (FARXIGA) 10 MG TABS tablet; Take 1 tablet (10 mg total) by mouth daily.   Farxiga refilled Diflucan sent for yeast infection Follow up in office if symptoms persist  Follow Up Instructions:    I discussed the assessment and treatment plan with the patient. The patient was provided an opportunity to ask questions and all were answered. The patient agreed with the plan and demonstrated an understanding of the instructions.   The patient was advised to call back or seek an in-person evaluation  if the symptoms worsen or if the condition fails to improve as anticipated.   Tandy Gaw, PA-C

## 2023-06-04 ENCOUNTER — Encounter: Payer: Self-pay | Admitting: Physician Assistant

## 2023-06-16 ENCOUNTER — Telehealth (INDEPENDENT_AMBULATORY_CARE_PROVIDER_SITE_OTHER): Admitting: Physician Assistant

## 2023-06-16 ENCOUNTER — Ambulatory Visit: Payer: Self-pay | Admitting: Physician Assistant

## 2023-06-16 VITALS — Ht 62.0 in | Wt 135.0 lb

## 2023-06-16 DIAGNOSIS — J309 Allergic rhinitis, unspecified: Secondary | ICD-10-CM | POA: Diagnosis not present

## 2023-06-16 MED ORDER — FLUTICASONE PROPIONATE 50 MCG/ACT NA SUSP
2.0000 | Freq: Every day | NASAL | 0 refills | Status: DC
Start: 2023-06-16 — End: 2023-10-30

## 2023-06-16 MED ORDER — METHYLPREDNISOLONE 4 MG PO TBPK: ORAL_TABLET | ORAL | 0 refills | Status: DC

## 2023-06-16 MED ORDER — PROMETHAZINE-DM 6.25-15 MG/5ML PO SYRP
ORAL_SOLUTION | ORAL | 0 refills | Status: DC
Start: 2023-06-16 — End: 2023-10-30

## 2023-06-16 NOTE — Telephone Encounter (Signed)
 Copied from CRM [id number]. Topic: Clinical - Red Word Triage >> Jun 16, 2023  8:29 AM Maxwell Marion wrote: [facility] that prompted transfer to Nurse Triage: chest pain and coughing up discolored (green) mucous, going on for about a week  Chief Complaint: Cough Symptoms: Nasal discharge Frequency: Over a week Pertinent Negatives: Patient denies SOB Disposition: [] ED /[] Urgent Care (no appt availability in office) / [x] Appointment(In office/virtual)/ []  [facility]/ [] Home Care/ [] Refused Recommended Disposition /[] [facility]/ []  Follow-up with PCP Additional Notes: Patient called in to report a productive cough that has been present for over a week. Patient stated the cough produces green phlegm. Patient stated the cough causes chest discomfort. Patient denied SOB and fever. This RN advised patient to see provider within 24 hours, per protocol. Patient requested a virtual appointment because she is home with two sick kids. Same day virtual appointment scheduled with PCP. This RN provided care advice and advised patient to call back if symptoms worsen. Patient complied.   Reason for Disposition  SEVERE coughing spells (e.g., whooping sound after coughing, vomiting after coughing)  Answer Assessment - Initial Assessment Questions 1. ONSET: "When did the cough begin?"      Over a week ago 2. SEVERITY: "How bad is the cough today?"      States the cough hurts 3. SPUTUM: "Describe the color of your sputum" (none, dry cough; clear, white, yellow, green)     Green 4. HEMOPTYSIS: "Are you coughing up any blood?" If so ask: "How much?" (flecks, streaks, tablespoons, etc.)     Denies 5. DIFFICULTY BREATHING: "Are you having difficulty breathing?" If Yes, ask: "How bad is it?" (e.g., mild, moderate, severe)    - MILD: No SOB at rest, mild SOB with walking, speaks normally in sentences, can lie down, no retractions, pulse < 100.    - MODERATE: SOB at rest, SOB with minimal exertion and  prefers to sit, cannot lie down flat, speaks in phrases, mild retractions, audible wheezing, pulse 100-120.    - SEVERE: Very SOB at rest, speaks in single words, struggling to breathe, sitting hunched forward, retractions, pulse > 120      Denies 6. FEVER: "Do you have a fever?" If Yes, ask: "What is your temperature, how was it measured, and when did it start?"     Denies 7. CARDIAC HISTORY: "Do you have any history of heart disease?" (e.g., heart attack, congestive heart failure)      Denies 8. LUNG HISTORY: "Do you have any history of lung disease?"  (e.g., pulmonary embolus, asthma, emphysema)     Denies 10. OTHER SYMPTOMS: "Do you have any other symptoms?" (e.g., runny nose, wheezing, chest pain)     Nasal congestion (green mucous), chest discomfort due to cough, denies wheezing  Protocols used: Cough - Acute Productive-A-AH

## 2023-06-16 NOTE — Progress Notes (Unsigned)
..  Virtual Visit via Video Note  I connected with Melissa Sharp on 06/17/23 at 11:30 AM EDT by a video enabled telemedicine application and verified that I am speaking with the correct person using two identifiers.  Location: Patient: home Provider: clinic  .Marland KitchenParticipating in visit:  Patient: Melissa Sharp Provider: Tandy Gaw PA-C   I discussed the limitations of evaluation and management by telemedicine and the availability of in person appointments. The patient expressed understanding and agreed to proceed.  History of Present Illness: Pt is a 43 yo female who calls into the clinic with worsening allergies. She is taking singulair and atrovent. She is a dog walker and outside a lot. She has clear sinus drainage and cough and sneezing. She has having trouble sleeping at night. No fever, chills, body aches, SOB, wheezing.     Observations/Objective: No acute distress Normal breathing Congested sounding Normal mood and appearance  .Marland Kitchen Today's Vitals   06/16/23 1143  Weight: 135 lb (61.2 kg)  Height: 5\' 2"  (1.575 m)   Body mass index is 24.69 kg/m.   Assessment and Plan: Marland KitchenMarland KitchenChrissy was seen today for cough.  Diagnoses and all orders for this visit:  Allergic sinusitis -     fluticasone (FLONASE) 50 MCG/ACT nasal spray; Place 2 sprays into both nostrils daily. -     methylPREDNISolone (MEDROL DOSEPAK) 4 MG TBPK tablet; Take as directed by package insert. -     promethazine-dextromethorphan (PROMETHAZINE-DM) 6.25-15 MG/5ML syrup; Take 5mL for cough at bedtime.   Does not appear to be bacterial at this time Continue singulair/atrovent and add zyrtec Add flonase  Start medrol dose pack Cough syrup for at bedtime Follow up as needed    Follow Up Instructions:    I discussed the assessment and treatment plan with the patient. The patient was provided an opportunity to ask questions and all were answered. The patient agreed with the plan and demonstrated an understanding of  the instructions.   The patient was advised to call back or seek an in-person evaluation if the symptoms worsen or if the condition fails to improve as anticipated.   Tandy Gaw, PA-C

## 2023-06-19 ENCOUNTER — Telehealth: Payer: Self-pay

## 2023-06-19 NOTE — Telephone Encounter (Signed)
 Copied from CRM [id number]. Topic: Clinical - Prescription Issue >> Jun 19, 2023 10:36 AM Maree Krabbe H wrote: Reason for CRM: Patient wanted her rx refilled for her yeast infection, patient is not aware of the name of the medicine, I looked through her chart and couldn't determine which medicine was specifically for that.

## 2023-06-20 MED ORDER — FLUCONAZOLE 150 MG PO TABS: 150.0000 mg | ORAL_TABLET | Freq: Once | ORAL | 0 refills | Status: AC

## 2023-06-20 NOTE — Addendum Note (Signed)
 Addended by: Jomarie Longs on: 06/20/2023 12:53 PM   Modules accepted: Orders

## 2023-07-21 ENCOUNTER — Other Ambulatory Visit: Payer: Self-pay | Admitting: Physician Assistant

## 2023-07-21 DIAGNOSIS — E039 Hypothyroidism, unspecified: Secondary | ICD-10-CM

## 2023-08-02 ENCOUNTER — Other Ambulatory Visit: Payer: Self-pay | Admitting: Physician Assistant

## 2023-09-09 ENCOUNTER — Other Ambulatory Visit: Payer: Self-pay | Admitting: Physician Assistant

## 2023-09-09 DIAGNOSIS — L659 Nonscarring hair loss, unspecified: Secondary | ICD-10-CM

## 2023-09-17 ENCOUNTER — Other Ambulatory Visit: Payer: Self-pay | Admitting: Physician Assistant

## 2023-09-29 ENCOUNTER — Other Ambulatory Visit: Payer: Self-pay | Admitting: Physician Assistant

## 2023-09-29 DIAGNOSIS — J302 Other seasonal allergic rhinitis: Secondary | ICD-10-CM

## 2023-09-29 DIAGNOSIS — R051 Acute cough: Secondary | ICD-10-CM

## 2023-09-29 DIAGNOSIS — J309 Allergic rhinitis, unspecified: Secondary | ICD-10-CM

## 2023-10-15 ENCOUNTER — Other Ambulatory Visit: Payer: Self-pay | Admitting: Physician Assistant

## 2023-10-15 DIAGNOSIS — E039 Hypothyroidism, unspecified: Secondary | ICD-10-CM

## 2023-10-15 NOTE — Telephone Encounter (Signed)
 Copied from CRM [id number]. Topic: General - Other >> Oct 15, 2023  4:33 PM [name] wrote: Reason for CRM: Patient called she would like to tell [name] she lost  the number for the cpap machine and would like [name] to send it to her. Please call [phone]     Do you have the number for the cpap machine?

## 2023-10-21 NOTE — Procedures (Signed)
 Insufficient data.

## 2023-10-21 NOTE — Procedures (Signed)
 Per appt notes: needs to reschedule due to insufficient data.

## 2023-10-29 ENCOUNTER — Ambulatory Visit: Payer: Self-pay

## 2023-10-29 NOTE — Telephone Encounter (Signed)
 FYI Only or Action Required?: FYI only for provider.  Patient was last seen in primary care on 06/16/2023 by Antoniette [name] CROME, PA-C.  Called Nurse Triage reporting Ear Fullness.  Symptoms began yesterday.  Interventions attempted: Nothing.  Symptoms are: unchanged.  Triage Disposition: See Physician Within 24 Hours  Patient/caregiver understands and will follow disposition?: Yes  Copied from CRM #[id number]. Topic: Clinical - Red Word Triage >> Oct 29, 2023 12:18 PM Kevelyn M wrote: Red Word that prompted transfer to Nurse Triage: Pain is experience ear pain since the middle of the night. Has an appointment tomorrow but is asking for a sooner appointment or something she do until her appointment. Reason for Disposition  Earache  (Exceptions: Brief ear pain of lasting less than 60 minutes, or earache occurring during air travel.)  Answer Assessment - Initial Assessment Questions 1. LOCATION: Which ear is involved?     Right ear pain 2. ONSET: When did the ear pain start?      today 3. SEVERITY: How bad is the pain?  (Scale 1-10; mild, moderate or severe)     severe 4. URI SYMPTOMS: Do you have a runny nose or cough?     denies 5. FEVER: Do you have a fever? If Yes, ask: What is your temperature, how was it measured, and when did it start?     no 6. CAUSE: Have you been swimming recently?, How often do you use Q-TIPS?, Have you had any recent air travel or scuba diving?     swimming 7. OTHER SYMPTOMS: Do you have any other symptoms? (e.g., decreased hearing, dizziness, headache, stiff neck, vomiting)     denies 8. PREGNANCY: Is there any chance you are pregnant? When was your last menstrual period?     na  Protocols used: Rilla

## 2023-10-30 ENCOUNTER — Ambulatory Visit: Payer: Self-pay

## 2023-10-30 ENCOUNTER — Encounter: Payer: Self-pay | Admitting: Family Medicine

## 2023-10-30 ENCOUNTER — Ambulatory Visit (INDEPENDENT_AMBULATORY_CARE_PROVIDER_SITE_OTHER): Admitting: Family Medicine

## 2023-10-30 VITALS — BP 109/66 | HR 86 | Ht 63.0 in | Wt 129.6 lb

## 2023-10-30 DIAGNOSIS — J309 Allergic rhinitis, unspecified: Secondary | ICD-10-CM

## 2023-10-30 DIAGNOSIS — H6691 Otitis media, unspecified, right ear: Secondary | ICD-10-CM

## 2023-10-30 MED ORDER — FLUTICASONE PROPIONATE 50 MCG/ACT NA SUSP: 2.0000 | Freq: Every day | NASAL | 0 refills | Status: AC

## 2023-10-30 MED ORDER — TRAMADOL HCL 50 MG PO TABS: 50.0000 mg | ORAL_TABLET | Freq: Three times a day (TID) | ORAL | 0 refills | Status: AC | PRN

## 2023-10-30 MED ORDER — AMOXICILLIN-POT CLAVULANATE 875-125 MG PO TABS
1.0000 | ORAL_TABLET | Freq: Two times a day (BID) | ORAL | 0 refills | Status: DC
Start: 2023-10-30 — End: 2023-11-12

## 2023-10-30 NOTE — Telephone Encounter (Signed)
 Pt was seen today, dx with ear infection, states that the tramadol  she took at 2pm did not work, pt requesting stronger pain medications.  FYI Only or Action Required?: Action required by provider: medication refill request.  Patient was last seen in primary care on 10/30/2023 by Alvan Dorothyann BIRCH, MD.  Called Nurse Triage reporting Otalgia.  Symptoms began several days ago.  Interventions attempted: Prescription medications: tramadol  and abx.  Symptoms are: unchanged.  Triage Disposition: See HCP Within 4 Hours (Or PCP Triage)  Patient/caregiver understands and will follow disposition?: requesting pain medication  Copied from CRM #[id number]. Topic: Clinical - Red Word Triage >> Oct 30, 2023  5:06 PM Kevelyn M wrote: Red Word that prompted transfer to Nurse Triage: Patient was treated for a really bad ear infection earlier today and the Tramadol  is not working for the pain. Reason for Disposition  [1] SEVERE pain (e.g., excruciating) and [2] not improved 2 hours after pain medicine (e.g., acetaminophen  or ibuprofen )  Answer Assessment - Initial Assessment Questions 1. LOCATION: Which ear is involved?     R ear pain, dx with ear infection today 3. SEVERITY: How bad is the pain?  (Scale 1-10; mild, moderate or severe)     8 4. URI SYMPTOMS: Do you have a runny nose or cough?     denies 5. FEVER: Do you have a fever? If Yes, ask: What is your temperature, how was it measured, and when did it start?     denies 7. OTHER SYMPTOMS: Do you have any other symptoms? (e.g., decreased hearing, dizziness, headache, stiff neck, vomiting)     denies 8. PREGNANCY: Is there any chance you are pregnant? When was your last menstrual period?     denies  Protocols used: Rilla

## 2023-10-30 NOTE — Progress Notes (Signed)
 Acute Office Visit  Subjective:     Patient ID: Melissa Sharp, female    DOB: 1980-05-28, 43 y.o.   MRN: [id number]  Chief Complaint  Patient presents with   Ear Fullness         HPI Patient is in today for right ear pain & fullness.  Started 2 days ago.  Today woke up with a little congestion and some left ear discomfort.  No drainage from the ear. No fever.  Hurt to lay on that side of her head last night. Painful to open her jawa  ROS      Objective:    BP 109/66   Pulse 86   Ht 5' 3 (1.6 m)   Wt 129 lb 9.6 oz (58.8 kg)   SpO2 100%   BMI 22.96 kg/m    Physical Exam Constitutional:      Appearance: Normal appearance.  HENT:     Head: Normocephalic and atraumatic.     Right Ear: External ear normal. There is no impacted cerumen.     Left Ear: Tympanic membrane, ear canal and external ear normal. There is no impacted cerumen.     Ears:     Comments: Right TM is thick white and opaque.  I am unable to visualize the ossicle.  The canal on that side is erythematous as well    Nose: Nose normal.     Mouth/Throat:     Pharynx: Oropharynx is clear.  Eyes:     Conjunctiva/sclera: Conjunctivae normal.  Cardiovascular:     Rate and Rhythm: Normal rate and regular rhythm.  Pulmonary:     Effort: Pulmonary effort is normal.     Breath sounds: Normal breath sounds.  Musculoskeletal:     Cervical back: Neck supple. No tenderness.  Lymphadenopathy:     Cervical: No cervical adenopathy.  Skin:    General: Skin is warm and dry.  Neurological:     Mental Status: She is alert and oriented to person, place, and time.  Psychiatric:        Mood and Affect: Mood normal.     No results found for any visits on 10/30/23.      Assessment & Plan:   Problem List Items Addressed This Visit   None Visit Diagnoses       Right otitis media, unspecified otitis media type    -  Primary   Relevant Medications   amoxicillin -clavulanate (AUGMENTIN ) 875-125 MG tablet      Allergic sinusitis       Relevant Medications   amoxicillin -clavulanate (AUGMENTIN ) 875-125 MG tablet   fluticasone  (FLONASE ) 50 MCG/ACT nasal spray      Treat for otitis media-the eardrum itself is not erythematous but it is thick and opaque tympanometry showed flattened curve.  The canal itself is erythematous but I do not see any debris to indicate otitis externa.  Will treat with Augmentin .  Did encourage her to go ahead and restart Flonase  which she has used in the past.  She is already been using high dose Advil  and not all without significant relief so given a short supply of tramadol  to use as needed  Meds ordered this encounter  Medications   amoxicillin -clavulanate (AUGMENTIN ) 875-125 MG tablet    Sig: Take 1 tablet by mouth 2 (two) times daily.    Dispense:  14 tablet    Refill:  0   fluticasone  (FLONASE ) 50 MCG/ACT nasal spray    Sig: Place 2 sprays into both nostrils  daily.    Dispense:  16 g    Refill:  0   traMADol  (ULTRAM ) 50 MG tablet    Sig: Take 1 tablet (50 mg total) by mouth every 8 (eight) hours as needed for up to 5 days.    Dispense:  10 tablet    Refill:  0    No follow-ups on file.  Dorothyann Byars, MD

## 2023-10-31 ENCOUNTER — Other Ambulatory Visit: Payer: Self-pay | Admitting: Physician Assistant

## 2023-10-31 ENCOUNTER — Ambulatory Visit: Payer: Self-pay

## 2023-10-31 MED ORDER — HYDROCODONE-ACETAMINOPHEN 5-325 MG PO TABS: 1.0000 | ORAL_TABLET | Freq: Three times a day (TID) | ORAL | 0 refills | Status: AC | PRN

## 2023-10-31 NOTE — Progress Notes (Signed)
 Pt called and stated Tramadol  not working for pain. 7 tablets of norco sent to the pharmacy.

## 2023-10-31 NOTE — Telephone Encounter (Signed)
 [name] sent in different pain medication.

## 2023-10-31 NOTE — Telephone Encounter (Signed)
 FYI Only or Action Required?: Action required by provider: clinical question for provider.  Patient was last seen in primary care on 10/30/2023 by Alvan Dorothyann BIRCH, MD.  Called Nurse Triage reporting Otalgia.  Symptoms began yesterday.  Interventions attempted: Prescription medications: tramadol .  Symptoms are: unchanged.  Triage Disposition: See Physician Within 24 Hours  Patient/caregiver understands and will follow disposition?: UnsureCopied from CRM #[id number]. Topic: Clinical - Red Word Triage >> Oct 31, 2023 10:42 AM Marda MATSU wrote: Red Word that prompted transfer to Nurse Triage: ear pain, never received a call back from triage yesterday patient stated. Reason for Disposition  Earache  (Exceptions: Brief ear pain of lasting less than 60 minutes, or earache occurring during air travel.)  Answer Assessment - Initial Assessment Questions Pt still in pain from appt yesterday. Tramadol  didn't work. Pt is asking for stronger pain medication. Pt called yesterday evening but hasn't heard back from office. No appt made at this time. Please advise.        1. LOCATION: Which ear is involved?     Right ear 2. ONSET: When did the ear pain start?      3 days 3. SEVERITY: How bad is the pain?  (Scale 1-10; mild, moderate or severe)     8 4. URI SYMPTOMS: Do you have a runny nose or cough?     no 5. FEVER: Do you have a fever? If Yes, ask: What is your temperature, how was it measured, and when did it start?     no 6. CAUSE: Have you been swimming recently?, How often do you use Q-TIPS?, Have you had any recent air travel or scuba diving?     Dx ear infection  7. OTHER SYMPTOMS: Do you have any other symptoms? (e.g., decreased hearing, dizziness, headache, stiff neck, vomiting)     denies  Protocols used: Earache-A-AH

## 2023-11-03 ENCOUNTER — Telehealth: Payer: Self-pay

## 2023-11-03 ENCOUNTER — Other Ambulatory Visit: Payer: Self-pay | Admitting: Physician Assistant

## 2023-11-03 DIAGNOSIS — Z1231 Encounter for screening mammogram for malignant neoplasm of breast: Secondary | ICD-10-CM

## 2023-11-03 NOTE — Telephone Encounter (Signed)
 Copied from CRM #[id number]. Topic: Clinical - Order For Equipment >> Nov 03, 2023  2:14 PM Kevelyn M wrote: Reason for CRM: patient calling in to get the number to order CPAP machine. Please confirm where patient order for CPAP was sent and call patient back with information so she can get services set up. Call back# [phone].

## 2023-11-03 NOTE — Telephone Encounter (Signed)
 Melissa Sharp,   Please see patients message below.

## 2023-11-04 NOTE — Telephone Encounter (Signed)
 Can you tell where CPAP was sent? Study is in media tab. If she does not have machine could resend to another company.

## 2023-11-05 ENCOUNTER — Other Ambulatory Visit: Payer: Self-pay | Admitting: Physician Assistant

## 2023-11-06 ENCOUNTER — Ambulatory Visit: Payer: Self-pay | Admitting: *Deleted

## 2023-11-06 NOTE — Telephone Encounter (Signed)
 FYI Only or Action Required?: Action required by provider: clinical question for provider and update on patient condition.  Patient was last seen in primary care on 10/30/2023 by Alvan Dorothyann BIRCH, MD.  Called Nurse Triage reporting Hearing Problem.  Symptoms began a week ago.  Interventions attempted: Prescription medications: antibiotics.  Symptoms are: unchanged.  Triage Disposition: See Physician Within 24 Hours  Patient/caregiver understands and will follow disposition?: No, wishes to speak with PCP  Please advise if another appt needed or if medication needed.         Copied from CRM #[id number]. Topic: Clinical - Red Word Triage >> Nov 06, 2023 11:21 AM Melissa Sharp wrote: Red Word that prompted transfer to Nurse Triage: Patient states she saw Dr. Alvan last week about a ear infection. States she was told it should be cleared by this week but patient states she still can barely hear out of her right ear. Not sure if she's needing to come back to be seen. Reason for Disposition  [1] Decreased hearing AND [2] taking medication that can damage hearing (e.g., gentamycin, tobramycin, furosemide, ethacrynic acid, cisplatin, quinidine)  Answer Assessment - Initial Assessment Questions Calling to report hearing slightly better but still having issues with not hearing out of right ear, comes and goes. No pain reported. Please advise if another OV needed. Please advise if additional medication needed or OTC medication to try.  Requesting if PCP can f/u of CPAP order. Please call patient for recommendations due to not being able to get in to my chart account. Recommended if worsening sx call back.      1. DESCRIPTION: What type of hearing problem are you having? Describe it for me. (e.g., complete hearing loss, partial loss)     Ear infection took last antibiotic today  2. LOCATION: One or both ears? If one, ask: Which ear?     Right ear  3. SEVERITY: Can you hear  anything? If Yes, ask: What can you hear? (e.g., ticking watch, whisper, talking)     Being clogged to not hearing  4. ONSET: When did this begin? Did it start suddenly or come on gradually?     Over 1 week  5. PATTERN: Does this come and go, or has it been constant since it started?     Comes and goes , and still clogged  6. PAIN: Is there any pain in your ear(s)?  (Scale 0-10; or none, mild, moderate, severe)     No  7. CAUSE: What do you think is causing this hearing problem?     Ear infection  8. OTHER SYMPTOMS: Do you have any other symptoms? (e.g., dizziness, ringing in ears)     Hearing comes and goes  some dizziness  9. PREGNANCY: Is there any chance you are pregnant? When was your last menstrual period?     na  Protocols used: Hearing Loss or Change-A-AH

## 2023-11-07 ENCOUNTER — Other Ambulatory Visit: Payer: Self-pay | Admitting: Physician Assistant

## 2023-11-07 ENCOUNTER — Other Ambulatory Visit: Payer: Self-pay

## 2023-11-07 DIAGNOSIS — G4733 Obstructive sleep apnea (adult) (pediatric): Secondary | ICD-10-CM

## 2023-11-07 NOTE — Telephone Encounter (Signed)
 I would rec referr to ENT at this point.  She if ok with her and place refe

## 2023-11-07 NOTE — Progress Notes (Signed)
 IMPRESSIONS - Mild obstructive sleep apnea occurred during this study (AHI = 7.1/h). - No significant central sleep apnea occurred during this study (CAI = 1.8/h). - Oxygen desaturation was noted during this study (Min O2 = 75.0%, Mean 95.1%). - The patient snored with moderate snoring volume. - No cardiac abnormalities were noted during this study. - Clinically significant periodic limb movements did not occur during sleep. No significant associated arousals. - Sleep architecture fragmented by frequent brief arousals and sleep stage changes, bathroom x 1.   DIAGNOSIS - Obstructive Sleep Apnea (G47.33)   RECOMMENDATIONS - Treatment for very mild OSA is guided by symptoms and co-morbidity. Conservative measures may include observation, weight loss and sleep position off back. Other options, including CPAP, a fitted oral appliance or ENT evaluation, would be based on clinical judgment. - Sleep hygiene should be reviewed to assess factors that may improve sleep quality. - Weight management and regular exercise should be initiated or continued if appropriate.   Printed CPAP order today.

## 2023-11-07 NOTE — Telephone Encounter (Signed)
 Done

## 2023-11-07 NOTE — Telephone Encounter (Signed)
 Patient states her hearing is getting better. She will call back if she wants the referral to ENT.

## 2023-11-07 NOTE — Addendum Note (Signed)
 Addended by: BONNY JON DEL on: 11/07/2023 09:20 AM   Modules accepted: Orders

## 2023-11-10 ENCOUNTER — Ambulatory Visit: Payer: Self-pay | Admitting: *Deleted

## 2023-11-10 NOTE — Telephone Encounter (Signed)
 Copied from CRM [id number]. Topic: Clinical - Red Word Triage >> Nov 10, 2023  8:12 AM Merlynn LABOR wrote: Red Word that prompted transfer to Nurse Triage: Right ear pain, Left big toe pain Reason for Disposition  Earache  (Exceptions: Brief ear pain of lasting less than 60 minutes, or earache occurring during air travel.)  Answer Assessment - Initial Assessment Questions 1. LOCATION: Which ear is involved?     I'm having right ear pain and pain in my left big toe.   I've been seen for my ear.   She wants to refer me to an ENT.    The toe is new.   It gets stuck and pops when I'm walking.  My left big toe.  It's not red or swollen or oozing. 2. ONSET: When did the ear pain start?      It has not gotten better my ear.  I have an appt for 11/11/2023 I want to be seen today. 3. SEVERITY: How bad is the pain?  (Scale 1-10; mild, moderate or severe)     Bad 4. URI SYMPTOMS: Do you have a runny nose or cough?     No 5. FEVER: Do you have a fever? If Yes, ask: What is your temperature, how was it measured, and when did it start?     No 6. CAUSE: Have you been swimming recently?, How often do you use Q-TIPS?, Have you had any recent air travel or scuba diving?     Ear infection 7. OTHER SYMPTOMS: Do you have any other symptoms? (e.g., decreased hearing, dizziness, headache, stiff neck, vomiting)     Toe is sore and pops 8. PREGNANCY: Is there any chance you are pregnant? When was your last menstrual period?     Not asked  Protocols used: Earache-A-AH FYI Only or Action Required?: FYI only for provider.  Patient was last seen in primary care on 10/30/2023 by Alvan Dorothyann BIRCH, MD.  Called Nurse Triage reporting Otalgia. Continuing pain in right ear.     Symptoms began several weeks ago.  Interventions attempted: Prescription medications: medication prescribed during last visit for right ear pain.   Toe pain is a new issue.  Symptoms are: gradually worsening.  Triage  Disposition: See Physician Within 24 Hours  Patient/caregiver understands and will follow disposition?: Yes

## 2023-11-10 NOTE — Telephone Encounter (Signed)
 Appointment scheduled for tomorrow.

## 2023-11-11 ENCOUNTER — Ambulatory Visit: Admitting: Medical-Surgical

## 2023-11-11 ENCOUNTER — Ambulatory Visit: Payer: Self-pay

## 2023-11-11 ENCOUNTER — Ambulatory Visit: Admitting: Physician Assistant

## 2023-11-11 NOTE — Telephone Encounter (Signed)
  FYI Only or Action Required?: FYI only for provider.  Patient was last seen in primary care on 10/30/2023 by Alvan Dorothyann BIRCH, MD.  Called Nurse Triage reporting Reschedule.  Symptoms began several days ago.  Interventions attempted: Other: na.  Symptoms are: unchanged.  Triage Disposition: Information or Advice Only Call  Patient/caregiver understands and will follow disposition?:   Copied from CRM #[id number]. Topic: Clinical - Red Word Triage >> Nov 11, 2023  9:41 AM Mercer PEDLAR wrote: Red Word that prompted transfer to Nurse Triage: Patient is calling because her appointment for today 11/11/23 was canceled and she is having hear pain, can't hear from her ear, and toe pain. Reason for Disposition  [1] Follow-up call to recent contact AND [2] information only call, no triage required  Answer Assessment - Initial Assessment Questions Reviewed schedule no acute visits are available today, patient verbalized frustration and states she will need to find a new provider if unable to secure a sick visit. This Clinical research associate consulted with [facility], was informed acute visit cancelled today due to provider being out of office, Lorenza spoke with pcp who was willing to work patient in at 2:20 today, patient declines work in time due to other commitment. Schedule acute visit with alternate provider on 11/12/23    1. REASON FOR CALL: What is the main reason for your call? or How can I best help you?     Reschedule acute appointment that was cancelled by provider today 2. SYMPTOMS : Do you have any symptoms?      Ear and toe pain, see nurse triage encounter 11/10/23 3. OTHER QUESTIONS: Do you have any other questions?     Why can't I ever have an appointment when I am sick?  Protocols used: Information Only Call - No Triage-A-AH

## 2023-11-12 ENCOUNTER — Ambulatory Visit (INDEPENDENT_AMBULATORY_CARE_PROVIDER_SITE_OTHER): Admitting: Family Medicine

## 2023-11-12 ENCOUNTER — Encounter: Payer: Self-pay | Admitting: Family Medicine

## 2023-11-12 ENCOUNTER — Ambulatory Visit (INDEPENDENT_AMBULATORY_CARE_PROVIDER_SITE_OTHER)

## 2023-11-12 VITALS — BP 111/67 | HR 85 | Ht 63.0 in | Wt 130.0 lb

## 2023-11-12 DIAGNOSIS — M79675 Pain in left toe(s): Secondary | ICD-10-CM | POA: Insufficient documentation

## 2023-11-12 DIAGNOSIS — H6991 Unspecified Eustachian tube disorder, right ear: Secondary | ICD-10-CM | POA: Insufficient documentation

## 2023-11-12 MED ORDER — CEFDINIR 300 MG PO CAPS: 300.0000 mg | ORAL_CAPSULE | Freq: Two times a day (BID) | ORAL | 0 refills | Status: DC

## 2023-11-12 NOTE — Telephone Encounter (Signed)
 Patient seen today by Dr. Augustus Ledger

## 2023-11-12 NOTE — Progress Notes (Signed)
 Melissa Sharp - 43 y.o. female MRN [id number]  Date of birth: [birth year]  Subjective Chief Complaint  Patient presents with   Toe Pain   Ear Pain    HPI Melissa Sharp is a 43 y.o. female here today with complaint of pain in the great toe the left foot.    Feels like her great toe locks up at times.  She had xrays in 2023.  These were read as normal.  She denies any swelling of the toe or recent injury.  She has also had some pain in the right ear.  She had otitis media a couple weeks ago and treated with Augmentin .  She reports that pain only recently improved a couple of days ago.  She still gets occasional sharp pain and still has some pressure.  This is relieved with plugging her nose and blowing.  She has not had fever or chills.  She denies drainage from the ear.  ROS:  A comprehensive ROS was completed and negative except as noted per HPI  Allergies  Allergen Reactions   Codeine  Itching   Labetalol      Numbness and tingling/jerking/aphasia   Latex Hives and Itching   Hydrocodone  Itching   Lipitor [Atorvastatin ]     Hair loss   Nifedipine     Swelling/bloating    Past Medical History:  Diagnosis Date   Anxiety    Chalazion left upper eyelid 08/26/2018   Depression    Hypothyroidism    Migraine    PIH (pregnancy induced hypertension) 07/28/2015   Pneumonia due to COVID-19 virus 10/25/2020   Preeclampsia    Sinobronchitis 09/24/2019   Thyroid  activity decreased 10/27/2013    Past Surgical History:  Procedure Laterality Date   TONSILECTOMY/ADENOIDECTOMY WITH MYRINGOTOMY      Social History   Socioeconomic History   Marital status: Married    Spouse name: Fonda   Number of children: 0   Years of education: Not on file   Highest education level: Not on file  Occupational History   Occupation: Multimedia programmer [employer]  Tobacco Use   Smoking status: Never   Smokeless tobacco: Never  Substance and Sexual Activity   Alcohol use: Yes    Comment: 5 drinks a week    Drug use: No   Sexual activity: Yes    Birth control/protection: None  Other Topics Concern   Not on file  Social History Narrative   Lives w/ husband   Social Drivers of Health   Financial Resource Strain: Low Risk  (11/07/2021)   Received from [facility]   Overall Financial Resource Strain (CARDIA)    Difficulty of Paying Living Expenses: Not hard at all  Food Insecurity: No Food Insecurity (07/06/2021)   Received from [facility]   Hunger Vital Sign    Within the past 12 months, you worried that your food would run out before you got the money to buy more.: Never true    Within the past 12 months, the food you bought just didn't last and you didn't have money to get more.: Never true  Transportation Needs: Not on file  Physical Activity: Insufficiently Active (07/06/2021)   Received from [facility]   Exercise Vital Sign    On average, how many days per week do you engage in moderate to strenuous exercise (like a brisk walk)?: 3 days    On average, how many minutes do you engage in exercise at this level?: 30 min  Stress: No Stress Concern Present (11/07/2021)  Received from [facility] - Occupational Stress Questionnaire    Feeling of Stress : Not at all  Social Connections: Unknown (07/28/2022)   Received from [facility]   Social Network    Social Network: Not on file    Family History  Problem Relation Age of Onset   Atrial fibrillation Mother    Alcohol abuse Father    Pancreatic cancer Father    Hyperlipidemia Other    Migraines Neg Hx     Health Maintenance  Topic Date Due   Medicare Annual Wellness (AWV)  Never done   Hepatitis C Screening  Never done   Pneumococcal Vaccine (1 of 2 - PCV) Never done   Hepatitis B Vaccines 19-59 Average Risk (1 of 3 - 19+ 3-dose series) Never done   COVID-19 Vaccine (3 - Pfizer risk series) 01/27/2020   DTaP/Tdap/Td (2 - Td or Tdap) 10/28/2023   INFLUENZA VACCINE   10/17/2023   MAMMOGRAM  11/28/2023   Cervical Cancer Screening (HPV/Pap Cotest)  04/30/2024   HPV VACCINES  Completed   HIV Screening  Completed   Meningococcal B Vaccine  Aged Out     ----------------------------------------------------------------------------------------------------------------------------------------------------------------------------------------------------------------- Physical Exam BP 111/67 (BP Location: Left Arm, Patient Position: Sitting, Cuff Size: Small)   Pulse 85   Ht 5' 3 (1.6 m)   Wt 130 lb (59 kg)   SpO2 100%   BMI 23.03 kg/m   Physical Exam Constitutional:      Appearance: Normal appearance.  HENT:     Ears:     Comments: Right TM with serous effusion.  No significant erythema noted. Eyes:     General: No scleral icterus. Cardiovascular:     Rate and Rhythm: Normal rate and regular rhythm.  Pulmonary:     Effort: Pulmonary effort is normal.     Breath sounds: Normal breath sounds.  Neurological:     Mental Status: She is alert.  Psychiatric:        Mood and Affect: Mood normal.        Behavior: Behavior normal.     ------------------------------------------------------------------------------------------------------------------------------------------------------------------------------------------------------------------- Assessment and Plan  Eustachian tube dysfunction, right Infection appears to have resolved however continues to have middle ear effusion.  Recommend continued use of Flonase .  She can use Sudafed for few days as well but I encouraged her to keep an eye on her blood pressure.  She is concerned about reinfection sent over cefdinir  to start if pain redevelops.  Pain of toe of left foot Updated x-rays of the foot ordered.  Referral placed to podiatry.   Meds ordered this encounter  Medications   cefdinir  (OMNICEF ) 300 MG capsule    Sig: Take 1 capsule (300 mg total) by mouth 2 (two) times daily.    Dispense:  20  capsule    Refill:  0    No follow-ups on file.

## 2023-11-12 NOTE — Assessment & Plan Note (Signed)
 Updated x-rays of the foot ordered.  Referral placed to podiatry.

## 2023-11-12 NOTE — Assessment & Plan Note (Signed)
 Infection appears to have resolved however continues to have middle ear effusion.  Recommend continued use of Flonase .  She can use Sudafed for few days as well but I encouraged her to keep an eye on her blood pressure.  She is concerned about reinfection sent over cefdinir  to start if pain redevelops.

## 2023-11-12 NOTE — Patient Instructions (Addendum)
 Continue nasal spray.  Try sudafed for a few days  Start cefdinir  if pain returns.

## 2023-11-18 ENCOUNTER — Encounter: Payer: Self-pay | Admitting: Sports Medicine

## 2023-11-19 ENCOUNTER — Telehealth: Payer: Self-pay

## 2023-11-19 NOTE — Telephone Encounter (Signed)
 Copied from CRM #[id number]. Topic: Clinical - Lab/Test Results >> Nov 19, 2023  9:33 AM Susanna ORN wrote: Reason for CRM: Patient calling to get foot x-ray results that she had completed almost a week or two ago. Please give patient a call back. CB #: [phone].

## 2023-11-19 NOTE — Telephone Encounter (Signed)
 Patient sent my chart message that results have not been received yet at our office and we did request to move this up for reading . Once resulted we would reach out to her with the results after reviewed by provider.

## 2023-11-20 ENCOUNTER — Ambulatory Visit: Admitting: Podiatry

## 2023-11-23 ENCOUNTER — Ambulatory Visit: Payer: Self-pay | Admitting: Family Medicine

## 2023-11-27 ENCOUNTER — Encounter: Payer: Self-pay | Admitting: Podiatry

## 2023-11-27 ENCOUNTER — Ambulatory Visit: Admitting: Podiatry

## 2023-11-27 DIAGNOSIS — M205X2 Other deformities of toe(s) (acquired), left foot: Secondary | ICD-10-CM | POA: Diagnosis not present

## 2023-11-27 MED ORDER — DEXAMETHASONE SODIUM PHOSPHATE 120 MG/30ML IJ SOLN
4.0000 mg | Freq: Once | INTRAMUSCULAR | Status: AC
  Administered 2023-11-27: 4 mg via INTRA_ARTICULAR

## 2023-11-27 MED ORDER — MELOXICAM 15 MG PO TABS: 15.0000 mg | ORAL_TABLET | Freq: Every day | ORAL | 0 refills | Status: DC

## 2023-11-27 MED ORDER — TRIAMCINOLONE ACETONIDE 10 MG/ML IJ SUSP
2.5000 mg | Freq: Once | INTRAMUSCULAR | Status: AC
  Administered 2023-11-27: 2.5 mg via INTRA_ARTICULAR

## 2023-11-27 NOTE — Progress Notes (Signed)
  Subjective:  Patient ID: Melissa Sharp, female    DOB: Aug 04, 1980,   MRN: [id number]  Chief Complaint  Patient presents with   Toe Pain    My left big toe has been bothering me.  It's starting to curve in a little bit.  The doctor said I have trigger toe.    43 y.o. female presents for concern of left great toe that has been bothersome for some time now. Concern for the toe curving in. She relates painful with activity and pain will come and go and worse with more activity. Denies much treatment  . Denies any other pedal complaints. Denies n/v/f/c.   Past Medical History:  Diagnosis Date   Anxiety    Chalazion left upper eyelid 08/26/2018   Depression    Hypothyroidism    Migraine    PIH (pregnancy induced hypertension) 07/28/2015   Pneumonia due to COVID-19 virus 10/25/2020   Preeclampsia    Sinobronchitis 09/24/2019   Thyroid  activity decreased 10/27/2013    Objective:  Physical Exam: Vascular: DP/PT pulses 2/4 bilateral. CFT <3 seconds. Normal hair growth on digits. No edema.  Skin. No lacerations or abrasions bilateral feet.  Musculoskeletal: MMT 5/5 bilateral lower extremities in DF, PF, Inversion and Eversion. Deceased ROM in DF of ankle joint. Mildly tender over dorsum of first MPJ and to plantar MPJ. Pain with end range DF and PF. No pain elsewhere on the foot.  Neurological: Sensation intact to light touch.   Assessment:   1. Hallux limitus, left      Plan:  Patient was evaluated and treated and all questions answered. -Xrays reviewed. Degenerative changed noted to first MPJ with decreased of joint space and some cystic changes.  Reviewed previous noted.  -Discussed hallux limitus and  treatement options; conservative and  Surgical management; risks, benefits, alternatives discussed. All patient's questions answered. -Rx Meloxicam  provided. -Patient opted for injection of first MTPJ. After verbal consent, injected into left 1st MTPJ for symptomatic relief 1cc  marcaine  plain, 1cc dexamethasone  without complication.   -Recommend continue with good supportive shoes and inserts. Discussed stiff soled shoes and use of carbon fiber foot plate.   -Patient to return to office as needed or sooner if condition worsens.   Asberry Failing, DPM

## 2023-12-03 ENCOUNTER — Telehealth: Payer: Self-pay | Admitting: Podiatry

## 2023-12-03 NOTE — Telephone Encounter (Signed)
 Patient called wanting to know when she should have the orthoscopic surgery? She says this was discussed at her office visit on 11/27/2023. Thanks

## 2023-12-04 ENCOUNTER — Telehealth: Payer: Self-pay | Admitting: Podiatry

## 2023-12-04 NOTE — Telephone Encounter (Signed)
 Pt called and left message wanting a quote for surgery.She was given the codes.  I returned call and pt gave me the codes and I am forwarding them to our billing and to surgery center to get a estimate. She said thank you so much that I was going above and beyond and she appreciates it.   Can you call pt with a estimate for cpt [id number] and cpt [id number] please

## 2023-12-05 ENCOUNTER — Telehealth: Payer: Self-pay | Admitting: Podiatry

## 2023-12-05 NOTE — Telephone Encounter (Signed)
 Pt left message stating she was calling to set up surgery.  I returned call and pt needs to see Dr Sikora for a surgical consult before we can set up the surgery. Pt is a [place] pt.

## 2023-12-09 ENCOUNTER — Other Ambulatory Visit: Payer: Self-pay | Admitting: Physician Assistant

## 2023-12-11 ENCOUNTER — Ambulatory Visit: Admitting: Urgent Care

## 2023-12-15 ENCOUNTER — Ambulatory Visit: Admitting: Physician Assistant

## 2023-12-18 ENCOUNTER — Ambulatory Visit: Admitting: Podiatry

## 2023-12-25 ENCOUNTER — Other Ambulatory Visit: Payer: Self-pay | Admitting: Physician Assistant

## 2023-12-25 DIAGNOSIS — J309 Allergic rhinitis, unspecified: Secondary | ICD-10-CM

## 2023-12-25 DIAGNOSIS — J302 Other seasonal allergic rhinitis: Secondary | ICD-10-CM

## 2023-12-25 DIAGNOSIS — R051 Acute cough: Secondary | ICD-10-CM

## 2023-12-31 ENCOUNTER — Ambulatory Visit (INDEPENDENT_AMBULATORY_CARE_PROVIDER_SITE_OTHER)

## 2023-12-31 ENCOUNTER — Ambulatory Visit

## 2023-12-31 VITALS — Ht 64.0 in | Wt 125.0 lb

## 2023-12-31 DIAGNOSIS — Z0000 Encounter for general adult medical examination without abnormal findings: Secondary | ICD-10-CM

## 2023-12-31 DIAGNOSIS — Z1231 Encounter for screening mammogram for malignant neoplasm of breast: Secondary | ICD-10-CM | POA: Diagnosis not present

## 2023-12-31 NOTE — Progress Notes (Signed)
 Subjective:   Melissa Sharp is a 43 y.o. female who presents for Medicare Annual (Subsequent) preventive examination.  Visit Complete: Virtual I connected with  Melissa Sharp on 12/31/23 by a audio enabled telemedicine application and verified that I am speaking with the correct person using two identifiers.  Patient Location: Home  Provider Location: Office/Clinic  I discussed the limitations of evaluation and management by telemedicine. The patient expressed understanding and agreed to proceed.  Vital Signs: Because this visit was a virtual/telehealth visit, some criteria may be missing or patient reported. Any vitals not documented were not able to be obtained and vitals that have been documented are patient reported.  Patient Medicare AWV questionnaire was completed by the patient on n/a; I have confirmed that all information answered by patient is correct and no changes since this date.  Cardiac Risk Factors include: hypertension     Objective:    Today's Vitals   12/31/23 1056  Weight: 125 lb (56.7 kg)  Height: 5' 4 (1.626 m)   Body mass index is 21.46 kg/m.     12/31/2023   11:06 AM 01/19/2023    8:00 PM 10/16/2016   11:15 AM 07/28/2015    1:34 PM 07/28/2015    1:02 PM 07/21/2015    5:36 PM 10/27/2013    9:41 AM  Advanced Directives  Does Patient Have a Medical Advance Directive? No No No  No  No  No  Patient does not have advance directive;Patient would like information   Would patient like information on creating a medical advance directive? No - Patient declined Yes (MAU/Ambulatory/Procedural Areas - Information given) No - Patient declined  No - patient declined information  No - patient declined information  No - patient declined information  Advance directive brochure given (Outpatient ONLY)      Data saved with a previous flowsheet row definition    Current Medications (verified) Outpatient Encounter Medications as of 12/31/2023  Medication Sig   albuterol   (VENTOLIN  HFA) 108 (90 Base) MCG/ACT inhaler Inhale 2 puffs into the lungs every 6 (six) hours as needed for wheezing.   Atogepant  (QULIPTA ) 10 MG TABS Take one tablet by mouth daily for migraine prevention.   buPROPion  (WELLBUTRIN  XL) 150 MG 24 hr tablet Take 150 mg by mouth daily.   cariprazine (VRAYLAR) 1.5 MG capsule Take 3 mg by mouth daily.   cyanocobalamin  (VITAMIN B12) 1000 MCG tablet Take 1 tablet (1,000 mcg total) by mouth daily.   dapagliflozin  propanediol (FARXIGA ) 10 MG TABS tablet Take 1 tablet (10 mg total) by mouth daily.   fenofibrate  (TRICOR ) 145 MG tablet Take 1 tablet (145 mg total) by mouth daily. Needs appointment and labs.   fluticasone  (FLONASE ) 50 MCG/ACT nasal spray Place 2 sprays into both nostrils daily.   hydrochlorothiazide  (HYDRODIURIL ) 25 MG tablet Take 1 tablet (25 mg total) by mouth daily.   hydrOXYzine (VISTARIL) 25 MG capsule Take 25-50 mg by mouth at bedtime.   ipratropium (ATROVENT ) 0.06 % nasal spray Place 2 sprays into both nostrils 4 (four) times daily.   levothyroxine  (SYNTHROID ) 75 MCG tablet Take 1 tablet (75 mcg total) by mouth daily before breakfast.   montelukast  (SINGULAIR ) 10 MG tablet Take 1 tablet (10 mg total) by mouth daily.   Rimegepant Sulfate (NURTEC) 75 MG TBDP Take 1 tablet (75 mg total) by mouth daily as needed.   rizatriptan  (MAXALT -MLT) 10 MG disintegrating tablet Take 1 tablet (10 mg total) by mouth as needed for migraine. May repeat in  2 hours if needed   rosuvastatin  (CRESTOR ) 20 MG tablet Take 1 tablet (20 mg total) by mouth daily.   spironolactone  (ALDACTONE ) 25 MG tablet Take 1 tablet (25 mg total) by mouth daily.   traZODone (DESYREL) 100 MG tablet Take 100 mg by mouth at bedtime as needed.   dicyclomine  (BENTYL ) 10 MG capsule Take 1 capsule (10 mg total) by mouth 2 (two) times daily. (Patient not taking: Reported on 12/31/2023)   meloxicam  (MOBIC ) 15 MG tablet Take 1 tablet (15 mg total) by mouth daily.   [DISCONTINUED] cefdinir   (OMNICEF ) 300 MG capsule Take 1 capsule (300 mg total) by mouth 2 (two) times daily.   No facility-administered encounter medications on file as of 12/31/2023.    Allergies (verified) Codeine , Labetalol , Latex, Hydrocodone , Lipitor Eiran.Duster ], and Nifedipine   History: Past Medical History:  Diagnosis Date   Anxiety    Chalazion left upper eyelid 08/26/2018   Depression    Hypothyroidism    Migraine    PIH (pregnancy induced hypertension) 07/28/2015   Pneumonia due to COVID-19 virus 10/25/2020   Preeclampsia    Sinobronchitis 09/24/2019   Thyroid  activity decreased 10/27/2013   Past Surgical History:  Procedure Laterality Date   TONSILECTOMY/ADENOIDECTOMY WITH MYRINGOTOMY     Family History  Problem Relation Age of Onset   Atrial fibrillation Mother    Alcohol abuse Father    Pancreatic cancer Father    Hyperlipidemia Other    Migraines Neg Hx    Social History   Socioeconomic History   Marital status: Married    Spouse name: Fonda   Number of children: 0   Years of education: Not on file   Highest education level: Not on file  Occupational History   Occupation: Multimedia programmer [employer]  Tobacco Use   Smoking status: Never   Smokeless tobacco: Never  Substance and Sexual Activity   Alcohol use: Yes    Comment: 5 drinks a week - occasionally   Drug use: No   Sexual activity: Yes    Birth control/protection: None  Other Topics Concern   Not on file  Social History Narrative   Lives w/ husband and 2 children.    Social Drivers of Corporate investment banker Strain: Low Risk  (12/31/2023)   Overall Financial Resource Strain (CARDIA)    Difficulty of Paying Living Expenses: Not hard at all  Food Insecurity: No Food Insecurity (12/31/2023)   Hunger Vital Sign    Worried About Running Out of Food in the Last Year: Never true    Ran Out of Food in the Last Year: Never true  Transportation Needs: No Transportation Needs (12/31/2023)   PRAPARE - Therapist, art (Medical): No    Lack of Transportation (Non-Medical): No  Physical Activity: Insufficiently Active (12/31/2023)   Exercise Vital Sign    Days of Exercise per Week: 2 days    Minutes of Exercise per Session: 50 min  Stress: No Stress Concern Present (12/31/2023)   [employer] of Occupational Health - Occupational Stress Questionnaire    Feeling of Stress: Not at all  Social Connections: Moderately Integrated (12/31/2023)   Social Connection and Isolation Panel    Frequency of Communication with Friends and Family: More than three times a week    Frequency of Social Gatherings with Friends and Family: Once a week    Attends Religious Services: More than 4 times per year    Active Member of [employer] or Organizations:  No    Attends Banker Meetings: Never    Marital Status: Married    Tobacco Counseling Counseling given: Not Answered   Clinical Intake:  Pre-visit preparation completed: Yes  Pain : No/denies pain     BMI - recorded: 21.46 Nutritional Status: BMI of 19-24  Normal Nutritional Risks: None Diabetes: No  How often do you need to have someone help you when you read instructions, pamphlets, or other written materials from your doctor or pharmacy?: 1 - Never What is the last grade level you completed in school?: 14  Interpreter Needed?: No      Activities of Daily Living    12/31/2023   10:58 AM  In your present state of health, do you have any difficulty performing the following activities:  Hearing? 0  Vision? 0  Difficulty concentrating or making decisions? 1  Walking or climbing stairs? 0  Dressing or bathing? 0  Doing errands, shopping? 0  Preparing Food and eating ? N  Using the Toilet? N  In the past six months, have you accidently leaked urine? N  Do you have problems with loss of bowel control? N  Managing your Medications? N  Managing your Finances? N  Housekeeping or managing your Housekeeping? N     Patient Care Team: Breeback, [name] L, PA-C (Family Medicine) Sikora, Rebecca, DPM as Consulting Physician (Podiatry)  Indicate any recent Medical Services you may have received from other than [facility] providers in the past year (date may be approximate).     Assessment:   This is a routine wellness examination for Melissa Sharp.  Hearing/Vision screen No results found.   Goals Addressed             This Visit's Progress    Patient Stated       Patient states she would like to exercise more.        Depression Screen    12/31/2023   11:05 AM 10/30/2023   12:01 PM 04/10/2022    8:55 AM 04/10/2022    8:54 AM 05/29/2020    3:44 PM 07/22/2018    2:58 PM 04/03/2018    8:55 AM  PHQ 2/9 Scores  PHQ - 2 Score 0 0 0 0 2 4 0  PHQ- 9 Score     13 8 4     Fall Risk    12/31/2023   11:06 AM 10/30/2023   12:01 PM 04/10/2022    8:55 AM 05/29/2020    3:44 PM  Fall Risk   Falls in the past year? 0 0 0 1  Number falls in past yr: 0 0 0 1  Injury with Fall? 0 0 0 1  Risk for fall due to : No Fall Risks No Fall Risks No Fall Risks History of fall(s)  Follow up Falls evaluation completed Falls evaluation completed Falls evaluation completed  Falls evaluation completed      Data saved with a previous flowsheet row definition    MEDICARE RISK AT HOME: Medicare Risk at Home Any stairs in or around the home?: Yes If so, are there any without handrails?: Yes Home free of loose throw rugs in walkways, pet beds, electrical cords, etc?: Yes Adequate lighting in your home to reduce risk of falls?: Yes Life alert?: No Use of a cane, walker or w/c?: No Grab bars in the bathroom?: No Shower chair or bench in shower?: No Elevated toilet seat or a handicapped toilet?: No  TIMED UP AND GO:  Was the test  performed?  No    Cognitive Function:        12/31/2023   11:07 AM  6CIT Screen  What Year? 0 points  What month? 0 points  What time? 0 points  Count back from 20 0 points  Months in  reverse 0 points  Repeat phrase 0 points  Total Score 0 points    Immunizations Immunization History  Administered Date(s) Administered   HPV Quadrivalent 07/03/2005, 09/11/2005, 01/07/2006   Influenza Inj Mdck Quad Pf 01/16/2018   Influenza,inj,Quad PF,6+ Mos 02/02/2021   Influenza-Unspecified 11/28/2015, 12/08/2016, 12/16/2016   PFIZER(Purple Top)SARS-COV-2 Vaccination 12/06/2019, 12/30/2019   Tdap 10/27/2013    TDAP status: Due, Education has been provided regarding the importance of this vaccine. Advised may receive this vaccine at local pharmacy or Health Dept. Aware to provide a copy of the vaccination record if obtained from local pharmacy or Health Dept. Verbalized acceptance and understanding.  Flu Vaccine status: Declined, Education has been provided regarding the importance of this vaccine but patient still declined. Advised may receive this vaccine at local pharmacy or Health Dept. Aware to provide a copy of the vaccination record if obtained from local pharmacy or Health Dept. Verbalized acceptance and understanding.  Pneumococcal vaccine status: Declined,  Education has been provided regarding the importance of this vaccine but patient still declined. Advised may receive this vaccine at local pharmacy or Health Dept. Aware to provide a copy of the vaccination record if obtained from local pharmacy or Health Dept. Verbalized acceptance and understanding.   Covid-19 vaccine status: Declined, Education has been provided regarding the importance of this vaccine but patient still declined. Advised may receive this vaccine at local pharmacy or Health Dept.or vaccine clinic. Aware to provide a copy of the vaccination record if obtained from local pharmacy or Health Dept. Verbalized acceptance and understanding.  Qualifies for Shingles Vaccine? No   Zostavax completed No   Shingrix Completed?: No.    Education has been provided regarding the importance of this vaccine. Patient has  been advised to call insurance company to determine out of pocket expense if they have not yet received this vaccine. Advised may also receive vaccine at local pharmacy or Health Dept. Verbalized acceptance and understanding.  Screening Tests Health Maintenance  Topic Date Due   Hepatitis C Screening  Never done   Pneumococcal Vaccine (1 of 2 - PCV) Never done   Hepatitis B Vaccines 19-59 Average Risk (1 of 3 - 19+ 3-dose series) Never done   COVID-19 Vaccine (3 - Pfizer risk series) 01/27/2020   Influenza Vaccine  10/17/2023   DTaP/Tdap/Td (2 - Td or Tdap) 10/28/2023   Mammogram  11/28/2023   Cervical Cancer Screening (HPV/Pap Cotest)  04/30/2024   Medicare Annual Wellness (AWV)  12/30/2024   HPV VACCINES  Completed   HIV Screening  Completed   Meningococcal B Vaccine  Aged Out    Health Maintenance  Health Maintenance Due  Topic Date Due   Hepatitis C Screening  Never done   Pneumococcal Vaccine (1 of 2 - PCV) Never done   Hepatitis B Vaccines 19-59 Average Risk (1 of 3 - 19+ 3-dose series) Never done   COVID-19 Vaccine (3 - Pfizer risk series) 01/27/2020   Influenza Vaccine  10/17/2023   DTaP/Tdap/Td (2 - Td or Tdap) 10/28/2023   Mammogram  11/28/2023    Colorectal cancer screening - not due   Mammogram status: Completed 12/31/2023. Repeat every year   Lung Cancer Screening: (Low Dose CT Chest  recommended if Age 66-80 years, 20 pack-year currently smoking OR have quit w/in 15years.) does not qualify.   Lung Cancer Screening Referral: n/a  Additional Screening:  Hepatitis C Screening: does qualify; Completed   Vision Screening: Recommended annual ophthalmology exams for early detection of glaucoma and other disorders of the eye. Is the patient up to date with their annual eye exam?  Yes  Who is the provider or what is the name of the office in which the patient attends annual eye exams? [facility] If pt is not established with a provider, would they like to be  referred to a provider to establish care? N/a.   Dental Screening: Recommended annual dental exams for proper oral hygiene   Community Resource Referral / Chronic Care Management: CRR required this visit?  No   CCM required this visit?  No     Plan:     I have personally reviewed and noted the following in the patient's chart:   Medical and social history Use of alcohol, tobacco or illicit drugs  Current medications and supplements including opioid prescriptions. Patient is not currently taking opioid prescriptions. Functional ability and status Nutritional status Physical activity Advanced directives List of other physicians Hospitalizations, surgeries, and ER visits in previous 12 months. None Vitals Screenings to include cognitive, depression, and falls Referrals and appointments  In addition, I have reviewed and discussed with patient certain preventive protocols, quality metrics, and best practice recommendations. A written personalized care plan for preventive services as well as general preventive health recommendations were provided to patient.     Bonny Jon Mayor, CMA   12/31/2023   After Visit Summary: (MyChart) Due to this being a telephonic visit, the after visit summary with patients personalized plan was offered to patient via MyChart   Nurse Notes:   Melissa Sharp is a 43 y.o. female patient of [name] Bologna, PA-C who had a Medicare Annual Wellness Visit today via telephone. She reports that he is socially active and does interact with friends/family regularly. She is moderately physically active.

## 2023-12-31 NOTE — Patient Instructions (Signed)
  Melissa Sharp , Thank you for taking time to come for your Medicare Wellness Visit. I appreciate your ongoing commitment to your health goals. Please review the following plan we discussed and let me know if I can assist you in the future.   These are the goals we discussed:  Goals      Patient Stated     Patient states she would like to exercise more.         This is a list of the screening recommended for you and due dates:  Health Maintenance  Topic Date Due   Hepatitis C Screening  Never done   Pneumococcal Vaccine (1 of 2 - PCV) Never done   Hepatitis B Vaccine (1 of 3 - 19+ 3-dose series) Never done   COVID-19 Vaccine (3 - Pfizer risk series) 01/27/2020   Flu Shot  10/17/2023   DTaP/Tdap/Td vaccine (2 - Td or Tdap) 10/28/2023   Breast Cancer Screening  11/28/2023   Pap with HPV screening  04/30/2024   Medicare Annual Wellness Visit  12/30/2024   HPV Vaccine  Completed   HIV Screening  Completed   Meningitis B Vaccine  Aged Out

## 2024-01-01 ENCOUNTER — Ambulatory Visit (INDEPENDENT_AMBULATORY_CARE_PROVIDER_SITE_OTHER): Admitting: Podiatry

## 2024-01-01 ENCOUNTER — Encounter: Payer: Self-pay | Admitting: Podiatry

## 2024-01-01 ENCOUNTER — Ambulatory Visit (INDEPENDENT_AMBULATORY_CARE_PROVIDER_SITE_OTHER)

## 2024-01-01 DIAGNOSIS — M205X2 Other deformities of toe(s) (acquired), left foot: Secondary | ICD-10-CM

## 2024-01-01 NOTE — Progress Notes (Signed)
  Subjective:  Patient ID: Melissa Sharp, female    DOB: 01-Feb-1981,   MRN: [id number]  Chief Complaint  Patient presents with   Toe Pain    I'm here for a pre-op appointment.  I have some questions.    43 y.o. female presents for follow-up of left great toe pain. Relates injection has helped but she would like to discuss further surgical options. Starting to get pain back in the great toe and would like to take care of it. . Denies any other pedal complaints. Denies n/v/f/c.   Past Medical History:  Diagnosis Date   Anxiety    Chalazion left upper eyelid 08/26/2018   Depression    Hypothyroidism    Migraine    PIH (pregnancy induced hypertension) 07/28/2015   Pneumonia due to COVID-19 virus 10/25/2020   Preeclampsia    Sinobronchitis 09/24/2019   Thyroid  activity decreased 10/27/2013    Objective:  Physical Exam: Vascular: DP/PT pulses 2/4 bilateral. CFT <3 seconds. Normal hair growth on digits. No edema.  Skin. No lacerations or abrasions bilateral feet.  Musculoskeletal: MMT 5/5 bilateral lower extremities in DF, PF, Inversion and Eversion. Deceased ROM in DF of ankle joint. Mildly tender over dorsum of first MPJ and to plantar MPJ. Pain with end range DF and PF. No pain elsewhere on the foot.  Neurological: Sensation intact to light touch.   Assessment:   1. Hallux limitus, left      Plan:  Patient was evaluated and treated and all questions answered. -Xrays reviewed. Degenerative changed noted to first MPJ with decreased of joint space and some cystic changes. Abducation of the hallux IPJ noted.  Reviewed previous noted.  -Discussed hallux limitus and  treatement options; conservative and  Surgical management; risks, benefits, alternatives discussed. All patient's questions answered. -Continue meloxicam    -Recommend continue with good supportive shoes and inserts. Discussed stiff soled shoes and use of carbon fiber foot plate.   -Discussed surgical intervention  given failed conservative care. Discussed cheilectomy of the left great toe and perioperative course in detail. Patient would like to proceed. Did discuss akin osteotomy but would require NWB and patient does not believe she can afford this option currently.  -Informed surgical risk consent was reviewed and read aloud to the patient.  I reviewed the films.  I have discussed my findings with the patient in great detail.  I have discussed all risks including but not limited to infection, stiffness, scarring, limp, disability, deformity, damage to blood vessels and nerves, numbness, poor healing, need for braces, arthritis, chronic pain, amputation, death.  All benefits and realistic expectations discussed in great detail.  I have made no promises as to the outcome.  I have provided realistic expectations.  I have offered the patient a 2nd opinion, which they have declined and assured me they preferred to proceed despite the risks. Plan for 12/30  Post-op meds: Tramadol , zofran ,    [place]berry Failing, DPM

## 2024-01-05 ENCOUNTER — Ambulatory Visit: Payer: Self-pay | Admitting: Physician Assistant

## 2024-01-05 ENCOUNTER — Encounter: Payer: Self-pay | Admitting: Physician Assistant

## 2024-01-05 DIAGNOSIS — Z0000 Encounter for general adult medical examination without abnormal findings: Secondary | ICD-10-CM

## 2024-01-05 NOTE — Progress Notes (Signed)
 Normal mammogram. Follow up in 1 year.

## 2024-01-07 ENCOUNTER — Ambulatory Visit: Payer: Self-pay

## 2024-01-07 NOTE — Telephone Encounter (Signed)
 FYI Only or Action Required?: FYI only for provider.  Patient was last seen in primary care on 11/12/2023 by Alvia Bring, DO.  Called Nurse Triage reporting Cough.  Symptoms began several days ago.  Interventions attempted: Rest, hydration, or home remedies.  Symptoms are: gradually worsening.  Triage Disposition: See PCP When Office is Open (Within 3 Days)  Patient/caregiver understands and will follow disposition?: Yes    Copied from CRM [id number]. Topic: Clinical - Red Word Triage >> Jan 07, 2024  9:25 AM Amy B wrote: Red Word that prompted transfer to Nurse Triage: Chest tightness with cough Reason for Disposition  [1] Also has allergy  symptoms (e.g., itchy eyes, clear nasal discharge, postnasal drip) AND [2] they are acting up  Answer Assessment - Initial Assessment Questions Additional info: Requested virtual visit due to children at home are ill as well. Aware she may be asked to go for in person exam and/or testing.    1. ONSET: When did the cough begin?      Monday  2. SEVERITY: How bad is the cough today?      Feels like bronchitis 3. SPUTUM: Describe the color of your sputum (e.g., none, dry cough; clear, white, yellow, green)     Denies  4. HEMOPTYSIS: Are you coughing up any blood? If Yes, ask: How much? (e.g., flecks, streaks, tablespoons, etc.)     denies 5. DIFFICULTY BREATHING: Are you having difficulty breathing? If Yes, ask: How bad is it? (e.g., mild, moderate, severe)      Denies  6. FEVER: Do you have a fever? If Yes, ask: What is your temperature, how was it measured, and when did it start?     Denies  7. CARDIAC HISTORY: Do you have any history of heart disease? (e.g., heart attack, congestive heart failure)       8. LUNG HISTORY: Do you have any history of lung disease?  (e.g., pulmonary embolus, asthma, emphysema)      9. PE RISK FACTORS: Do you have a history of blood clots? (or: recent major surgery, recent prolonged  travel, bedridden)      10. OTHER SYMPTOMS: Do you have any other symptoms? (e.g., runny nose, wheezing, chest pain)      Chest tightness with cough  Protocols used: Cough - Acute Non-Productive-A-AH

## 2024-01-07 NOTE — Telephone Encounter (Signed)
 Is virtual visit as shown scheduled o.k. for this evaluation?

## 2024-01-07 NOTE — Telephone Encounter (Signed)
 Patient shows scheduled for video visit with Dr. Alvia tomorrow ( 01/08/2024)

## 2024-01-08 ENCOUNTER — Telehealth (INDEPENDENT_AMBULATORY_CARE_PROVIDER_SITE_OTHER): Admitting: Family Medicine

## 2024-01-08 ENCOUNTER — Encounter: Payer: Self-pay | Admitting: Family Medicine

## 2024-01-08 VITALS — Temp 98.5°F | Ht 64.0 in | Wt 125.0 lb

## 2024-01-08 DIAGNOSIS — J40 Bronchitis, not specified as acute or chronic: Secondary | ICD-10-CM

## 2024-01-08 DIAGNOSIS — J209 Acute bronchitis, unspecified: Secondary | ICD-10-CM | POA: Diagnosis not present

## 2024-01-08 MED ORDER — ALBUTEROL SULFATE HFA 108 (90 BASE) MCG/ACT IN AERS: 2.0000 | INHALATION_SPRAY | Freq: Four times a day (QID) | RESPIRATORY_TRACT | 5 refills | Status: AC | PRN

## 2024-01-08 MED ORDER — PREDNISONE 20 MG PO TABS: 20.0000 mg | ORAL_TABLET | Freq: Two times a day (BID) | ORAL | 0 refills | Status: AC

## 2024-01-08 MED ORDER — PROMETHAZINE-DM 6.25-15 MG/5ML PO SYRP: 5.0000 mL | ORAL_SOLUTION | Freq: Four times a day (QID) | ORAL | 0 refills | Status: DC | PRN

## 2024-01-08 MED ORDER — BENZONATATE 200 MG PO CAPS: 200.0000 mg | ORAL_CAPSULE | Freq: Two times a day (BID) | ORAL | 0 refills | Status: DC | PRN

## 2024-01-08 NOTE — Assessment & Plan Note (Signed)
 Recommend continued supportive care. Adding steroid burst.  Albuterol  renewed. Tessalon  perles and promethazine  DM cough syrup as needed. Red flags reviewed.

## 2024-01-08 NOTE — Progress Notes (Signed)
 Melissa Sharp - 43 y.o. female MRN [id number]  Date of birth: 1980-04-04   All issues noted in this document were discussed and addressed.  No physical exam was performed (except for noted visual exam findings with Video Visits).  I discussed the limitations of evaluation and management by telemedicine and the availability of in person appointments. The patient expressed understanding and agreed to proceed.  I connected withNAME@ on 01/08/24 at 11:10 AM EDT by a video enabled telemedicine application and verified that I am speaking with the correct person using two identifiers.  Present at visit: Melissa Ku, DO Melissa Sharp   Patient Location: Home [address] [place] [address]   Provider location:   [facility]  Chief Complaint  Patient presents with   Cough    HPI  Melissa Sharp is a 43 y.o. female who presents via audio/video conferencing for a telehealth visit today.  She has complaint of cough, chest congestion and hoarseness.  Symptoms started a few days ago. Denies fever, chills or dyspnea.  She needs renewal of albuterol  inhaler.  She is staying well hydrated.  Her daughter was just dx with Croup.    ROS:  A comprehensive ROS was completed and negative except as noted per HPI  Past Medical History:  Diagnosis Date   Anxiety    Chalazion left upper eyelid 08/26/2018   Depression    Hypothyroidism    Migraine    PIH (pregnancy induced hypertension) 07/28/2015   Pneumonia due to COVID-19 virus 10/25/2020   Preeclampsia    Sinobronchitis 09/24/2019   Thyroid  activity decreased 10/27/2013    Past Surgical History:  Procedure Laterality Date   TONSILECTOMY/ADENOIDECTOMY WITH MYRINGOTOMY      Family History  Problem Relation Age of Onset   Atrial fibrillation Mother    Alcohol abuse Father    Pancreatic cancer Father    Hyperlipidemia Other    Migraines Neg Hx     Social History   Socioeconomic History   Marital status: Married    Spouse  name: Fonda   Number of children: 0   Years of education: Not on file   Highest education level: Not on file  Occupational History   Occupation: Multimedia programmer [employer]  Tobacco Use   Smoking status: Never   Smokeless tobacco: Never  Substance and Sexual Activity   Alcohol use: Yes    Comment: 5 drinks a week - occasionally   Drug use: No   Sexual activity: Yes    Birth control/protection: None  Other Topics Concern   Not on file  Social History Narrative   Lives w/ husband and 2 children.    Social Drivers of Corporate investment banker Strain: Low Risk  (12/31/2023)   Overall Financial Resource Strain (CARDIA)    Difficulty of Paying Living Expenses: Not hard at all  Food Insecurity: No Food Insecurity (12/31/2023)   Hunger Vital Sign    Worried About Running Out of Food in the Last Year: Never true    Ran Out of Food in the Last Year: Never true  Transportation Needs: No Transportation Needs (12/31/2023)   PRAPARE - Administrator, Civil Service (Medical): No    Lack of Transportation (Non-Medical): No  Physical Activity: Insufficiently Active (12/31/2023)   Exercise Vital Sign    Days of Exercise per Week: 2 days    Minutes of Exercise per Session: 50 min  Stress: No Stress Concern Present (12/31/2023)   [employer] of Occupational Health -  Occupational Stress Questionnaire    Feeling of Stress: Not at all  Social Connections: Moderately Integrated (12/31/2023)   Social Connection and Isolation Panel    Frequency of Communication with Friends and Family: More than three times a week    Frequency of Social Gatherings with Friends and Family: Once a week    Attends Religious Services: More than 4 times per year    Active Member of [employer] or Organizations: No    Attends Banker Meetings: Never    Marital Status: Married  Catering manager Violence: Not At Risk (12/31/2023)   Humiliation, Afraid, Rape, and Kick questionnaire    Fear of Current or  Ex-Partner: No    Emotionally Abused: No    Physically Abused: No    Sexually Abused: No     Current Outpatient Medications:    Atogepant  (QULIPTA ) 10 MG TABS, Take one tablet by mouth daily for migraine prevention., Disp: 90 tablet, Rfl: 1   benzonatate  (TESSALON ) 200 MG capsule, Take 1 capsule (200 mg total) by mouth 2 (two) times daily as needed for cough., Disp: 20 capsule, Rfl: 0   buPROPion  (WELLBUTRIN  XL) 150 MG 24 hr tablet, Take 150 mg by mouth daily., Disp: , Rfl:    cariprazine (VRAYLAR) 1.5 MG capsule, Take 3 mg by mouth daily., Disp: , Rfl:    cyanocobalamin  (VITAMIN B12) 1000 MCG tablet, Take 1 tablet (1,000 mcg total) by mouth daily., Disp: 90 tablet, Rfl: 3   dapagliflozin  propanediol (FARXIGA ) 10 MG TABS tablet, Take 1 tablet (10 mg total) by mouth daily., Disp: 90 tablet, Rfl: 3   dicyclomine  (BENTYL ) 10 MG capsule, Take 1 capsule (10 mg total) by mouth 2 (two) times daily., Disp: 60 capsule, Rfl: 2   fenofibrate  (TRICOR ) 145 MG tablet, Take 1 tablet (145 mg total) by mouth daily. Needs appointment and labs., Disp: 90 tablet, Rfl: 0   fluticasone  (FLONASE ) 50 MCG/ACT nasal spray, Place 2 sprays into both nostrils daily., Disp: 16 g, Rfl: 0   hydrochlorothiazide  (HYDRODIURIL ) 25 MG tablet, Take 1 tablet (25 mg total) by mouth daily., Disp: 90 tablet, Rfl: 0   hydrOXYzine (VISTARIL) 25 MG capsule, Take 25-50 mg by mouth at bedtime., Disp: , Rfl:    ipratropium (ATROVENT ) 0.06 % nasal spray, Place 2 sprays into both nostrils 4 (four) times daily., Disp: 15 mL, Rfl: 3   levothyroxine  (SYNTHROID ) 75 MCG tablet, Take 1 tablet (75 mcg total) by mouth daily before breakfast., Disp: 90 tablet, Rfl: 0   meloxicam  (MOBIC ) 15 MG tablet, Take 1 tablet (15 mg total) by mouth daily., Disp: 30 tablet, Rfl: 0   montelukast  (SINGULAIR ) 10 MG tablet, Take 1 tablet (10 mg total) by mouth daily., Disp: 30 tablet, Rfl: 3   predniSONE  (DELTASONE ) 20 MG tablet, Take 1 tablet (20 mg total) by mouth 2  (two) times daily with a meal for 5 days., Disp: 10 tablet, Rfl: 0   promethazine -dextromethorphan (PROMETHAZINE -DM) 6.25-15 MG/5ML syrup, Take 5 mLs by mouth 4 (four) times daily as needed for cough (Maximum dose: 30mL in 24 hours)., Disp: 125 mL, Rfl: 0   Rimegepant Sulfate (NURTEC) 75 MG TBDP, Take 1 tablet (75 mg total) by mouth daily as needed., Disp: 8 tablet, Rfl: 4   rizatriptan  (MAXALT -MLT) 10 MG disintegrating tablet, Take 1 tablet (10 mg total) by mouth as needed for migraine. May repeat in 2 hours if needed, Disp: 10 tablet, Rfl: 1   rosuvastatin  (CRESTOR ) 20 MG tablet, Take 1 tablet (20  mg total) by mouth daily., Disp: 90 tablet, Rfl: 3   spironolactone  (ALDACTONE ) 25 MG tablet, Take 1 tablet (25 mg total) by mouth daily., Disp: 90 tablet, Rfl: 1   traZODone (DESYREL) 100 MG tablet, Take 100 mg by mouth at bedtime as needed., Disp: , Rfl:    albuterol  (VENTOLIN  HFA) 108 (90 Base) MCG/ACT inhaler, Inhale 2 puffs into the lungs every 6 (six) hours as needed for wheezing., Disp: 2 each, Rfl: 5  EXAM:  VITALS per patient if applicable: Temp 98.5 F (36.9 C)   Ht 5' 4 (1.626 m)   Wt 125 lb (56.7 kg)   LMP 12/24/2023   BMI 21.46 kg/m   GENERAL: alert, oriented, appears well and in no acute distress  HEENT: atraumatic, conjunttiva clear, no obvious abnormalities on inspection of external nose and ears  NECK: normal movements of the head and neck  LUNGS: on inspection no signs of respiratory distress, breathing rate appears normal, no obvious gross SOB, gasping or wheezing  CV: no obvious cyanosis  MS: moves all visible extremities without noticeable abnormality  PSYCH/NEURO: pleasant and cooperative, no obvious depression or anxiety, speech and thought processing grossly intact  ASSESSMENT AND PLAN:  Discussed the following assessment and plan:  Acute bronchitis Recommend continued supportive care. Adding steroid burst.  Albuterol  renewed. Tessalon  perles and promethazine   DM cough syrup as needed. Red flags reviewed.       I discussed the assessment and treatment plan with the patient. The patient was provided an opportunity to ask questions and all were answered. The patient agreed with the plan and demonstrated an understanding of the instructions.   The patient was advised to call back or seek an in-person evaluation if the symptoms worsen or if the condition fails to improve as anticipated.    Melissa Ku, DO

## 2024-01-08 NOTE — Progress Notes (Signed)
 Started Sunday. Seemed like allergies. 43 year old has croup.  Current sx: dry cough, loss of voice

## 2024-01-14 NOTE — Telephone Encounter (Unsigned)
 Copied from CRM [id number]. Topic: General - Other >> Jan 14, 2024 12:40 PM Aleatha C wrote: Reason for CRM: Patient glands are swollen in throat and wanted to know if she should come in or if something needs to be prescribe please call patient  [phone] (M)

## 2024-01-16 ENCOUNTER — Encounter: Payer: Self-pay | Admitting: Urgent Care

## 2024-01-16 ENCOUNTER — Ambulatory Visit (INDEPENDENT_AMBULATORY_CARE_PROVIDER_SITE_OTHER): Admitting: Urgent Care

## 2024-01-16 VITALS — BP 121/86 | HR 70 | Ht 63.0 in | Wt 135.0 lb

## 2024-01-16 DIAGNOSIS — R140 Abdominal distension (gaseous): Secondary | ICD-10-CM

## 2024-01-16 DIAGNOSIS — K1120 Sialoadenitis, unspecified: Secondary | ICD-10-CM | POA: Diagnosis not present

## 2024-01-16 MED ORDER — AMOXICILLIN-POT CLAVULANATE 875-125 MG PO TABS: 1.0000 | ORAL_TABLET | Freq: Two times a day (BID) | ORAL | 0 refills | Status: DC

## 2024-01-16 NOTE — Progress Notes (Signed)
 Established Patient Office Visit  Subjective:  Patient ID: Melissa Sharp, female    DOB: 1980-07-09  Age: 43 y.o. MRN: [id number]  Chief Complaint  Patient presents with   Fatigue    Swollen glands x 2 weeks    HPI  Discussed the use of AI scribe software for clinical note transcription with the patient, who gave verbal consent to proceed.  History of Present Illness   Melissa Sharp is a 43 year old female who presents with swollen glands and severe fatigue for two weeks.  She has experienced swollen glands and severe fatigue for the past two weeks, with the fatigue being severe enough to hinder her ability to get out of bed and perform daily activities.  Approximately one week ago, she was treated for croup and bronchitis with prednisone , Tessalon , albuterol , and promethazine , and she reports feeling overall better from these conditions.  She reports pain in the lower jaw area bilaterally and is unsure whether it is related to a lymph node or a tonsil. It is painful and hurts to swallow, but there is no sore throat, fever, or hoarseness.     Additionally, pt complains of persistent gas and bloating, but no N/V/D. She has a routine follow up with her PCP in 10 days for routine bloodwork.  Patient Active Problem List   Diagnosis Date Noted   Acute bronchitis 01/08/2024   Eustachian tube dysfunction, right 11/12/2023   Pain of toe of left foot 11/12/2023   Yeast vaginitis 06/03/2023   Hair loss 03/17/2023   OSA (obstructive sleep apnea) 01/29/2023   Elevated ferritin 01/29/2023   Family history of CKD (chronic kidney disease) 12/20/2022   Stage 3a chronic kidney disease (HCC) 12/20/2022   Snoring 10/30/2022   Non-restorative sleep 10/30/2022   No energy 10/30/2022   Vitamin D  deficiency 04/10/2022   Iron deficiency anemia 04/10/2022   Hypothyroidism 10/06/2021   Chronic left-sided low back pain with left-sided sciatica 10/05/2021   Right foot pain 08/15/2021   Primary  hypertension 01/25/2021   Chronic bilateral low back pain without sciatica 01/25/2021   Elevated brain natriuretic peptide (BNP) level 10/25/2020   Low hemoglobin 10/25/2020   Nonintractable headache 10/25/2020   Palpitations 10/25/2020   Bilateral calf pain 10/13/2020   Body aches 10/13/2020   Weakness 10/13/2020   Attention deficit hyperactivity disorder (ADHD), combined type 05/30/2020   Female fertility problems 05/30/2020   Recurrent isolated sleep paralysis 12/23/2019   Staring episodes 12/23/2019   Chronic fatigue 12/23/2019   Tinea corporis 11/19/2019   Annular skin lesion 06/25/2018   Influenza-like illness 05/22/2018   Cervical radiculopathy 03/15/2018   Muscle weakness of left upper extremity 03/15/2018   Hypertension goal BP (blood pressure) < 130/80 03/15/2018   Functional diarrhea 07/01/2017   Right upper quadrant guarding 07/01/2017   Bloating 07/01/2017   Nausea 07/01/2017   Non-seasonal allergic rhinitis 05/08/2017   Anxiety 05/08/2017   Tachycardia with heart rate 100-120 beats per minute 02/25/2017   Tapeworm infection 02/25/2017   Elevated blood pressure reading 05/03/2016   Overweight (BMI 25.0-29.9) 03/20/2016   Low back pain with radiation, unspecified laterality 02/23/2016   Diastasis recti 02/20/2016   Migraine with status migrainosus 07/15/2015   Intractable migraine 06/30/2015   Cervical pain (neck) 06/26/2015   Post concussive syndrome 05/21/2015   Poor venous access 01/24/2015   Elevated liver enzymes 11/02/2013   Migraine without aura 10/29/2013   Esophageal reflux 10/29/2013   Depression, major, recurrent 10/29/2013   Mixed anxiety  depressive disorder 10/29/2013   Low serum vitamin B12 10/29/2013   Hypertriglyceridemia 10/29/2013   IBS (irritable bowel syndrome) 10/27/2013   Past Medical History:  Diagnosis Date   Anxiety    Chalazion left upper eyelid 08/26/2018   Depression    Hypothyroidism    Migraine    PIH (pregnancy induced  hypertension) 07/28/2015   Pneumonia due to COVID-19 virus 10/25/2020   Preeclampsia    Sinobronchitis 09/24/2019   Thyroid  activity decreased 10/27/2013   Past Surgical History:  Procedure Laterality Date   TONSILECTOMY/ADENOIDECTOMY WITH MYRINGOTOMY     Social History   Tobacco Use   Smoking status: Never   Smokeless tobacco: Never  Substance Use Topics   Alcohol use: Yes    Comment: 5 drinks a week - occasionally   Drug use: No      ROS: as noted in HPI  Objective:     BP 121/86   Pulse 70   Ht 5' 3 (1.6 m)   Wt 135 lb (61.2 kg)   LMP 12/24/2023   SpO2 100%   BMI 23.91 kg/m  BP Readings from Last 3 Encounters:  01/16/24 121/86  11/12/23 111/67  10/30/23 109/66   Wt Readings from Last 3 Encounters:  01/16/24 135 lb (61.2 kg)  01/08/24 125 lb (56.7 kg)  12/31/23 125 lb (56.7 kg)      Physical Exam Vitals and nursing note reviewed. Exam conducted with a chaperone present.  Constitutional:      General: She is not in acute distress.    Appearance: Normal appearance. She is normal weight. She is not ill-appearing, toxic-appearing or diaphoretic.  HENT:     Head: Normocephalic and atraumatic.     Salivary Glands: Right salivary gland is tender. Right salivary gland is not diffusely enlarged. Left salivary gland is tender. Left salivary gland is not diffusely enlarged.     Right Ear: Tympanic membrane, ear canal and external ear normal. There is no impacted cerumen.     Left Ear: Tympanic membrane, ear canal and external ear normal. There is no impacted cerumen.     Nose: Nose normal. No congestion or rhinorrhea.     Mouth/Throat:     Lips: Pink.     Mouth: Mucous membranes are moist.     Palate: No mass and lesions.     Pharynx: Oropharynx is clear. Uvula midline. No pharyngeal swelling, oropharyngeal exudate, posterior oropharyngeal erythema, uvula swelling or postnasal drip.     Tonsils: No tonsillar exudate or tonsillar abscesses.  Eyes:     General:  No scleral icterus.       Right eye: No discharge.        Left eye: No discharge.     Extraocular Movements: Extraocular movements intact.     Pupils: Pupils are equal, round, and reactive to light.  Neck:     Thyroid : No thyroid  mass, thyromegaly or thyroid  tenderness.   Cardiovascular:     Rate and Rhythm: Normal rate and regular rhythm.     Heart sounds: No murmur heard. Pulmonary:     Effort: Pulmonary effort is normal. No respiratory distress.     Breath sounds: Normal breath sounds. No stridor. No wheezing or rhonchi.  Musculoskeletal:     Cervical back: Normal range of motion and neck supple. No rigidity or tenderness. No pain with movement or muscular tenderness. Normal range of motion.  Lymphadenopathy:     Cervical: No cervical adenopathy.     Right cervical: No superficial,  deep or posterior cervical adenopathy.    Left cervical: No superficial, deep or posterior cervical adenopathy.  Neurological:     Mental Status: She is alert.      No results found for any visits on 01/16/24.  Last CBC Lab Results  Component Value Date   WBC 5.3 10/30/2022   HGB 12.9 10/30/2022   HCT 39.4 10/30/2022   MCV 86 10/30/2022   MCH 28.2 10/30/2022   RDW 12.0 10/30/2022   PLT 378 10/30/2022   Last metabolic panel Lab Results  Component Value Date   GLUCOSE 92 04/07/2023   NA 140 04/07/2023   K 4.6 04/07/2023   CL 102 04/07/2023   CO2 21 04/07/2023   BUN 15 04/07/2023   CREATININE 1.52 (H) 04/07/2023   EGFR 44 (L) 04/07/2023   CALCIUM  10.2 04/07/2023   PROT 6.7 04/07/2023   ALBUMIN 4.6 04/07/2023   LABGLOB 2.1 04/07/2023   BILITOT 0.3 04/07/2023   ALKPHOS 24 (L) 04/07/2023   AST 16 04/07/2023   ALT 18 04/07/2023   ANIONGAP 10 07/28/2015      The ASCVD Risk score (Arnett DK, et al., 2019) failed to calculate for the following reasons:   The valid total cholesterol range is 130 to 320 mg/dL  Assessment & Plan:  Sialoadenitis -     Amoxicillin -Pot Clavulanate;  Take 1 tablet by mouth 2 (two) times daily with a meal.  Dispense: 20 tablet; Refill: 0  Abdominal distension  Assessment and Plan    Sialoadenitis Sx and location of pain suggestive of viral vs bacterial sialoadenitis. Given timeframe of sx lasting >2 weeks, feel it is appropriate to initiate abx therapy. No abx allergies noted. Overall benign appearing exam - suck on warheads and lemonheads in case a stone is developing - start augmentin  to treat suspected bacterial sialoadenitis  Abdominal distention This sounds to be a chronic issue. Will start with tx of the above and see if GI tract responds to abx and probiotics. Pt has follow up on 11/10. Consider further workup if still sx.         No follow-ups on file.   Benton LITTIE Gave, PA

## 2024-01-16 NOTE — Patient Instructions (Signed)
 Take the augmentin  twice daily with food. Please suck on Warheads or [employer].  Take OTC Florastor probiotics to help offset any upset stomach or diarrhea from the medication.  Please take Quercetin with zinc - this helps boost your immune system.  Follow up with [name] if your fatigue and distention continues.

## 2024-01-17 ENCOUNTER — Other Ambulatory Visit: Payer: Self-pay | Admitting: Physician Assistant

## 2024-01-17 DIAGNOSIS — E039 Hypothyroidism, unspecified: Secondary | ICD-10-CM

## 2024-01-19 ENCOUNTER — Telehealth: Payer: Self-pay | Admitting: Physician Assistant

## 2024-01-19 NOTE — Telephone Encounter (Signed)
 Copied from CRM [id number]. Topic: Clinical - Lab/Test Results >> Jan 19, 2024 11:12 AM Melissa Sharp wrote: Reason for CRM: Pt has scheduled physical 01/26/2024 needs lab orders to include for gout. Please call pt at [phone] to confirm.

## 2024-01-20 ENCOUNTER — Other Ambulatory Visit: Payer: Self-pay

## 2024-01-20 NOTE — Telephone Encounter (Signed)
 Ok to order all labs ordered 10/17 and add uric acid to them. Let patient know when ready. Thank you.

## 2024-01-20 NOTE — Telephone Encounter (Signed)
 Patient checking on order request. Advised one more lab was added and is needing to be placed. Advised patient to wait for call from office once that order has been placed. Patient verbalized understanding and will await call.

## 2024-01-21 ENCOUNTER — Other Ambulatory Visit: Payer: Self-pay

## 2024-01-21 DIAGNOSIS — Z0000 Encounter for general adult medical examination without abnormal findings: Secondary | ICD-10-CM

## 2024-01-21 DIAGNOSIS — D509 Iron deficiency anemia, unspecified: Secondary | ICD-10-CM

## 2024-01-21 DIAGNOSIS — M79675 Pain in left toe(s): Secondary | ICD-10-CM

## 2024-01-21 DIAGNOSIS — E039 Hypothyroidism, unspecified: Secondary | ICD-10-CM

## 2024-01-21 DIAGNOSIS — E559 Vitamin D deficiency, unspecified: Secondary | ICD-10-CM

## 2024-01-21 DIAGNOSIS — E538 Deficiency of other specified B group vitamins: Secondary | ICD-10-CM

## 2024-01-21 NOTE — Progress Notes (Signed)
 Error

## 2024-01-22 NOTE — Telephone Encounter (Signed)
 Last read by Allean Cardinal at 3:40PM on 01/21/2024.

## 2024-01-24 LAB — CBC WITH DIFFERENTIAL/PLATELET
Basophils Absolute: 0.1 x10E3/uL (ref 0.0–0.2)
Basos: 1 %
EOS (ABSOLUTE): 0.1 x10E3/uL (ref 0.0–0.4)
Eos: 2 %
Hematocrit: 36.7 % (ref 34.0–46.6)
Hemoglobin: 11.8 g/dL (ref 11.1–15.9)
Immature Grans (Abs): 0.1 x10E3/uL (ref 0.0–0.1)
Immature Granulocytes: 1 %
Lymphocytes Absolute: 2.5 x10E3/uL (ref 0.7–3.1)
Lymphs: 30 %
MCH: 29.3 pg (ref 26.6–33.0)
MCHC: 32.2 g/dL (ref 31.5–35.7)
MCV: 91 fL (ref 79–97)
Monocytes Absolute: 0.5 x10E3/uL (ref 0.1–0.9)
Monocytes: 7 %
Neutrophils Absolute: 4.8 x10E3/uL (ref 1.4–7.0)
Neutrophils: 59 %
Platelets: 426 x10E3/uL (ref 150–450)
RBC: 4.03 x10E6/uL (ref 3.77–5.28)
RDW: 12.4 % (ref 11.7–15.4)
WBC: 8.1 x10E3/uL (ref 3.4–10.8)

## 2024-01-24 LAB — CMP14+EGFR
ALT: 15 IU/L (ref 0–32)
AST: 15 IU/L (ref 0–40)
Albumin: 4.6 g/dL (ref 3.9–4.9)
Alkaline Phosphatase: 23 IU/L — ABNORMAL LOW (ref 41–116)
BUN/Creatinine Ratio: 20 (ref 9–23)
BUN: 29 mg/dL — ABNORMAL HIGH (ref 6–24)
Bilirubin Total: 0.3 mg/dL (ref 0.0–1.2)
CO2: 20 mmol/L (ref 20–29)
Calcium: 10.1 mg/dL (ref 8.7–10.2)
Chloride: 100 mmol/L (ref 96–106)
Creatinine, Ser: 1.48 mg/dL — ABNORMAL HIGH (ref 0.57–1.00)
Globulin, Total: 2.1 g/dL (ref 1.5–4.5)
Glucose: 83 mg/dL (ref 70–99)
Potassium: 4.9 mmol/L (ref 3.5–5.2)
Sodium: 136 mmol/L (ref 134–144)
Total Protein: 6.7 g/dL (ref 6.0–8.5)
eGFR: 45 mL/min/1.73 — ABNORMAL LOW (ref >59)

## 2024-01-24 LAB — URIC ACID: Uric Acid: 4.9 mg/dL (ref 2.6–6.2)

## 2024-01-24 LAB — TSH: TSH: 0.654 u[IU]/mL (ref 0.450–4.500)

## 2024-01-24 LAB — VITAMIN B12: Vitamin B-12: 1581 pg/mL — ABNORMAL HIGH (ref 232–1245)

## 2024-01-24 LAB — LIPID PANEL
Chol/HDL Ratio: 2.4 ratio (ref 0.0–4.4)
Cholesterol, Total: 137 mg/dL (ref 100–199)
HDL: 57 mg/dL (ref >39)
LDL Chol Calc (NIH): 52 mg/dL (ref 0–99)
Triglycerides: 170 mg/dL — ABNORMAL HIGH (ref 0–149)
VLDL Cholesterol Cal: 28 mg/dL (ref 5–40)

## 2024-01-24 LAB — VITAMIN D 25 HYDROXY (VIT D DEFICIENCY, FRACTURES): Vit D, 25-Hydroxy: 69.3 ng/mL (ref 30.0–100.0)

## 2024-01-26 ENCOUNTER — Encounter: Payer: Self-pay | Admitting: Physician Assistant

## 2024-01-26 ENCOUNTER — Ambulatory Visit: Admitting: Physician Assistant

## 2024-01-26 VITALS — BP 106/67 | HR 61 | Ht 63.0 in | Wt 134.0 lb

## 2024-01-26 DIAGNOSIS — Z0000 Encounter for general adult medical examination without abnormal findings: Secondary | ICD-10-CM

## 2024-01-26 DIAGNOSIS — E039 Hypothyroidism, unspecified: Secondary | ICD-10-CM

## 2024-01-26 DIAGNOSIS — E781 Pure hyperglyceridemia: Secondary | ICD-10-CM

## 2024-01-26 DIAGNOSIS — G4700 Insomnia, unspecified: Secondary | ICD-10-CM | POA: Diagnosis not present

## 2024-01-26 DIAGNOSIS — I10 Essential (primary) hypertension: Secondary | ICD-10-CM

## 2024-01-26 DIAGNOSIS — M6208 Separation of muscle (nontraumatic), other site: Secondary | ICD-10-CM

## 2024-01-26 DIAGNOSIS — Z23 Encounter for immunization: Secondary | ICD-10-CM

## 2024-01-26 DIAGNOSIS — N1831 Chronic kidney disease, stage 3a: Secondary | ICD-10-CM

## 2024-01-26 MED ORDER — LEVOTHYROXINE SODIUM 75 MCG PO TABS: 75.0000 ug | ORAL_TABLET | Freq: Every day | ORAL | 4 refills | Status: AC

## 2024-01-26 MED ORDER — ROSUVASTATIN CALCIUM 40 MG PO TABS: 40.0000 mg | ORAL_TABLET | Freq: Every day | ORAL | 4 refills | Status: AC

## 2024-01-26 MED ORDER — HYDROCHLOROTHIAZIDE 25 MG PO TABS: 25.0000 mg | ORAL_TABLET | Freq: Every day | ORAL | 4 refills | Status: AC

## 2024-01-26 MED ORDER — TRAZODONE HCL 100 MG PO TABS: 50.0000 mg | ORAL_TABLET | Freq: Every evening | ORAL | Status: AC | PRN

## 2024-01-26 NOTE — Patient Instructions (Signed)

## 2024-01-26 NOTE — Progress Notes (Signed)
 Complete physical exam  Patient: Melissa Sharp   DOB: March 21, 1980   43 y.o. Female  MRN: [id number]  Subjective:    No chief complaint on file.   Melissa Sharp is a 43 y.o. female who presents today for a complete physical exam. She reports consuming a {diet types:17450} diet. {types:19826} She generally feels {DESC; WELL/FAIRLY WELL/POORLY:18703}. She reports sleeping {DESC; WELL/FAIRLY WELL/POORLY:18703}. She {does/does not:200015} have additional problems to discuss today.    Most recent fall risk assessment:    12/31/2023   11:06 AM  Fall Risk   Falls in the past year? 0  Number falls in past yr: 0  Injury with Fall? 0  Risk for fall due to : No Fall Risks  Follow up Falls evaluation completed     Most recent depression screenings:    12/31/2023   11:05 AM 10/30/2023   12:01 PM  PHQ 2/9 Scores  PHQ - 2 Score 0 0    {VISON DENTAL STD PSA (Optional):27386}  {History (Optional):23778}  Patient Care Team: Kimaya Whitlatch L, PA-C as PCP - General (Family Medicine) Shamyia Grandpre L, PA-C (Family Medicine) Joya Stabs, DPM as Consulting Physician (Podiatry)   Outpatient Medications Prior to Visit  Medication Sig   albuterol  (VENTOLIN  HFA) 108 (90 Base) MCG/ACT inhaler Inhale 2 puffs into the lungs every 6 (six) hours as needed for wheezing.   amoxicillin -clavulanate (AUGMENTIN ) 875-125 MG tablet Take 1 tablet by mouth 2 (two) times daily with a meal.   Atogepant  (QULIPTA ) 10 MG TABS Take one tablet by mouth daily for migraine prevention.   benzonatate  (TESSALON ) 200 MG capsule Take 1 capsule (200 mg total) by mouth 2 (two) times daily as needed for cough.   buPROPion  (WELLBUTRIN  XL) 150 MG 24 hr tablet Take 150 mg by mouth daily.   cariprazine (VRAYLAR) 1.5 MG capsule Take 3 mg by mouth daily.   cyanocobalamin  (VITAMIN B12) 1000 MCG tablet Take 1 tablet (1,000 mcg total) by mouth daily.   dapagliflozin  propanediol (FARXIGA ) 10 MG TABS tablet Take 1 tablet (10 mg  total) by mouth daily.   dicyclomine  (BENTYL ) 10 MG capsule Take 1 capsule (10 mg total) by mouth 2 (two) times daily.   fenofibrate  (TRICOR ) 145 MG tablet Take 1 tablet (145 mg total) by mouth daily. Needs appointment and labs.   fluticasone  (FLONASE ) 50 MCG/ACT nasal spray Place 2 sprays into both nostrils daily.   hydrochlorothiazide  (HYDRODIURIL ) 25 MG tablet Take 1 tablet (25 mg total) by mouth daily.   hydrOXYzine (VISTARIL) 25 MG capsule Take 25-50 mg by mouth at bedtime.   ipratropium (ATROVENT ) 0.06 % nasal spray Place 2 sprays into both nostrils 4 (four) times daily.   levothyroxine  (SYNTHROID ) 75 MCG tablet Take 1 tablet (75 mcg total) by mouth daily before breakfast.   meloxicam  (MOBIC ) 15 MG tablet Take 1 tablet (15 mg total) by mouth daily.   montelukast  (SINGULAIR ) 10 MG tablet Take 1 tablet (10 mg total) by mouth daily.   promethazine -dextromethorphan (PROMETHAZINE -DM) 6.25-15 MG/5ML syrup Take 5 mLs by mouth 4 (four) times daily as needed for cough (Maximum dose: 30mL in 24 hours).   Rimegepant Sulfate (NURTEC) 75 MG TBDP Take 1 tablet (75 mg total) by mouth daily as needed.   rizatriptan  (MAXALT -MLT) 10 MG disintegrating tablet Take 1 tablet (10 mg total) by mouth as needed for migraine. May repeat in 2 hours if needed   rosuvastatin  (CRESTOR ) 20 MG tablet Take 1 tablet (20 mg total) by mouth daily.  spironolactone  (ALDACTONE ) 25 MG tablet Take 1 tablet (25 mg total) by mouth daily.   traZODone (DESYREL) 100 MG tablet Take 100 mg by mouth at bedtime as needed.   No facility-administered medications prior to visit.    ROS        Objective:     LMP 12/24/2023  {Vitals History (Optional):23777}  Physical Exam   No results found for any visits on 01/26/24. {Show previous labs (optional):23779}    Assessment & Plan:    Routine Health Maintenance and Physical Exam  Immunization History  Administered Date(s) Administered   HPV Quadrivalent 07/03/2005,  09/11/2005, 01/07/2006   Influenza Inj Mdck Quad Pf 01/16/2018   Influenza,inj,Quad PF,6+ Mos 02/02/2021   Influenza-Unspecified 11/28/2015, 12/08/2016, 12/16/2016   PFIZER(Purple Top)SARS-COV-2 Vaccination 12/06/2019, 12/30/2019   Tdap 10/27/2013    Health Maintenance  Topic Date Due   Hepatitis C Screening  Never done   Pneumococcal Vaccine (1 of 2 - PCV) Never done   Hepatitis B Vaccines 19-59 Average Risk (1 of 3 - 19+ 3-dose series) Never done   DTaP/Tdap/Td (2 - Td or Tdap) 10/28/2023   COVID-19 Vaccine (3 - Pfizer risk series) 02/06/2024 (Originally 01/27/2020)   Influenza Vaccine  06/15/2024 (Originally 10/17/2023)   Cervical Cancer Screening (HPV/Pap Cotest)  04/30/2024   Mammogram  12/30/2024   Medicare Annual Wellness (AWV)  12/30/2024   HPV VACCINES  Completed   HIV Screening  Completed   Meningococcal B Vaccine  Aged Out    Discussed health benefits of physical activity, and encouraged her to engage in regular exercise appropriate for her age and condition.  Problem List Items Addressed This Visit   None Visit Diagnoses       Routine physical examination    -  Primary      No follow-ups on file.     Debralee Braaksma, PA-C

## 2024-01-28 ENCOUNTER — Encounter: Payer: Self-pay | Admitting: Physician Assistant

## 2024-01-28 DIAGNOSIS — G4700 Insomnia, unspecified: Secondary | ICD-10-CM | POA: Insufficient documentation

## 2024-01-29 NOTE — Addendum Note (Signed)
 Addended by: ANTONIETTE [name] CROME on: 01/29/2024 03:25 PM   Modules accepted: Level of Service

## 2024-02-03 ENCOUNTER — Other Ambulatory Visit: Payer: Self-pay | Admitting: Physician Assistant

## 2024-02-04 ENCOUNTER — Other Ambulatory Visit: Payer: Self-pay | Admitting: Physician Assistant

## 2024-02-10 ENCOUNTER — Other Ambulatory Visit: Payer: Self-pay | Admitting: Physician Assistant

## 2024-03-03 ENCOUNTER — Telehealth: Payer: Self-pay | Admitting: Podiatry

## 2024-03-03 NOTE — Telephone Encounter (Signed)
 Patient called and is wanting to know the expected time frame she will be non-weight bearing as she has two younger children she has to care for and her husband is wanting to return to work the Friday after her procedure. She is trying to find out if she will need to coordinate care from outside the home to assist with the children.

## 2024-03-09 ENCOUNTER — Encounter: Payer: Self-pay | Admitting: Podiatry

## 2024-03-09 ENCOUNTER — Other Ambulatory Visit: Payer: Self-pay | Admitting: Podiatry

## 2024-03-09 DIAGNOSIS — M205X2 Other deformities of toe(s) (acquired), left foot: Secondary | ICD-10-CM | POA: Diagnosis not present

## 2024-03-09 MED ORDER — ONDANSETRON HCL 4 MG PO TABS: 4.0000 mg | ORAL_TABLET | Freq: Three times a day (TID) | ORAL | 0 refills | Status: DC | PRN

## 2024-03-09 MED ORDER — TRAMADOL HCL 50 MG PO TABS: 50.0000 mg | ORAL_TABLET | Freq: Three times a day (TID) | ORAL | 0 refills | Status: DC | PRN

## 2024-03-09 MED ORDER — TRAMADOL HCL 50 MG PO TABS: 50.0000 mg | ORAL_TABLET | Freq: Three times a day (TID) | ORAL | 0 refills | Status: AC | PRN

## 2024-03-19 ENCOUNTER — Ambulatory Visit (INDEPENDENT_AMBULATORY_CARE_PROVIDER_SITE_OTHER): Admitting: Podiatry

## 2024-03-19 ENCOUNTER — Telehealth: Payer: Self-pay | Admitting: *Deleted

## 2024-03-19 ENCOUNTER — Encounter: Payer: Self-pay | Admitting: Podiatry

## 2024-03-19 ENCOUNTER — Ambulatory Visit

## 2024-03-19 VITALS — BP 76/57 | HR 107 | Temp 98.6°F

## 2024-03-19 DIAGNOSIS — M205X2 Other deformities of toe(s) (acquired), left foot: Secondary | ICD-10-CM | POA: Diagnosis not present

## 2024-03-19 DIAGNOSIS — Z9889 Other specified postprocedural states: Secondary | ICD-10-CM

## 2024-03-19 NOTE — Telephone Encounter (Signed)
 I attempted to call the patient.  I left her a message asking her to arrive around 11:20 am for her appointment and stop by imaging for xrays, first.  Then, come to us  for her appointment.

## 2024-03-19 NOTE — Progress Notes (Signed)
 "  Subjective:  Patient ID: Melissa Sharp, female    DOB: 03-11-1981,  MRN: [id number]  Chief Complaint  Patient presents with   Routine Post Op    POV #1 DOS 03/09/2024 LT CHEILECTOMY It's great, I haven't had any pain.  I've only taken one pain pill.  I have only been taking Ibuprofen .  My pain level is zero.  I was a little nauseous after the surgery and I vomited once but I haven't since then.    DOS: 03/12/24 Procedure: Left foot cheilectomy   44 y.o. female returns for POV#1. Relates doing well and managing pain   Review of Systems: Negative except as noted in the HPI. Denies N/V/F/Ch.  Past Medical History:  Diagnosis Date   Anxiety    Chalazion left upper eyelid 08/26/2018   Depression    Hypothyroidism    Migraine    PIH (pregnancy induced hypertension) 07/28/2015   Pneumonia due to COVID-19 virus 10/25/2020   Preeclampsia    Sinobronchitis 09/24/2019   Thyroid  activity decreased 10/27/2013   Current Medications[1]  Tobacco Use History[2]  Allergies[3] Objective:   Vitals:   03/19/24 1159  BP: (!) 76/57  Pulse: (!) 107  Temp: 98.6 F (37 C)   There is no height or weight on file to calculate BMI. Constitutional Well developed. Well nourished.  Vascular Foot warm and well perfused. Capillary refill normal to all digits.   Neurologic Normal speech. Oriented to person, place, and time. Epicritic sensation to light touch grossly present bilaterally.  Dermatologic Skin healing well without signs of infection. Skin edges well coapted without signs of infection.  Orthopedic: Tenderness to palpation noted about the surgical site.   Radiographs: Interval resection of dorsal aspect of first metatrsal head.  Assessment:   1. Hallux limitus, left   2. Post-operative state    Plan:  Patient was evaluated and treated and all questions answered.  S/p foot surgery left -Progressing as expected post-operatively. -WB Status: WBAT in CAM boot  -Sutures:  intact. -Medications: n/a -Foot redressed.  Return in 2 weeks for suture removal.   Return in about 2 weeks (around 04/02/2024) for post op.      [1]  Current Outpatient Medications:    albuterol  (VENTOLIN  HFA) 108 (90 Base) MCG/ACT inhaler, Inhale 2 puffs into the lungs every 6 (six) hours as needed for wheezing., Disp: 2 each, Rfl: 5   Atogepant  (QULIPTA ) 10 MG TABS, Take one tablet by mouth daily for migraine prevention., Disp: 90 tablet, Rfl: 1   buPROPion  (WELLBUTRIN  XL) 150 MG 24 hr tablet, Take 150 mg by mouth daily., Disp: , Rfl:    cariprazine (VRAYLAR) 1.5 MG capsule, Take 3 mg by mouth daily., Disp: , Rfl:    cyanocobalamin  (VITAMIN B12) 1000 MCG tablet, Take 1 tablet (1,000 mcg total) by mouth daily., Disp: 90 tablet, Rfl: 3   dicyclomine  (BENTYL ) 10 MG capsule, Take 1 capsule (10 mg total) by mouth 2 (two) times daily., Disp: 60 capsule, Rfl: 2   fluticasone  (FLONASE ) 50 MCG/ACT nasal spray, Place 2 sprays into both nostrils daily., Disp: 16 g, Rfl: 0   hydrochlorothiazide  (HYDRODIURIL ) 25 MG tablet, Take 1 tablet (25 mg total) by mouth daily., Disp: 90 tablet, Rfl: 4   hydrOXYzine (VISTARIL) 25 MG capsule, Take 25-50 mg by mouth at bedtime., Disp: , Rfl:    ipratropium (ATROVENT ) 0.06 % nasal spray, Place 2 sprays into both nostrils 4 (four) times daily., Disp: 15 mL, Rfl: 3   levothyroxine  (SYNTHROID )  75 MCG tablet, Take 1 tablet (75 mcg total) by mouth daily before breakfast., Disp: 90 tablet, Rfl: 4   montelukast  (SINGULAIR ) 10 MG tablet, Take 1 tablet (10 mg total) by mouth daily., Disp: 30 tablet, Rfl: 3   rizatriptan  (MAXALT -MLT) 10 MG disintegrating tablet, Take 1 tablet (10 mg total) by mouth as needed for migraine. May repeat in 2 hours if needed, Disp: 10 tablet, Rfl: 1   rosuvastatin  (CRESTOR ) 40 MG tablet, Take 1 tablet (40 mg total) by mouth daily., Disp: 90 tablet, Rfl: 4   spironolactone  (ALDACTONE ) 25 MG tablet, Take 1 tablet (25 mg total) by mouth daily., Disp:  90 tablet, Rfl: 1   traZODone  (DESYREL ) 100 MG tablet, Take 0.5 tablets (50 mg total) by mouth at bedtime as needed., Disp: , Rfl:    dapagliflozin  propanediol (FARXIGA ) 10 MG TABS tablet, Take 1 tablet (10 mg total) by mouth daily. (Patient not taking: Reported on 03/19/2024), Disp: 90 tablet, Rfl: 3   meloxicam  (MOBIC ) 15 MG tablet, Take 1 tablet (15 mg total) by mouth daily. (Patient not taking: Reported on 03/19/2024), Disp: 30 tablet, Rfl: 0   ondansetron  (ZOFRAN ) 4 MG tablet, Take 1 tablet (4 mg total) by mouth every 8 (eight) hours as needed for nausea or vomiting. (Patient not taking: Reported on 03/19/2024), Disp: 20 tablet, Rfl: 0 [2]  Social History Tobacco Use  Smoking Status Never  Smokeless Tobacco Never  [3]  Allergies Allergen Reactions   Codeine  Itching   Labetalol      Numbness and tingling/jerking/aphasia   Latex Hives and Itching   Hydrocodone  Itching   Lipitor [Atorvastatin ]     Hair loss   Nifedipine     Swelling/bloating   "

## 2024-03-27 ENCOUNTER — Other Ambulatory Visit: Payer: Self-pay | Admitting: Physician Assistant

## 2024-03-27 DIAGNOSIS — L659 Nonscarring hair loss, unspecified: Secondary | ICD-10-CM

## 2024-03-31 ENCOUNTER — Encounter: Admitting: Podiatry

## 2024-04-01 ENCOUNTER — Encounter: Payer: Self-pay | Admitting: Podiatry

## 2024-04-01 ENCOUNTER — Ambulatory Visit: Admitting: Podiatry

## 2024-04-01 DIAGNOSIS — Z9889 Other specified postprocedural states: Secondary | ICD-10-CM

## 2024-04-01 DIAGNOSIS — M205X2 Other deformities of toe(s) (acquired), left foot: Secondary | ICD-10-CM

## 2024-04-01 NOTE — Progress Notes (Signed)
 "  Subjective:  Patient ID: Melissa Sharp, female    DOB: 18-Jan-1981,  MRN: [id number]  Chief Complaint  Patient presents with   Routine Post Op    POV #2 DOS 03/09/2024 LT CHEILECTOMY It's good.  I had to run after the dog the other day and it hurt a little bit after that but it's fine today.    DOS: 03/12/24 Procedure: Left foot cheilectomy   44 y.o. female returns for POV#2. Relates doing well and managing pain   Review of Systems: Negative except as noted in the HPI. Denies N/V/F/Ch.  Past Medical History:  Diagnosis Date   Anxiety    Chalazion left upper eyelid 08/26/2018   Depression    Hypothyroidism    Migraine    PIH (pregnancy induced hypertension) 07/28/2015   Pneumonia due to COVID-19 virus 10/25/2020   Preeclampsia    Sinobronchitis 09/24/2019   Thyroid  activity decreased 10/27/2013   Current Medications[1]  Tobacco Use History[2]  Allergies[3] Objective:   There were no vitals filed for this visit.  There is no height or weight on file to calculate BMI. Constitutional Well developed. Well nourished.  Vascular Foot warm and well perfused. Capillary refill normal to all digits.   Neurologic Normal speech. Oriented to person, place, and time. Epicritic sensation to light touch grossly present bilaterally.  Dermatologic Skin healing well without signs of infection. Skin edges well coapted without signs of infection.  Orthopedic: Tenderness to palpation noted about the surgical site.   Radiographs: Interval resection of dorsal aspect of first metatrsal head.  Assessment:   1. Post-operative state   2. Hallux limitus, left    Plan:  Patient was evaluated and treated and all questions answered.  S/p foot surgery left -Progressing as expected post-operatively. -WB Status: WBAT in CAM boot  may begin transition to regular shoes.  -Sutures: removed today without incident.  -Medications: n/a -Foot redressed.  Return in 3 weeks for recheck    Return in about 3 weeks (around 04/22/2024) for post op.      [1]  Current Outpatient Medications:    albuterol  (VENTOLIN  HFA) 108 (90 Base) MCG/ACT inhaler, Inhale 2 puffs into the lungs every 6 (six) hours as needed for wheezing., Disp: 2 each, Rfl: 5   Atogepant  (QULIPTA ) 10 MG TABS, Take one tablet by mouth daily for migraine prevention., Disp: 90 tablet, Rfl: 1   buPROPion  (WELLBUTRIN  XL) 150 MG 24 hr tablet, Take 150 mg by mouth daily., Disp: , Rfl:    cariprazine (VRAYLAR) 1.5 MG capsule, Take 3 mg by mouth daily., Disp: , Rfl:    cyanocobalamin  (VITAMIN B12) 1000 MCG tablet, Take 1 tablet (1,000 mcg total) by mouth daily., Disp: 90 tablet, Rfl: 3   dapagliflozin  propanediol (FARXIGA ) 10 MG TABS tablet, Take 1 tablet (10 mg total) by mouth daily., Disp: 90 tablet, Rfl: 3   dicyclomine  (BENTYL ) 10 MG capsule, Take 1 capsule (10 mg total) by mouth 2 (two) times daily., Disp: 60 capsule, Rfl: 2   fluticasone  (FLONASE ) 50 MCG/ACT nasal spray, Place 2 sprays into both nostrils daily., Disp: 16 g, Rfl: 0   hydrochlorothiazide  (HYDRODIURIL ) 25 MG tablet, Take 1 tablet (25 mg total) by mouth daily., Disp: 90 tablet, Rfl: 4   hydrOXYzine (VISTARIL) 25 MG capsule, Take 25-50 mg by mouth at bedtime., Disp: , Rfl:    ipratropium (ATROVENT ) 0.06 % nasal spray, Place 2 sprays into both nostrils 4 (four) times daily., Disp: 15 mL, Rfl: 3   levothyroxine  (  SYNTHROID ) 75 MCG tablet, Take 1 tablet (75 mcg total) by mouth daily before breakfast., Disp: 90 tablet, Rfl: 4   montelukast  (SINGULAIR ) 10 MG tablet, Take 1 tablet (10 mg total) by mouth daily., Disp: 30 tablet, Rfl: 3   rizatriptan  (MAXALT -MLT) 10 MG disintegrating tablet, Take 1 tablet (10 mg total) by mouth as needed for migraine. May repeat in 2 hours if needed, Disp: 10 tablet, Rfl: 1   rosuvastatin  (CRESTOR ) 40 MG tablet, Take 1 tablet (40 mg total) by mouth daily., Disp: 90 tablet, Rfl: 4   spironolactone  (ALDACTONE ) 25 MG tablet, Take 1  tablet (25 mg total) by mouth daily., Disp: 90 tablet, Rfl: 1   traZODone  (DESYREL ) 100 MG tablet, Take 0.5 tablets (50 mg total) by mouth at bedtime as needed., Disp: , Rfl:    meloxicam  (MOBIC ) 15 MG tablet, Take 1 tablet (15 mg total) by mouth daily. (Patient not taking: Reported on 04/01/2024), Disp: 30 tablet, Rfl: 0   ondansetron  (ZOFRAN ) 4 MG tablet, Take 1 tablet (4 mg total) by mouth every 8 (eight) hours as needed for nausea or vomiting. (Patient not taking: Reported on 04/01/2024), Disp: 20 tablet, Rfl: 0 [2]  Social History Tobacco Use  Smoking Status Never  Smokeless Tobacco Never  [3]  Allergies Allergen Reactions   Codeine  Itching   Labetalol      Numbness and tingling/jerking/aphasia   Latex Hives and Itching   Hydrocodone  Itching   Lipitor [Atorvastatin ]     Hair loss   Nifedipine     Swelling/bloating   "

## 2024-04-06 ENCOUNTER — Other Ambulatory Visit: Payer: Self-pay | Admitting: Physician Assistant

## 2024-04-06 DIAGNOSIS — J302 Other seasonal allergic rhinitis: Secondary | ICD-10-CM

## 2024-04-06 DIAGNOSIS — J309 Allergic rhinitis, unspecified: Secondary | ICD-10-CM

## 2024-04-06 DIAGNOSIS — R051 Acute cough: Secondary | ICD-10-CM

## 2024-04-07 ENCOUNTER — Telehealth: Payer: Self-pay

## 2024-04-07 ENCOUNTER — Other Ambulatory Visit: Payer: Self-pay | Admitting: Physician Assistant

## 2024-04-07 MED ORDER — QULIPTA 10 MG PO TABS: ORAL_TABLET | ORAL | 1 refills | Status: DC

## 2024-04-07 NOTE — Telephone Encounter (Signed)
 Copied from CRM #[id number]. Topic: Clinical - Medication Refill >> Apr 07, 2024  9:55 AM Berwyn MATSU wrote: Medication: Atogepant  (QULIPTA ) 10 MG TABS   Has the patient contacted their pharmacy? Yes (Agent: If no, request that the patient contact the pharmacy for the refill. If patient does not wish to contact the pharmacy document the reason why and proceed with request.) (Agent: If yes, when and what did the pharmacy advise?)  This is the patient's preferred pharmacy:  [facility] [phone] - [place], [place] - [address] [place] [address] Phone: [phone] Fax: [phone]   Is this the correct pharmacy for this prescription? Yes If no, delete pharmacy and type the correct one.   Has the prescription been filled recently? Yes  Is the patient out of the medication? Yes  Has the patient been seen for an appointment in the last year OR does the patient have an upcoming appointment? Yes  Can we respond through MyChart? No   Agent: Please be advised that Rx refills may take up to 3 business days. We ask that you follow-up with your pharmacy.

## 2024-04-09 ENCOUNTER — Other Ambulatory Visit (HOSPITAL_COMMUNITY): Payer: Self-pay

## 2024-04-09 NOTE — Telephone Encounter (Signed)
 Copied from CRM #[id number]. Topic: Clinical - Medication Refill >> Apr 07, 2024  9:55 AM Berwyn MATSU wrote: Medication: Atogepant  (QULIPTA ) 10 MG TABS   Has the patient contacted their pharmacy? Yes (Agent: If no, request that the patient contact the pharmacy for the refill. If patient does not wish to contact the pharmacy document the reason why and proceed with request.) (Agent: If yes, when and what did the pharmacy advise?)  This is the patient's preferred pharmacy:  [phone] [place], [place] - [address] [place] [address] Phone: [phone] Fax: [phone]   Is this the correct pharmacy for this prescription? Yes If no, delete pharmacy and type the correct one.   Has the prescription been filled recently? Yes  Is the patient out of the medication? Yes  Has the patient been seen for an appointment in the last year OR does the patient have an upcoming appointment? Yes  Can we respond through MyChart? No   Agent: Please be advised that Rx refills may take up to 3 business days. We ask that you follow-up with your pharmacy.

## 2024-04-09 NOTE — Telephone Encounter (Signed)
 Pharmacy Patient Advocate Encounter   Received notification from Physician's Office that prior authorization for QULIPTA  is required/requested.   Insurance verification completed.   The patient is insured through [facility] MEDICARE.   Per test claim: Refill too soon. PA is not needed at this time. Medication was filled 04/08/24. Next eligible fill date is 04/30/24.

## 2024-04-09 NOTE — Telephone Encounter (Signed)
 Needs PA

## 2024-04-12 ENCOUNTER — Telehealth: Payer: Self-pay

## 2024-04-12 ENCOUNTER — Other Ambulatory Visit (HOSPITAL_COMMUNITY): Payer: Self-pay

## 2024-04-12 NOTE — Telephone Encounter (Signed)
 Copied from CRM #[id number]. Topic: Clinical - Prescription Issue >> Apr 12, 2024  2:21 PM Delon DASEN wrote: Reason for CRM: Atogepant  (QULIPTA ) 10 MG TABS- insurance not covering, need an alternative- [phone]

## 2024-04-12 NOTE — Telephone Encounter (Signed)
 Does this medication need a PA?

## 2024-04-12 NOTE — Telephone Encounter (Signed)
 Pharmacy Patient Advocate Encounter   Received notification from Physician's Office that prior authorization for QULIPTA  is required/requested.   Insurance verification completed.   The patient is insured through [facility] MEDICARE.   Per test claim: The current 30 day co-pay is, $689.19.  No PA needed at this time. This test claim was processed through [facility]- copay amounts may vary at other pharmacies due to pharmacy/plan contracts, or as the patient moves through the different stages of their insurance plan.    No PA needed, patient has a deductible to meet.

## 2024-04-13 NOTE — Telephone Encounter (Signed)
 Looks like deductible is high. Qulipta  is going to be expensive. We need to have a virtual to discuss options?

## 2024-04-14 ENCOUNTER — Telehealth (INDEPENDENT_AMBULATORY_CARE_PROVIDER_SITE_OTHER): Admitting: Physician Assistant

## 2024-04-14 ENCOUNTER — Telehealth: Payer: Self-pay

## 2024-04-14 DIAGNOSIS — G43009 Migraine without aura, not intractable, without status migrainosus: Secondary | ICD-10-CM

## 2024-04-14 MED ORDER — NURTEC 75 MG PO TBDP: 1.0000 | ORAL_TABLET | ORAL | 3 refills | Status: AC

## 2024-04-14 NOTE — Progress Notes (Signed)
..  Virtual Visit via Video Note  I connected with Melissa Sharp on 04/14/24 at  1:00 PM EST by a video enabled telemedicine application and verified that I am speaking with the correct person using two identifiers.  Location: Patient: car Provider: clinic  .SABRAParticipating in visit:  Patient: Melissa Sharp Provider: [name] Bologna PA-C Provider in training: Annitta Raw PA-S   I discussed the limitations of evaluation and management by telemedicine and the availability of in person appointments. The patient expressed understanding and agreed to proceed.  History of Present Illness: .Discussed the use of AI scribe software for clinical note transcription with the patient, who gave verbal consent to proceed.  History of Present Illness Melissa Sharp is a 44 year old female who presents for medication management of migraines.  Migraine headaches - Experiencing migraines, previously well controlled on Qulipta  with only two episodes in the last six months - Recent episodes were unexpected, as she had not been experiencing frequent migraines prior to that - No current visual disturbances or photophobia with migraines - History of visual disturbances during high school, but not currently - She went to pharmacy and in the new year cost was over 600 dollars and she cannot afford that  Migraine preventive therapy - Previously used Topamax for migraine prevention, which was effective but specific side effects she did not like mainly mood disturbances - Has not tried other preventive medications such as Nurtec, propranolol, or injectable treatments like Aimovig or Ajovy - Concerned about the cost of Qulipta  due to insurance coverage issues      Observations/Objective: No acute distress Normal mood and appearance    Assessment and Plan: .Diagnoses and all orders for this visit:  Migraine without aura and without status migrainosus, not intractable -     Rimegepant Sulfate (NURTEC) 75 MG  TBDP; Take 1 tablet (75 mg total) by mouth every other day. For migraine prevention.    Assessment & Plan Migraine headaches Well-controlled with Qulipta , but insurance no longer covers it. Topamax was ineffective due to mood changes. Considering Nurtec for prevention and acute treatment. Prefers not to use injectables. - Prescribed Nurtec every other day for prevention. - Advised Nurtec as needed for acute episodes, max one tablet in 24 hours. - Instructed to monitor affordability and report issues. - Follow up in 3-6 months as needed.      Follow Up Instructions:    I discussed the assessment and treatment plan with the patient. The patient was provided an opportunity to ask questions and all were answered. The patient agreed with the plan and demonstrated an understanding of the instructions.   The patient was advised to call back or seek an in-person evaluation if the symptoms worsen or if the condition fails to improve as anticipated.   Obinna Ehresman, PA-C

## 2024-04-14 NOTE — Telephone Encounter (Signed)
 Copied from CRM #[id number]. Topic: Clinical - Prescription Issue >> Apr 14, 2024  3:53 PM Rosaria E wrote: Reason for CRM: Pt called to report that this far too expensive for her, wants a call back to discuss alternative options.   Best contact [phone]

## 2024-04-14 NOTE — Telephone Encounter (Signed)
 Patient scheduled.

## 2024-04-14 NOTE — Telephone Encounter (Signed)
 Spoke with pharmacist - was told that nurtec cost was $862.64 due to deductible ( patient on medicare)  She states a Prior authorization was not needed.  Is there an alternative option for the patient?

## 2024-04-19 ENCOUNTER — Encounter: Admitting: Podiatry

## 2024-04-19 NOTE — Telephone Encounter (Signed)
 LM to return call.

## 2024-04-19 NOTE — Telephone Encounter (Signed)
 Copied from CRM #[id number]. Topic: Clinical - Medication Question >> Apr 16, 2024  1:37 PM Larissa RAMAN wrote: Reason for CRM: Patient requesting a callback from PCP/nurse to discuss preventive medications that are covered by her Medicare.

## 2024-04-20 MED ORDER — TOPIRAMATE 25 MG PO TABS: 25.0000 mg | ORAL_TABLET | Freq: Two times a day (BID) | ORAL | 1 refills | Status: AC

## 2024-04-20 NOTE — Telephone Encounter (Signed)
 Lets try topamax  25mg  twice a day for migraine prevention.

## 2024-04-21 NOTE — Telephone Encounter (Signed)
 Patient is aware of alternative sent in. Will call with any questions or concerns

## 2024-04-26 ENCOUNTER — Ambulatory Visit: Admitting: Podiatry

## 2025-01-26 ENCOUNTER — Encounter: Admitting: Physician Assistant
